# Patient Record
Sex: Female | Born: 1955
Health system: Southern US, Community
[De-identification: ages and names within clinical notes are randomized; demographics above are authoritative.]

## PROBLEM LIST (undated history)

## (undated) DIAGNOSIS — L719 Rosacea, unspecified: Secondary | ICD-10-CM

## (undated) DIAGNOSIS — Z8781 Personal history of (healed) traumatic fracture: Secondary | ICD-10-CM

## (undated) DIAGNOSIS — E669 Obesity, unspecified: Secondary | ICD-10-CM

## (undated) DIAGNOSIS — R011 Cardiac murmur, unspecified: Secondary | ICD-10-CM

## (undated) DIAGNOSIS — R7309 Other abnormal glucose: Secondary | ICD-10-CM

## (undated) DIAGNOSIS — M109 Gout, unspecified: Secondary | ICD-10-CM

## (undated) DIAGNOSIS — M7580 Other shoulder lesions, unspecified shoulder: Secondary | ICD-10-CM

## (undated) DIAGNOSIS — F419 Anxiety disorder, unspecified: Secondary | ICD-10-CM

## (undated) DIAGNOSIS — Z9889 Other specified postprocedural states: Secondary | ICD-10-CM

## (undated) DIAGNOSIS — Z8619 Personal history of other infectious and parasitic diseases: Secondary | ICD-10-CM

## (undated) DIAGNOSIS — R112 Nausea with vomiting, unspecified: Secondary | ICD-10-CM

## (undated) DIAGNOSIS — K5730 Diverticulosis of large intestine without perforation or abscess without bleeding: Secondary | ICD-10-CM

## (undated) DIAGNOSIS — L4052 Psoriatic arthritis mutilans: Secondary | ICD-10-CM

## (undated) DIAGNOSIS — T4145XA Adverse effect of unspecified anesthetic, initial encounter: Secondary | ICD-10-CM

## (undated) DIAGNOSIS — M1990 Unspecified osteoarthritis, unspecified site: Secondary | ICD-10-CM

## (undated) DIAGNOSIS — R51 Headache: Secondary | ICD-10-CM

## (undated) DIAGNOSIS — Z79899 Other long term (current) drug therapy: Secondary | ICD-10-CM

## (undated) DIAGNOSIS — I10 Essential (primary) hypertension: Secondary | ICD-10-CM

## (undated) DIAGNOSIS — M722 Plantar fascial fibromatosis: Secondary | ICD-10-CM

## (undated) DIAGNOSIS — E785 Hyperlipidemia, unspecified: Secondary | ICD-10-CM

## (undated) DIAGNOSIS — Z8719 Personal history of other diseases of the digestive system: Secondary | ICD-10-CM

## (undated) DIAGNOSIS — M84374A Stress fracture, right foot, initial encounter for fracture: Secondary | ICD-10-CM

## (undated) DIAGNOSIS — L408 Other psoriasis: Secondary | ICD-10-CM

## (undated) DIAGNOSIS — K589 Irritable bowel syndrome without diarrhea: Secondary | ICD-10-CM

## (undated) DIAGNOSIS — G44009 Cluster headache syndrome, unspecified, not intractable: Secondary | ICD-10-CM

## (undated) DIAGNOSIS — M26609 Unspecified temporomandibular joint disorder, unspecified side: Secondary | ICD-10-CM

## (undated) DIAGNOSIS — B029 Zoster without complications: Secondary | ICD-10-CM

## (undated) DIAGNOSIS — T8859XA Other complications of anesthesia, initial encounter: Secondary | ICD-10-CM

## (undated) DIAGNOSIS — G43909 Migraine, unspecified, not intractable, without status migrainosus: Secondary | ICD-10-CM

## (undated) DIAGNOSIS — M7710 Lateral epicondylitis, unspecified elbow: Secondary | ICD-10-CM

## (undated) DIAGNOSIS — R519 Headache, unspecified: Secondary | ICD-10-CM

## (undated) DIAGNOSIS — E039 Hypothyroidism, unspecified: Secondary | ICD-10-CM

## (undated) DIAGNOSIS — M48062 Spinal stenosis, lumbar region with neurogenic claudication: Secondary | ICD-10-CM

## (undated) HISTORY — DX: Personal history of other infectious and parasitic diseases: Z86.19

## (undated) HISTORY — DX: Diverticulosis of large intestine without perforation or abscess without bleeding: K57.30

## (undated) HISTORY — DX: Rosacea, unspecified: L71.9

## (undated) HISTORY — DX: Plantar fascial fibromatosis: M72.2

## (undated) HISTORY — DX: Hyperlipidemia, unspecified: E78.5

## (undated) HISTORY — DX: Irritable bowel syndrome, unspecified: K58.9

## (undated) HISTORY — PX: OTHER SURGICAL HISTORY: SHX169

## (undated) HISTORY — DX: Morbid (severe) obesity due to excess calories: E66.01

## (undated) HISTORY — DX: Obesity, unspecified: E66.9

## (undated) HISTORY — PX: ESOPHAGOGASTRODUODENOSCOPY: SHX1529

## (undated) HISTORY — DX: Essential (primary) hypertension: I10

## (undated) HISTORY — DX: Other shoulder lesions, unspecified shoulder: M75.80

## (undated) HISTORY — PX: STOMACH SURGERY: SHX791

## (undated) HISTORY — PX: ABDOMINAL HYSTERECTOMY: SHX81

## (undated) HISTORY — PX: CERVICAL DISC SURGERY: SHX588

## (undated) HISTORY — DX: Unspecified osteoarthritis, unspecified site: M19.90

## (undated) HISTORY — DX: Other abnormal glucose: R73.09

## (undated) HISTORY — DX: Unspecified temporomandibular joint disorder, unspecified side: M26.609

## (undated) HISTORY — PX: CARPAL TUNNEL RELEASE: SHX101

## (undated) HISTORY — DX: Gout, unspecified: M10.9

## (undated) HISTORY — DX: Other psoriasis: L40.8

## (undated) HISTORY — PX: CHOLECYSTECTOMY: SHX55

## (undated) HISTORY — DX: Cardiac murmur, unspecified: R01.1

## (undated) HISTORY — PX: OVARIAN CYST REMOVAL: SHX89

---

## 1996-09-25 LAB — HM SIGMOIDOSCOPY: HM Sigmoidoscopy: NORMAL

## 2002-09-25 HISTORY — PX: OOPHORECTOMY: SHX86

## 2003-09-26 HISTORY — PX: OTHER SURGICAL HISTORY: SHX169

## 2004-04-14 ENCOUNTER — Ambulatory Visit (HOSPITAL_COMMUNITY): Admission: RE | Admit: 2004-04-14 | Discharge: 2004-04-15 | Payer: Self-pay | Admitting: Neurological Surgery

## 2004-05-17 ENCOUNTER — Encounter: Admission: RE | Admit: 2004-05-17 | Discharge: 2004-05-17 | Payer: Self-pay | Admitting: Neurological Surgery

## 2004-07-18 ENCOUNTER — Encounter: Admission: RE | Admit: 2004-07-18 | Discharge: 2004-07-18 | Payer: Self-pay | Admitting: Neurological Surgery

## 2004-08-22 ENCOUNTER — Ambulatory Visit: Payer: Self-pay | Admitting: Obstetrics and Gynecology

## 2004-09-25 HISTORY — PX: NECK SURGERY: SHX720

## 2005-07-26 HISTORY — PX: LAPAROSCOPIC SUPRACERVICAL HYSTERECTOMY: SUR797

## 2005-08-09 ENCOUNTER — Other Ambulatory Visit: Payer: Self-pay

## 2005-08-14 ENCOUNTER — Ambulatory Visit: Payer: Self-pay | Admitting: Obstetrics and Gynecology

## 2005-12-20 ENCOUNTER — Ambulatory Visit: Payer: Self-pay | Admitting: Internal Medicine

## 2006-11-24 LAB — CONVERTED CEMR LAB: Pap Smear: NORMAL

## 2006-11-24 LAB — HM MAMMOGRAPHY: HM Mammogram: NORMAL

## 2006-12-28 ENCOUNTER — Encounter: Payer: Self-pay | Admitting: Family Medicine

## 2007-01-10 ENCOUNTER — Ambulatory Visit: Payer: Self-pay | Admitting: Obstetrics and Gynecology

## 2007-02-12 ENCOUNTER — Ambulatory Visit: Payer: Self-pay | Admitting: Family Medicine

## 2007-02-12 DIAGNOSIS — E785 Hyperlipidemia, unspecified: Secondary | ICD-10-CM | POA: Insufficient documentation

## 2007-02-12 DIAGNOSIS — E669 Obesity, unspecified: Secondary | ICD-10-CM

## 2007-02-12 DIAGNOSIS — H209 Unspecified iridocyclitis: Secondary | ICD-10-CM

## 2007-02-12 DIAGNOSIS — K5730 Diverticulosis of large intestine without perforation or abscess without bleeding: Secondary | ICD-10-CM | POA: Insufficient documentation

## 2007-02-12 DIAGNOSIS — L408 Other psoriasis: Secondary | ICD-10-CM | POA: Insufficient documentation

## 2007-02-12 DIAGNOSIS — K589 Irritable bowel syndrome without diarrhea: Secondary | ICD-10-CM

## 2007-02-12 DIAGNOSIS — L719 Rosacea, unspecified: Secondary | ICD-10-CM

## 2007-02-12 DIAGNOSIS — I10 Essential (primary) hypertension: Secondary | ICD-10-CM | POA: Insufficient documentation

## 2007-02-12 DIAGNOSIS — M1990 Unspecified osteoarthritis, unspecified site: Secondary | ICD-10-CM | POA: Insufficient documentation

## 2007-03-14 ENCOUNTER — Ambulatory Visit: Payer: Self-pay | Admitting: Family Medicine

## 2007-05-30 ENCOUNTER — Telehealth (INDEPENDENT_AMBULATORY_CARE_PROVIDER_SITE_OTHER): Payer: Self-pay | Admitting: *Deleted

## 2007-06-05 ENCOUNTER — Telehealth (INDEPENDENT_AMBULATORY_CARE_PROVIDER_SITE_OTHER): Payer: Self-pay | Admitting: *Deleted

## 2008-01-29 ENCOUNTER — Encounter: Payer: Self-pay | Admitting: Family Medicine

## 2008-02-18 ENCOUNTER — Ambulatory Visit: Payer: Self-pay | Admitting: Obstetrics and Gynecology

## 2008-04-29 ENCOUNTER — Ambulatory Visit: Payer: Self-pay | Admitting: Family Medicine

## 2008-05-07 ENCOUNTER — Encounter: Payer: Self-pay | Admitting: Family Medicine

## 2008-05-07 ENCOUNTER — Telehealth (INDEPENDENT_AMBULATORY_CARE_PROVIDER_SITE_OTHER): Payer: Self-pay | Admitting: *Deleted

## 2008-05-13 ENCOUNTER — Ambulatory Visit: Payer: Self-pay | Admitting: Family Medicine

## 2008-05-14 ENCOUNTER — Encounter: Payer: Self-pay | Admitting: Family Medicine

## 2008-06-30 ENCOUNTER — Ambulatory Visit: Payer: Self-pay | Admitting: Family Medicine

## 2008-07-07 ENCOUNTER — Encounter: Payer: Self-pay | Admitting: Family Medicine

## 2008-07-13 ENCOUNTER — Telehealth: Payer: Self-pay | Admitting: Family Medicine

## 2008-07-13 DIAGNOSIS — M79609 Pain in unspecified limb: Secondary | ICD-10-CM

## 2008-09-23 ENCOUNTER — Ambulatory Visit: Payer: Self-pay | Admitting: Family Medicine

## 2008-10-06 ENCOUNTER — Telehealth: Payer: Self-pay | Admitting: Family Medicine

## 2008-10-07 ENCOUNTER — Encounter: Payer: Self-pay | Admitting: Family Medicine

## 2009-03-01 ENCOUNTER — Ambulatory Visit: Payer: Self-pay | Admitting: Obstetrics and Gynecology

## 2009-03-04 ENCOUNTER — Telehealth: Payer: Self-pay | Admitting: Family Medicine

## 2009-05-28 ENCOUNTER — Ambulatory Visit: Payer: Self-pay | Admitting: Unknown Physician Specialty

## 2009-07-07 ENCOUNTER — Ambulatory Visit: Payer: Self-pay | Admitting: Family Medicine

## 2009-07-07 DIAGNOSIS — R739 Hyperglycemia, unspecified: Secondary | ICD-10-CM

## 2009-07-07 LAB — HM DIABETES FOOT EXAM

## 2009-09-08 ENCOUNTER — Ambulatory Visit: Payer: Self-pay | Admitting: Family Medicine

## 2009-09-13 ENCOUNTER — Telehealth: Payer: Self-pay | Admitting: Family Medicine

## 2009-10-21 ENCOUNTER — Telehealth: Payer: Self-pay | Admitting: Family Medicine

## 2010-01-03 ENCOUNTER — Encounter: Payer: Self-pay | Admitting: Family Medicine

## 2010-03-02 ENCOUNTER — Ambulatory Visit: Payer: Self-pay | Admitting: Obstetrics and Gynecology

## 2010-03-09 ENCOUNTER — Ambulatory Visit: Payer: Self-pay | Admitting: Family Medicine

## 2010-03-09 DIAGNOSIS — M109 Gout, unspecified: Secondary | ICD-10-CM

## 2010-03-15 ENCOUNTER — Encounter: Payer: Self-pay | Admitting: Family Medicine

## 2010-03-21 ENCOUNTER — Encounter: Payer: Self-pay | Admitting: Family Medicine

## 2010-04-06 ENCOUNTER — Ambulatory Visit: Payer: Self-pay | Admitting: Family Medicine

## 2010-04-22 ENCOUNTER — Ambulatory Visit: Payer: Self-pay | Admitting: Family Medicine

## 2010-07-28 ENCOUNTER — Ambulatory Visit: Payer: Self-pay | Admitting: Family Medicine

## 2010-07-28 DIAGNOSIS — J0100 Acute maxillary sinusitis, unspecified: Secondary | ICD-10-CM | POA: Insufficient documentation

## 2010-10-18 ENCOUNTER — Encounter: Payer: Self-pay | Admitting: Family Medicine

## 2010-10-26 NOTE — Assessment & Plan Note (Signed)
Summary: 6 MTH FU/CLE   Vital Signs:  Patient profile:   55 year old female Height:      64 inches Weight:      324.75 pounds BMI:     55.94 Temp:     97 degrees F oral Pulse rate:   76 / minute Pulse rhythm:   regular BP sitting:   118 / 78  (right arm) Cuff size:   large  Vitals Entered By: Lewanda Rife LPN (March 09, 2010 9:02 AM) CC: six month f/u   History of Present Illness: here for f/u of HTN and lipids and DM  has been doing fairly overall   was dx with gout in foot  was on prednisone and then indometh (with prilosec)- takes it on and off and sees Dr Gavin Potters today had another bout of foot pain and it was gout plus a stress fx  has OA and psoriatic arthritis   is on aldactone  uric acid - per pt was on "high side of nl )     wt is down 3 lb with bmi of 55  (had lost 8 and then gained back being immobile)  is getting back to the pool this week- that feels good   bp is 118/78  DM - consistently has AIC under 6   lipids due for check/ pt ref statin due to leg pain in past so not at goal for DM   Allergies: 1)  * Amitriptyline 2)  * Midrin  Past History:  Past Surgical History: Last updated: 02/12/2007 [facility]  supra cervical hyst 11/06 (fibroid tumor, and ovarian cyst)- one ovary left and cervix left c-s surgery 05 carpal tunnel (bilat)R oophrectomy 04 stomach sx years ago for wt loss, ruptured incision EGD 9 years ago, had esoph stricture, and swallowing study  Family History: Last updated: 04/29/2008 mother- arthritis, fibromyalgia, severe lymphedema, obese brother obese  father MI at 65, CAD, ?chol M aunt with ovarian cancer P aunt with breast ca all P uncles with CAD GF CVA HTN in a lot of family members GM, B DM  Social History: Last updated: 04/29/2008 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 Eyes:  Denies blurring and eye pain. CV:  Denies chest pain or discomfort, lightheadness, and palpitations. Resp:  Denies cough and wheezing. GI:  Denies abdominal pain, change in bowel habits, indigestion, and nausea. GU:  Denies dysuria and urinary frequency. MS:  Denies joint pain, joint redness, and joint swelling. Derm:  Denies lesion(s), poor wound healing, and rash. Neuro:  Denies numbness and tingling. Psych:  Denies anxiety and depression. Endo:  Denies excessive thirst and excessive urination. Heme:  Denies abnormal bruising and bleeding.  Physical Exam  General:  morbidly obese- but well appearing  Head:  normocephalic, atraumatic, and no abnormalities observed.   Eyes:  vision grossly intact, pupils equal, pupils round, and pupils reactive to light.   Mouth:  pharynx pink and moist.   Neck:  supple with full rom and no masses or thyromegally, no JVD or carotid bruit  Lungs:  Normal respiratory effort, chest expands symmetrically.  Lungs are clear to auscultation, no crackles or wheezes. Heart:  Normal rate and regular rhythm. S1 and S2 normal without gallop, murmur, click, rub or other extra sounds. Msk:  no swelling in R foot today poor rom knees  Pulses:  R and L carotid,radial,femoral,dorsalis pedis and posterior tibial pulses are full and equal bilaterally Extremities:  No clubbing, cyanosis, edema, or deformity noted with normal full range of motion of all joints.   Neurologic:  sensation intact to light touch, gait normal, and DTRs symmetrical and normal.   Skin:  Intact without suspicious lesions or rashes Cervical Nodes:  No lymphadenopathy noted Psych:  normal affect, talkative and pleasant    Impression &  Recommendations:  Problem # 1:  HYPERGLYCEMIA (ICD-790.29) Assessment Unchanged check AIC - may be up a bit due to prednisone  has been well controlled commended wt loss order for [facility] given disc healthy diet (low simple sugar/ choose complex carbs/ low sat fat) diet and exercise in detail  f/u 6 mo  Problem # 2:  HYPERLIPIDEMIA NEC/NOS (ICD-272.4) Assessment: Unchanged pt declines stating rev low sat fat diet  pt refuses to stop eating red meat- disc imp of this  labs ordered f/u 6 mo   Problem # 3:  HYPERTENSION, ESSENTIAL NOS (ICD-401.9) Assessment: Unchanged  good control  if she has to stop med for gout - will need to make another choice will disc with her rheum today  lab order given f/u 6 mo  Her updated medication list for this problem includes:    Aldactone 25 Mg Tabs (Spironolactone) .Marland Kitchen... 1 by mouth once daily  BP today: 118/78 Prior BP: 140/80 (09/08/2009)  Prior 10 Yr Risk Heart Disease: Not enough information (07/07/2009)  Problem # 4:  GOUT, UNSPECIFIED (ICD-274.9) Assessment: New recurrent in foot  takes indocin cautiously with prilosec for rheum f/u today may need to stop diuretic / may need proph agent for uric acid pt given handouts for gout and low purine diet today from [facility]   Complete Medication List: 1)  Doxycycline Hyclate 50 Mg Caps (Doxycycline hyclate) .... Take one by mouth two times a day as needed 2)  Clidinium-chlordiazepoxide Caps (Clidinium-chlordiazepoxide caps) .... Take one by mouth two times a day as needed , 5-2.5 mg 3)  Glucosamine-chondroitin Tabs (Glucosamine-chondroit-vit c-mn) .... Take by mouth as directed 4)  Temovate 0.05 % Crea (Clobetasol propionate) .... Daily to aff area as needed 5)  Aldactone 25 Mg Tabs (Spironolactone) .Marland Kitchen.. 1 by mouth once daily 6)  Glucometer  .... To check glucose once daily and as needed for diabetes 250.0 7)  Glucose Test Strips and Lancets  .... To check glucose once daily and as needed for  diabetes 250.0 8)  Alprazolam 0.5 Mg Tabs (Alprazolam) .... 1/2 to 1 by mouth up to two times a day as needed anxiety 9)  Cyclobenzaprine Hcl 10 Mg Tabs (Cyclobenzaprine hcl) .... Take one by mouth as needed 10)  Ultram 50 Mg Tabs (Tramadol hcl) .... Take 2 by mouth every 6 hours 11)  Aspirin 81 Mg Tbec (Aspirin) .Marland Kitchen.. 1 by mouth once daily with food 12)  Indomethasone ?mg  .... Take two by mouth daily 13)  Prilosec ?mg  .... Take 1 tablet by mouth once a day  Patient Instructions: 1)  ask Dr Gavin Potters if you need to stop your diuretic (fluid pill )-- spironolactone  2)  here is order for labs for [facility]  3)  I will update you when these come back  4)  keep working on healthy diet and exercise 5)  follow up with me in about 6 months  Prescriptions: ALPRAZOLAM 0.5 MG TABS (ALPRAZOLAM) 1/2 to 1 by mouth up to two times a day as needed anxiety  #30 x 0   Entered and Authorized by:   Judith Part MD   Signed by:   Judith Part MD on 03/09/2010   Method used:   Print then Give to Patient   RxID:   [id number]   Current Allergies (reviewed today): * AMITRIPTYLINE * MIDRIN

## 2010-10-26 NOTE — Miscellaneous (Signed)
Summary: Controlled Substance Agreement  Controlled Substance Agreement   Imported By: Lanelle Bal 03/18/2010 09:48:29  _____________________________________________________________________  External Attachment:    Type:   Image     Comment:   External Document

## 2010-10-26 NOTE — Assessment & Plan Note (Signed)
Summary: BLOOD PRESSURE CHECK/Miron Marxen  Nurse Visit   Vital Signs:  Patient profile:   55 year old female Temp:     97.9 degrees F Pulse rate:   76 / minute Pulse rhythm:   regular BP sitting:   138 / 96  (right arm) Cuff size:   regular  Vitals Entered By: Lewanda Rife LPN (April 06, 2010 8:50 AM) CC: Patient present today for blood pressure check at the request of the physician. Comments Relevant Hx: Patient present today for blood pressure check.  Rheumotologist stopped Spirolactone two weeks ago. Pt is not taking any med for BP. Pt has no complaints. BP initially was 144/94 regular cuff on lower arm and after waiting 10 mins rechecked BP and BP was 138/96 regular cuff lower arm. Rechecked BP with extra large cuff on upper rt arm BP was 134/96. Please advise. Lewanda Rife LPN  April 06, 2010 9:00 AM   Assessment and Plan:  Dr Milinda Antis said OK for pt to leave and send note to Dr Milinda Antis. Pt can be reached at [phone].Lewanda Rife LPN  April 06, 2010 9:13 AM   thanks for the update - I want to get her started on lisinopril for bp  px written on EMR for call in  if any side eff like dizziness or cough - let me know  plan another nurse visit for 2 weeks to check bp please 401.9  Patient notified as instructed by telephone. Medication phoned to [facility] as instructed. Nurse visit for BP check 401.9 scheduled 04/22/10 at 8:30am.[facility] LPN  April 06, 2010 5:15 PM   Allergies: 1)  ! Spironolactone 2)  * Amitriptyline 3)  * Midrin  Orders Added: 1)  Est. Patient Level I [65784] Prescriptions: LISINOPRIL 10 MG TABS (LISINOPRIL) 1 by mouth once daily in am  #30 x 11   Entered and Authorized by:   Judith Part MD   Signed by:   Lewanda Rife LPN on 69/62/9528   Method used:   Telephoned to ...       [facility]  [address] [phone] ([facility]ail[facility]       [address]       [place], [place]  [address]       Ph: [phone] or [phone]       Fax: [phone]   RxID:    [id number]   Appended Document: BLOOD PRESSURE CHECK/Shawnie Nicole Dr Milinda Antis Pt request a 3 month prescription with refills if she is going to stay on med she said it is much cheaper for her to get mail order. Pt request rx mailed to her home address. Pt did not want to wait until BP check for rx so she could get 3 month supply before this month called in to [facility] is gone.  Appended Document: BLOOD PRESSURE CHECK/Jamiaya Bina remind her there is always the risk if she gets 3 mo that we may still have to adjust or change it (hopefully not) printed in put in nurse in box for pickup  Patient notified as instructed by telephone. Pt said she would pick up rx when she comes for BP check on 04/22/10. Prescription left at front desk. Lewanda Rife LPN  April 07, 2010 2:39 PM   Clinical Lists Changes  Medications: Rx of LISINOPRIL 10 MG TABS (LISINOPRIL) 1 by mouth once daily in am;  #90 x 3;  Signed;  Entered by: [employer]  MD;  Authorized by: Judith Part MD;  Method used: Print then Give to Patient    Prescriptions: LISINOPRIL 10 MG TABS (LISINOPRIL) 1 by mouth once daily in am  #90 x 3   Entered and Authorized by:   Judith Part MD   Signed by:   Judith Part MD on 04/07/2010   Method used:   Print then Give to Patient   RxID:   [id number]

## 2010-10-26 NOTE — Progress Notes (Signed)
Summary: Rx for congestion and cough  Phone Note Call from Patient Call back at Work Phone [phone]   Caller: Patient Call For: Judith Part MD Summary of Call: Patient called stating that she would like someting called in if possible.  Had head and chest congestion about three weeks ago, and it has not completely gone away.  Has been taking Tylenol and Mucinex.  Going out of town in about a week and cannot be sick on this trip.  Symptoms not getting any better, but no worse either.  Doen't want this to turn into a sinus infection.  Please advise.  Uses [facility] Initial call taken by: Linde Gillis CMA Duncan Dull),  October 21, 2009 11:43 AM  Follow-up for Phone Call        I recommend mucinex (DM if cough)  nasal saline irrigation or netti pot  tylenol if fever or sinus pain if not improving - needs to be seen - esp if facial pain or fever   Follow-up by: Judith Part MD,  October 21, 2009 12:20 PM  Additional Follow-up for Phone Call Additional follow up Details #1::        Advised pt. Additional Follow-up by: Lowella Petties CMA,  October 21, 2009 12:32 PM

## 2010-10-26 NOTE — Assessment & Plan Note (Signed)
Summary: BP CHECK NURSE VISIT PER DR [id number]/WU  Nurse Visit   Vital Signs:  Patient profile:   55 year old female Temp:     97.5 degrees F oral Pulse rate:   68 / minute Pulse rhythm:   regular BP sitting:   124 / 82  (right arm) Cuff size:   regular  Vitals Entered By: Lewanda Rife LPN (April 22, 2010 9:11 AM) CC: Patient present today for blood pressure check at the request of Dr. Milinda Antis Comments Pt states she feels good and is having no side effects (no dizziness and no cough). No other complaints noted. Pt has been taking Lisinopril 10mg  once daily as instructed. Pt can be reached at [phone] with further inistructions.Please advise.    Allergies: 1)  ! Spironolactone 2)  * Amitriptyline 3)  * Midrin  Orders Added: 1)  Est. Patient Level I [95621]  Appended Document: BP CHECK NURSE VISIT PER DR [id number]/NG please adv her to continue that med- bp is good  Appended Document: BP CHECK NURSE VISIT PER DR EXBMW413.2/GM Patient Advised.

## 2010-10-26 NOTE — Miscellaneous (Signed)
Summary: update med list  Clinical Lists Changes  Medications: Removed medication of ALDACTONE 25 MG TABS (SPIRONOLACTONE) 1 by mouth once daily     Current Allergies: * AMITRIPTYLINE * MIDRIN

## 2010-10-26 NOTE — Letter (Signed)
Summary: [facility]  [facility]   Imported By: Lanelle Bal 03/18/2010 10:49:15  _____________________________________________________________________  External Attachment:    Type:   Image     Comment:   External Document  Appended Document: [facility] please let pt know I read her rheum note and the doctor wanted her to stop her spironolactone due to gout - please note this on EMR med list and have her please come in for bp check nurse visit in 2 weeks -- to see how bp is and decide what to do next   Appended Document: [facility] Patient notified as instructed by telephone. Med list updated. pt is at home and will call tomorrow when she has her schedule at work to schedule nurse visit in 2 weks.

## 2010-10-26 NOTE — Letter (Signed)
Summary: Letter Regarding Disease Mgmt Program/[employer] [facility]  Letter Regarding Disease Mgmt Program/[employer] [facility]   Imported By: Lanelle Bal 01/13/2010 08:30:39  _____________________________________________________________________  External Attachment:    Type:   Image     Comment:   External Document

## 2010-10-26 NOTE — Assessment & Plan Note (Signed)
Summary: ??sinus infection/alc   Vital Signs:  Patient profile:   55 year old female Height:      64 inches Weight:      343.25 pounds BMI:     59.13 Temp:     97.6 degrees F oral Pulse rate:   84 / minute Pulse rhythm:   regular BP sitting:   142 / 88  (left arm) Cuff size:   large  Vitals Entered By: Delilah Shan CMA Duncan Dull) (July 28, 2010 2:41 PM) CC: ? sinus infection, coughing   History of Present Illness: Start as a cold about 1 week ago.  Now with increase in ear "pulling", cough, congestion and "things settling in back on my throat."   No FCNAV.  Occ hot flashes as baseline.  Some sputum production, likely from post nasal gtt.  No doxy use recently (for rosacea).  Mult sick contacts.    Current Medications (verified): 1)  Doxycycline Hyclate 50 Mg Caps (Doxycycline Hyclate) .... Take One By Mouth Two Times A Day As Needed 2)  Clidinium-Chlordiazepoxide  Caps (Clidinium-Chlordiazepoxide Caps) .... Take One By Mouth Two Times A Day As Needed , 5-2.5 Mg 3)  Glucosamine-Chondroitin  Tabs (Glucosamine-Chondroit-Vit C-Mn) .... Take By Mouth As Directed 4)  Temovate 0.05 % Crea (Clobetasol Propionate) .... Daily To Aff Area As Needed 5)  Glucometer .... To Check Glucose Once Daily and As Needed For Diabetes 250.0 6)  Glucose Test Strips and Lancets .... To Check Glucose Once Daily and As Needed For Diabetes 250.0 7)  Alprazolam 0.5 Mg Tabs (Alprazolam) .... 1/2 To 1 By Mouth Up To Two Times A Day As Needed Anxiety 8)  Cyclobenzaprine Hcl 10 Mg Tabs (Cyclobenzaprine Hcl) .... Take One By Mouth As Needed 9)  Ultram 50 Mg Tabs (Tramadol Hcl) .... Take 2 By Mouth Every 6 Hours 10)  Aspirin 81 Mg Tbec (Aspirin) .Marland Kitchen.. 1 By Mouth Once Daily With Food 11)  Indomethasone ?mg .... Take Two By Mouth Daily 12)  Prilosec ?mg .... Take 1 Tablet By Mouth Once A Day 13)  Lisinopril 10 Mg Tabs (Lisinopril) .Marland Kitchen.. 1 By Mouth Once Daily in Am  Allergies: 1)  ! Spironolactone 2)  *  Amitriptyline 3)  * Midrin  Review of Systems       See HPI.  Otherwise negative.    Physical Exam  General:  GEN: nad, alert and oriented, obese HEENT: mucous membranes moist, TM w/o erythema, nasal epithelium injected, OP with cobblestoning, maxillary sinus tender to palpation bilaterally NECK: supple w/o LA CV: rrr. PULM: ctab, no inc wob ABD: soft, +bs EXT: no edema    Impression & Recommendations:  Problem # 1:  ACUTE MAXILLARY SINUSITIS (ICD-461.0) See instruction.  follow up as needed.  Her updated medication list for this problem includes:    Doxycycline Hyclate 50 Mg Caps (Doxycycline hyclate) .Marland Kitchen... Take one by mouth two times a day as needed    Amoxicillin 875 Mg Tabs (Amoxicillin) .Marland Kitchen... 1 by mouth two times a day  Complete Medication List: 1)  Doxycycline Hyclate 50 Mg Caps (Doxycycline hyclate) .... Take one by mouth two times a day as needed 2)  Clidinium-chlordiazepoxide Caps (Clidinium-chlordiazepoxide caps) .... Take one by mouth two times a day as needed , 5-2.5 mg 3)  Glucosamine-chondroitin Tabs (Glucosamine-chondroit-vit c-mn) .... Take by mouth as directed 4)  Temovate 0.05 % Crea (Clobetasol propionate) .... Daily to aff area as needed 5)  Glucometer  .... To check glucose once daily and  as needed for diabetes 250.0 6)  Glucose Test Strips and Lancets  .... To check glucose once daily and as needed for diabetes 250.0 7)  Alprazolam 0.5 Mg Tabs (Alprazolam) .... 1/2 to 1 by mouth up to two times a day as needed anxiety 8)  Cyclobenzaprine Hcl 10 Mg Tabs (Cyclobenzaprine hcl) .... Take one by mouth as needed 9)  Ultram 50 Mg Tabs (Tramadol hcl) .... Take 2 by mouth every 6 hours 10)  Aspirin 81 Mg Tbec (Aspirin) .Marland Kitchen.. 1 by mouth once daily with food 11)  Indomethasone ?mg  .... Take two by mouth daily 12)  Prilosec ?mg  .... Take 1 tablet by mouth once a day 13)  Lisinopril 10 Mg Tabs (Lisinopril) .Marland Kitchen.. 1 by mouth once daily in am 14)  Amoxicillin 875 Mg Tabs  (Amoxicillin) .Marland Kitchen.. 1 by mouth two times a day  Patient Instructions: 1)  I would use nasal saline and gargle with salt water.  Rest and take tylenol as needed.  Start the amoxicillin and hold the doxycycline in the meatnime.  This should gradually get better.  Take care.  Prescriptions: AMOXICILLIN 875 MG TABS (AMOXICILLIN) 1 by mouth two times a day  #20 x 0   Entered and Authorized by:   Crawford Givens MD   Signed by:   Crawford Givens MD on 07/28/2010   Method used:   [facility]        [facility]  [address] [phone][facility] (retail)       [address]       [place], [place]  [address]       Ph: [phone] or [phone]       Fax: [phone]   RxID:   [id number]    Orders Added: 1)  Est. Patient Level III [63875]    Current Allergies (reviewed today): ! SPIRONOLACTONE * AMITRIPTYLINE * MIDRIN

## 2010-11-01 ENCOUNTER — Encounter: Payer: Self-pay | Admitting: Family Medicine

## 2010-11-10 NOTE — Letter (Signed)
Summary: [facility] note  External Other   Imported By: Kassie Mends 10/28/2010 11:51:11  _____________________________________________________________________  External Attachment:    Type:   Image     Comment:   External Document

## 2010-11-22 NOTE — Letter (Signed)
Summary: [facility]   [facility]   Imported By: Kassie Mends 11/15/2010 10:22:14  _____________________________________________________________________  External Attachment:    Type:   Image     Comment:   External Document

## 2011-02-02 ENCOUNTER — Other Ambulatory Visit: Payer: Self-pay | Admitting: *Deleted

## 2011-02-02 MED ORDER — LISINOPRIL 10 MG PO TABS: 10.0000 mg | ORAL_TABLET | Freq: Every day | ORAL | Status: DC

## 2011-02-08 ENCOUNTER — Telehealth: Payer: Self-pay | Admitting: *Deleted

## 2011-02-08 NOTE — Telephone Encounter (Signed)
Letter done and in IN box 

## 2011-02-08 NOTE — Telephone Encounter (Signed)
Pt had fallen at work and had to take a drug test.  It showed positive for benzodiazepine.  Pt takes xanax. She is asking that you write a letter explaining that she has been prescribed this medicine.  She needs this asap, supposed to turn it in within 24 hours.

## 2011-02-08 NOTE — Telephone Encounter (Signed)
/  Patient notified as instructed by telephone. Pt said to fax to secure line at [phone] on 02/09/11 Thursday after 8 am. Pt will get the fax. If pt does not receive by 10am pt will call me back to let me know.

## 2011-02-09 NOTE — Telephone Encounter (Signed)
Spoke with pt and now is a good time to fax letter to [phone]. Letter faxed.

## 2011-02-10 NOTE — Op Note (Signed)
NAME:  Michelle Jordan, Michelle Jordan                         ACCOUNT NO.:  [id number]   MEDICAL RECORD NO.:  [id number]                   PATIENT TYPE:  OIB   LOCATION:  NA                                   FACILITY:  [facility]   PHYSICIAN:  Tia Alert, MD                  DATE OF BIRTH:  [birth year]   DATE OF PROCEDURE:  04/14/2004  DATE OF DISCHARGE:                                 OPERATIVE REPORT   PREOPERATIVE DIAGNOSIS:  Cervical spondylosis C5-6 with right C6  radiculopathy.   POSTOPERATIVE DIAGNOSIS:  Cervical spondylosis C5-6 with right C6  radiculopathy.   PROCEDURE:  Decompressive anterior cervical diskectomy for central canal  nerve root decompression.  Anterior cervical arthrodesis C5-6 utilizing a 7  mm Toutigen allograft.  Anterior cervical plating C5-6 utilizing a 22.5 mm  Zephyr anterior cervical plate.   SURGEON:  Tia Alert, M.D.   ASSISTANT:  Reinaldo Meeker, M.D.   ANESTHESIA:  General endotracheal.   COMPLICATIONS:  None apparent.   INDICATIONS FOR PROCEDURE:  The patient is a 55 year old white female who  presented to the neurosurgery clinic with complaints of neck pain with right  arm pain in a C6 distribution.  She had an MRI and a CT scan which showed  cervical spondylosis at C5-6 with narrowing of the right neuroforamen.  She  tried medical management for quite some time without significant relief.  I  recommended anterior cervical diskectomy and fusion with plating.  She  understood the risks, benefits, and alternatives and wished to proceed.   DESCRIPTION OF PROCEDURE:  The patient was taken to the operating room and  after induction of adequate general endotracheal anesthesia, she was placed  in a supine position on the operating table.  Her right anterior cervical  region was prepped with DuraPrep and draped in the usual sterile fashion.  3  mL of local anesthesia was injected and then an incision was made to the  right of the midline and carried down  to the platysma which was elevated,  opened, and undermined with Metzenbaum scissors and then dissected in a  plane medial to the sternocleidomastoid muscle and internal carotid artery  and lateral to the trachea and esophagus to expose the anterior cervical  spine.  Intraoperative fluoroscopy confirmed the level at C5-6 and then the  longus coli muscles were taken down bilaterally and the retractors were  placed under these to expose the anterior cervical spine at C5-6.  The  annulus was incised and the initial diskectomy was done with pituitary  rongeurs and curets.  Once we got down to the level of the posterior  longitudinal ligament, the operating microscope was brought into the field  and this was opened, and the posterior longitudinal ligament and posterior  osteophytes were removed in a circumferential fashion with the Kerrison  punches.  Bilateral foraminotomies were performed.  Because of right-sided  symptoms, we spent extra time on the neuroforamen and found the right C6  nerve root and followed it out into the foramen for quite some distance.  Once the decompression was complete, we dried bleeding in the surgical bed  with thrombin-soaked Gelfoam.  We then measured the interspace to be 7 mm  and used a 7 mm Toutigen allograft and tapped this into position in the  interspace at C5-6.  We then used a 22.5 mm Zephyr plate, placed two 13 mm  screws in the body of C5 and two in the body of C6 and locked these into  position with a sliding mechanism.  We then irrigated with saline solution  which contained bacitracin, dried all bleeding points with bipolar cautery  and once meticulous hemostasis was achieved, we closed the platysma with  interrupted 3-0 Vicryl, closed the subcuticular tissue with 3-0 Vicryl, and  closed the skin with Dermabond.  The drapes were removed, a sterile dressing  was applied.  The patient was awakened from general anesthesia and  transported to the recovery  room in stable condition.  At the end of the  procedure, all needle, sponge, and instrument counts correct.                                               Tia Alert, MD    DSJ/MEDQ  D:  04/14/2004  T:  04/14/2004  Job:  [id number]

## 2011-02-18 ENCOUNTER — Telehealth: Payer: Self-pay

## 2011-02-18 NOTE — Telephone Encounter (Signed)
Just for Dr Royden Purl information; pt did not want call back. Pt left v/m that the Benzo class false positive was caused by Chlordiaz for IBS. Thank you.

## 2011-03-30 ENCOUNTER — Ambulatory Visit: Payer: Self-pay | Admitting: Obstetrics and Gynecology

## 2011-07-04 ENCOUNTER — Other Ambulatory Visit: Payer: Self-pay | Admitting: Family Medicine

## 2011-07-27 ENCOUNTER — Encounter: Payer: Self-pay | Admitting: Family Medicine

## 2011-07-28 ENCOUNTER — Ambulatory Visit (INDEPENDENT_AMBULATORY_CARE_PROVIDER_SITE_OTHER): Payer: 59 | Admitting: Family Medicine

## 2011-07-28 ENCOUNTER — Encounter: Payer: Self-pay | Admitting: Family Medicine

## 2011-07-28 DIAGNOSIS — F411 Generalized anxiety disorder: Secondary | ICD-10-CM

## 2011-07-28 DIAGNOSIS — D179 Benign lipomatous neoplasm, unspecified: Secondary | ICD-10-CM | POA: Insufficient documentation

## 2011-07-28 DIAGNOSIS — R7309 Other abnormal glucose: Secondary | ICD-10-CM

## 2011-07-28 DIAGNOSIS — E785 Hyperlipidemia, unspecified: Secondary | ICD-10-CM

## 2011-07-28 DIAGNOSIS — F419 Anxiety disorder, unspecified: Secondary | ICD-10-CM | POA: Insufficient documentation

## 2011-07-28 DIAGNOSIS — L719 Rosacea, unspecified: Secondary | ICD-10-CM

## 2011-07-28 DIAGNOSIS — I10 Essential (primary) hypertension: Secondary | ICD-10-CM

## 2011-07-28 MED ORDER — PROMETHAZINE HCL 25 MG PO TABS: 25.0000 mg | ORAL_TABLET | Freq: Four times a day (QID) | ORAL | Status: DC | PRN

## 2011-07-28 MED ORDER — ALPRAZOLAM 0.5 MG PO TABS: 0.2500 mg | ORAL_TABLET | Freq: Two times a day (BID) | ORAL | Status: DC | PRN

## 2011-07-28 MED ORDER — LISINOPRIL 10 MG PO TABS: 10.0000 mg | ORAL_TABLET | Freq: Every day | ORAL | Status: DC

## 2011-07-28 MED ORDER — DOXYCYCLINE HYCLATE 50 MG PO CAPS: 50.0000 mg | ORAL_CAPSULE | Freq: Two times a day (BID) | ORAL | Status: DC | PRN

## 2011-07-28 MED ORDER — CILIDINIUM-CHLORDIAZEPOXIDE 2.5-5 MG PO CAPS: 1.0000 | ORAL_CAPSULE | Freq: Two times a day (BID) | ORAL | Status: DC | PRN

## 2011-07-28 NOTE — Patient Instructions (Addendum)
We will do a general surgeon referral at check out for area on neck  Keep working on weight loss and good exercise  meds refilled  I'm glad you got your flu shot

## 2011-07-28 NOTE — Progress Notes (Signed)
Subjective:    Patient ID: Michelle Jordan, female    DOB: December 13, 1955, 55 y.o.   MRN: [id number]  HPI Here for f/u of chronic medical problems incl HTN and hyperglycemia and hyper lipidemia and rosacea  Ok today Has been dx with psoriatic arthritis  is on methotrexate- and that helps along with occ cortisone shots - Dr Lavenia Atlas Also fell in may and ruptured bursa in her knee    Needs refil of phenergan Michelle Jordan takes this for migrane headaches for nausea - does not use often   Needs refil of xanax - - uses very sparingly for anxiety/ panic attack - uses about once per year  Much less frequent now    Flu shot- got that yesterday Michelle Jordan takes some vitamin C   Wt is down 4 lb with bmi of 58 On her scales - is down 19 lb since January  Is exercising some-- her pool is closed for the month for mt - trying to do something besides swimming ? Perhaps a bike  Does calesthenics in the water   124/76 Great bp  On ace  Is close to that at other offices  No cp or ha or edema   Labs at work on 06/23/11 Total 203 cholesterol with HDL 47  Glucose was 113 - post prandial a1c 5.3 - pleased with that   Lipoma on R neck is larger and causing pain and burning  Wants to get that checked out  Needs refil of rosacea med- doing very well overall  Stays out of sun and avoids hot beverages   Patient Active Problem List  Diagnoses  . HYPERLIPIDEMIA NEC/NOS  . GOUT, UNSPECIFIED  . OBESITY NOS  . IRITIS  . HYPERTENSION, ESSENTIAL NOS  . ACUTE MAXILLARY SINUSITIS  . DIVERTICULOSIS  . IBS  . ROSACEA  . PSORIASIS NEC  . OSTEOARTHROSIS NOS, UNSPECIFIED SITE  . Pain in Soft Tissues of Limb  . HYPERGLYCEMIA  . Lipoma  . Anxiety   Past Medical History  Diagnosis Date  . Diverticulosis of colon (without mention of hemorrhage)   . Gout, unspecified   . Other abnormal glucose   . Other and unspecified hyperlipidemia   . Unspecified essential hypertension   . Irritable bowel syndrome   .  Obesity, unspecified   . Osteoarthrosis, unspecified whether generalized or localized, unspecified site     knee  . Other psoriasis   . Rosacea   . Heart murmur     no prophylaxis--? valve  . Plantar fasciitis   . History of shingles   . TMJ (temporomandibular joint disorder)   . DMII (diabetes mellitus, type 2)     mild  . AC (acromioclavicular) joint bone spurs     feet   Past Surgical History  Procedure Date  . Cholecystectomy   . Laparoscopic supracervical hysterectomy 11/06    Fibroid tumor and ovarian cyst (one ovary and cervix left)  . C-s surgery 2005  . Carpal tunnel release     bilateral  . Oophorectomy 2004  . Stomach surgery years ago    for weight loss--ruptured incision  . Esophagogastroduodenoscopy     had esophageal stricture and swallowing study   History  Substance Use Topics  . Smoking status: Never Smoker   . Smokeless tobacco: Not on file  . Alcohol Use: Yes     Rare   Family History  Problem Relation Age of Onset  . Arthritis Mother   . Fibromyalgia Mother   .  Other Mother     Severe lymphedema  . Obesity Mother   . Obesity Brother   . Heart attack Father   . Coronary artery disease Father   . Ovarian cancer Maternal Aunt   . Breast cancer Paternal Aunt   . Coronary artery disease Paternal Uncle     all paternal uncles  . Stroke      GF  . Hypertension      a lot of family members  . Diabetes      GM  . Diabetes Brother    Allergies  Allergen Reactions  . Midrin     REACTION: caused tingling  . Spironolactone     REACTION: gout   Current Outpatient Prescriptions on File Prior to Visit  Medication Sig Dispense Refill  . aspirin 81 MG tablet Take 81 mg by mouth daily.        . clobetasol (TEMOVATE) 0.05 % cream Apply 1 application topically 2 (two) times daily as needed.        . traMADol (ULTRAM) 50 MG tablet Take 100 mg by mouth 2 (two) times daily. Maximum dose= 8 tablets per day      . glucosamine-chondroitin 500-400 MG  tablet Take 1 tablet by mouth 3 (three) times daily.        . Omeprazole (PRILOSEC PO) Take 1 tablet by mouth daily.              Review of Systems Review of Systems  Constitutional: Negative for fever, appetite change, fatigue and unexpected weight change.  Eyes: Negative for pain and visual disturbance.  Respiratory: Negative for cough and shortness of breath.   Cardiovascular: Negative for cp or palpitations    Gastrointestinal: Negative for nausea, diarrhea and constipation.  Genitourinary: Negative for urgency and frequency.  Skin: Negative for pallor or rash   Neurological: Negative for weakness, light-headedness, numbness and headaches.  Hematological: Negative for adenopathy. Does not bruise/bleed easily.  Psychiatric/Behavioral: Negative for dysphoric mood. The patient is not nervous/anxious.          Objective:   Physical Exam  Constitutional: Michelle Jordan appears well-developed and well-nourished. No distress.       Obese and well appearing   HENT:  Head: Normocephalic and atraumatic.  Mouth/Throat: Oropharynx is clear and moist.  Eyes: Conjunctivae and EOM are normal. Pupils are equal, round, and reactive to light. No scleral icterus.  Neck: Normal range of motion. Neck supple. No JVD present. Carotid bruit is not present. No thyromegaly present.  Cardiovascular: Normal rate, regular rhythm, normal heart sounds and intact distal pulses.  Exam reveals no gallop.   Pulmonary/Chest: Effort normal and breath sounds normal. No respiratory distress. Michelle Jordan has no wheezes.  Abdominal: Soft. Bowel sounds are normal. Michelle Jordan exhibits no distension, no abdominal bruit and no mass. There is no tenderness.  Musculoskeletal: Normal range of motion. Michelle Jordan exhibits no edema and no tenderness.  Lymphadenopathy:    Michelle Jordan has no cervical adenopathy.  Neurological: Michelle Jordan is alert. Michelle Jordan has normal reflexes. Coordination normal.  Skin: Skin is warm and dry. No rash noted. No erythema. No pallor.       R  lower neck/ upper back Large 3-4 cm area of soft induration - non tender Consistent with a lipoma   Psychiatric: Michelle Jordan has a normal mood and affect.          Assessment & Plan:

## 2011-07-30 NOTE — Assessment & Plan Note (Signed)
Very infrequent anx attacks- overall doing well Disc relaxation tech Refilled xanax for careful prn use

## 2011-07-30 NOTE — Assessment & Plan Note (Signed)
Refilled meds- vibrimycin- doing great  No changes Disc things wo avoid

## 2011-07-30 NOTE — Assessment & Plan Note (Signed)
bp in fair control at this time  No changes needed  Disc lifstyle change with low sodium diet and exercise   

## 2011-07-30 NOTE — Assessment & Plan Note (Signed)
Bothering her more now - in R neck area  Ref to gen surg for eval

## 2011-07-30 NOTE — Assessment & Plan Note (Signed)
Rev lipids- well controlled Disc goals for lipids and reasons to control them Rev labs with pt Rev low sat fat diet in detail

## 2011-07-30 NOTE — Assessment & Plan Note (Signed)
This is overall well controlled with diet a1c 5.9 Rev low glycemic diet and need for wt loss and exercise

## 2012-01-17 ENCOUNTER — Ambulatory Visit: Payer: Self-pay | Admitting: Specialist

## 2012-01-30 ENCOUNTER — Ambulatory Visit: Payer: Self-pay | Admitting: Specialist

## 2012-01-30 ENCOUNTER — Ambulatory Visit: Payer: Self-pay | Admitting: Bariatrics

## 2012-02-09 ENCOUNTER — Ambulatory Visit: Payer: Self-pay | Admitting: Specialist

## 2012-02-24 ENCOUNTER — Ambulatory Visit: Payer: Self-pay | Admitting: Specialist

## 2012-03-08 ENCOUNTER — Ambulatory Visit: Payer: Self-pay | Admitting: Specialist

## 2012-04-02 ENCOUNTER — Ambulatory Visit: Payer: Self-pay | Admitting: Obstetrics and Gynecology

## 2012-05-24 ENCOUNTER — Ambulatory Visit: Payer: Self-pay | Admitting: Specialist

## 2012-05-26 ENCOUNTER — Ambulatory Visit: Payer: Self-pay | Admitting: Specialist

## 2012-07-03 ENCOUNTER — Telehealth: Payer: Self-pay

## 2012-07-03 NOTE — Telephone Encounter (Signed)
Pt left v/m pt has appt already scheduled with Dr Milinda Antis 07/17/12; pt request written lab order for general labs including cholesterol faxed to [phone]; pt to have blood drawn next [facility] for another doctor and wants Dr Royden Purl labs done at same time. Pt is [employer] employee.Please advise.

## 2012-07-03 NOTE — Telephone Encounter (Signed)
Faxed over orders with a note on the coversheet to make sure they put results in [place]

## 2012-07-03 NOTE — Telephone Encounter (Signed)
Px is in IN box Please stress importance of [facility] putting results in epic-- I incl our epic number on the order thanks

## 2012-07-12 ENCOUNTER — Encounter: Payer: Self-pay | Admitting: Family Medicine

## 2012-07-17 ENCOUNTER — Ambulatory Visit: Payer: Self-pay | Admitting: Specialist

## 2012-07-17 ENCOUNTER — Ambulatory Visit (INDEPENDENT_AMBULATORY_CARE_PROVIDER_SITE_OTHER): Payer: 59 | Admitting: Family Medicine

## 2012-07-17 ENCOUNTER — Encounter: Payer: Self-pay | Admitting: Family Medicine

## 2012-07-17 VITALS — BP 116/82 | HR 82 | Temp 97.9°F | Ht 64.0 in | Wt 269.8 lb

## 2012-07-17 DIAGNOSIS — R7309 Other abnormal glucose: Secondary | ICD-10-CM

## 2012-07-17 DIAGNOSIS — F419 Anxiety disorder, unspecified: Secondary | ICD-10-CM

## 2012-07-17 DIAGNOSIS — F411 Generalized anxiety disorder: Secondary | ICD-10-CM

## 2012-07-17 DIAGNOSIS — Z23 Encounter for immunization: Secondary | ICD-10-CM

## 2012-07-17 DIAGNOSIS — E785 Hyperlipidemia, unspecified: Secondary | ICD-10-CM

## 2012-07-17 DIAGNOSIS — I10 Essential (primary) hypertension: Secondary | ICD-10-CM

## 2012-07-17 MED ORDER — ALPRAZOLAM 0.5 MG PO TABS: 0.5000 mg | ORAL_TABLET | Freq: Every evening | ORAL | Status: DC | PRN

## 2012-07-17 NOTE — Progress Notes (Signed)
Subjective:    Patient ID: Michelle Jordan, female    DOB: 06/27/1956, 56 y.o.   MRN: [id number]  HPI Here for f/u of chronic health conditions   Has to see ENT for perforated septum- poss rheum related , will not do surgery yet , also cysts in sinuses  Stays stuffy and her nose whistles Is also on cpap - and lowered her pressure recently- hopes to get off of it  Using netti pot  Had nl allergy tests  Trouble sleeping at night  Takes xanax very infrequently - needs refil (she needs to sleep when she gets congestion)      Wt is down 70 lb Had gastric sleeve procedure in august   Was having a lot of joint problems  No longer on nsaid and does not miss it   Is feeling good  Exercises - water exercise - jogging and kickboarding  Sees nutritionist  Is sticking to her plan   Is eating lean protein  Not much fat  Does eat red meat  Does eat cheese Has been eating shrimp   bp is stable today  No cp or palpitations or headaches or edema  No side effects to medicines  BP Readings from Last 3 Encounters:  07/17/12 116/82  07/28/11 124/76  07/28/10 142/88      Hyperglycemia -- 5.4 a1c  Very good control   Is off estrogen  Doing ok with that so far  Mood has been very good   Still sees Rheum-- Dr Gavin Potters   Patient Active Problem List  Diagnosis  . HYPERLIPIDEMIA NEC/NOS  . GOUT, UNSPECIFIED  . OBESITY NOS  . IRITIS  . HYPERTENSION, ESSENTIAL NOS  . ACUTE MAXILLARY SINUSITIS  . DIVERTICULOSIS  . IBS  . ROSACEA  . PSORIASIS NEC  . OSTEOARTHROSIS NOS, UNSPECIFIED SITE  . Pain in Soft Tissues of Limb  . HYPERGLYCEMIA  . Lipoma  . Anxiety   Past Medical History  Diagnosis Date  . Diverticulosis of colon (without mention of hemorrhage)   . Gout, unspecified   . Other abnormal glucose   . Other and unspecified hyperlipidemia   . Unspecified essential hypertension   . Irritable bowel syndrome   . Obesity, unspecified   . Osteoarthrosis, unspecified whether  generalized or localized, unspecified site     knee  . Other psoriasis   . Rosacea   . Heart murmur     no prophylaxis--? valve  . Plantar fasciitis   . History of shingles   . TMJ (temporomandibular joint disorder)   . DMII (diabetes mellitus, type 2)     mild  . AC (acromioclavicular) joint bone spurs     feet   Past Surgical History  Procedure Date  . Cholecystectomy   . Laparoscopic supracervical hysterectomy 11/06    Fibroid tumor and ovarian cyst (one ovary and cervix left)  . C-s surgery 2005  . Carpal tunnel release     bilateral  . Oophorectomy 2004  . Stomach surgery years ago    for weight loss--ruptured incision  . Esophagogastroduodenoscopy     had esophageal stricture and swallowing study   History  Substance Use Topics  . Smoking status: Never Smoker   . Smokeless tobacco: Not on file  . Alcohol Use: No   Family History  Problem Relation Age of Onset  . Arthritis Mother   . Fibromyalgia Mother   . Other Mother     Severe lymphedema  . Obesity Mother   .  Obesity Brother   . Heart attack Father   . Coronary artery disease Father   . Ovarian cancer Maternal Aunt   . Breast cancer Paternal Aunt   . Coronary artery disease Paternal Uncle     all paternal uncles  . Stroke      GF  . Hypertension      a lot of family members  . Diabetes      GM  . Diabetes Brother    Allergies  Allergen Reactions  . Isometheptene-Apap-Dichloral     REACTION: caused tingling  . Spironolactone     REACTION: gout   Current Outpatient Prescriptions on File Prior to Visit  Medication Sig Dispense Refill  . clidinium-chlordiazePOXIDE (LIBRAX) 2.5-5 MG per capsule Take 1 capsule by mouth 2 (two) times daily as needed.  180 capsule  3  . clobetasol (TEMOVATE) 0.05 % cream Apply 1 application topically 2 (two) times daily as needed.        . doxycycline (VIBRAMYCIN) 50 MG capsule Take 1 capsule (50 mg total) by mouth 2 (two) times daily as needed.  180 capsule  3  .  folic acid (FOLVITE) 1 MG tablet Take 1 mg by mouth daily.        Marland Kitchen lisinopril (PRINIVIL,ZESTRIL) 10 MG tablet Take 1 tablet (10 mg total) by mouth daily.  90 tablet  3  . methotrexate (RHEUMATREX) 2.5 MG tablet Take 2.5 mg by mouth once a week. Caution:Chemotherapy. Protect from light.  9 pills once weekly      . promethazine (PHENERGAN) 25 MG tablet Take 1 tablet (25 mg total) by mouth every 6 (six) hours as needed.  30 tablet  0  . traMADol (ULTRAM) 50 MG tablet Take 100 mg by mouth 2 (two) times daily. Maximum dose= 8 tablets per day      . ALPRAZolam (XANAX) 0.5 MG tablet Take 0.5-1 tablets (0.25-0.5 mg total) by mouth 2 (two) times daily as needed for anxiety.  30 tablet  0      Review of Systems Review of Systems  Constitutional: Negative for fever, appetite change, fatigue and unexpected weight change.  Eyes: Negative for pain and visual disturbance.  Respiratory: Negative for cough and shortness of breath.   Cardiovascular: Negative for cp or palpitations    Gastrointestinal: Negative for nausea, diarrhea and constipation.  Genitourinary: Negative for urgency and frequency.  Skin: Negative for pallor or rash   Neurological: Negative for weakness, light-headedness, numbness and headaches.  Hematological: Negative for adenopathy. Does not bruise/bleed easily.  Psychiatric/Behavioral: Negative for dysphoric mood. The patient is not nervous/anxious.         Objective:   Physical Exam  Constitutional: She appears well-developed and well-nourished. No distress.       Weight loss noted   HENT:  Head: Normocephalic and atraumatic.  Mouth/Throat: Oropharynx is clear and moist.  Eyes: Conjunctivae normal and EOM are normal. Pupils are equal, round, and reactive to light. Right eye exhibits no discharge. Left eye exhibits no discharge. No scleral icterus.  Neck: Normal range of motion. Neck supple. No JVD present. Carotid bruit is not present. No thyromegaly present.  Cardiovascular:  Normal rate, regular rhythm, normal heart sounds and intact distal pulses.  Exam reveals no gallop.   Pulmonary/Chest: Effort normal and breath sounds normal. No respiratory distress. She has no wheezes.  Abdominal: Soft. Bowel sounds are normal. She exhibits no distension, no abdominal bruit and no mass. There is no tenderness.  Musculoskeletal: She exhibits no  edema and no tenderness.  Lymphadenopathy:    She has no cervical adenopathy.  Neurological: She is alert. She has normal reflexes. No cranial nerve deficit. She exhibits normal muscle tone. Coordination normal.  Skin: Skin is warm and dry. No rash noted. No erythema. No pallor.  Psychiatric: She has a normal mood and affect.          Assessment & Plan:

## 2012-07-17 NOTE — Patient Instructions (Addendum)
Flu shot today  Take xanax sparingly only when you need it  Keep working on weight loss - good job! Avoid red meat/ fried foods/ egg yolks/ fatty breakfast meats/ butter, cheese and high fat dairy/ and shellfish   Schedule fasting lab for 6 months for cholesterol

## 2012-07-18 NOTE — Assessment & Plan Note (Signed)
a1c is below 6- very good Will continue to work on wt loss and expect further improvement

## 2012-07-18 NOTE — Assessment & Plan Note (Signed)
bp in fair control at this time  No changes needed  Disc lifstyle change with low sodium diet and exercise   Expect continued improvement with exercise    Chemistry

## 2012-07-18 NOTE — Assessment & Plan Note (Signed)
Refilled xanax for infrequent cautious use If symptoms worsen may need to disc a non habit forming daily tx

## 2012-07-18 NOTE — Assessment & Plan Note (Signed)
Future labs planned  [facility] for continued improvement with better diet

## 2012-07-26 ENCOUNTER — Ambulatory Visit: Payer: Self-pay | Admitting: Specialist

## 2012-10-01 ENCOUNTER — Other Ambulatory Visit: Payer: Self-pay | Admitting: *Deleted

## 2012-10-01 MED ORDER — LISINOPRIL 10 MG PO TABS: 10.0000 mg | ORAL_TABLET | Freq: Every day | ORAL | Status: DC

## 2013-03-25 ENCOUNTER — Encounter: Payer: Self-pay | Admitting: Radiology

## 2013-03-26 ENCOUNTER — Encounter: Payer: Self-pay | Admitting: Family Medicine

## 2013-03-26 ENCOUNTER — Ambulatory Visit (INDEPENDENT_AMBULATORY_CARE_PROVIDER_SITE_OTHER): Payer: 59 | Admitting: Family Medicine

## 2013-03-26 VITALS — BP 136/80 | HR 65 | Temp 98.1°F | Ht 64.0 in

## 2013-03-26 DIAGNOSIS — I10 Essential (primary) hypertension: Secondary | ICD-10-CM

## 2013-03-26 NOTE — Progress Notes (Signed)
Subjective:    Patient ID: Michelle Jordan, female    DOB: 1955/11/21, 57 y.o.   MRN: [id number]  HPI Is here for f/u of HTN   Is loosing wt and also inches  Lost 97 lb so far - bariatric surgery Feels very very good  Even her pain is better - gets to lower her methotrexate   No problems at all  Exercising and dancing  (line dancing and shagging)  Also swimming   Quality of life has changed so much   bp is stable today  No cp or palpitations or headaches or edema  No side effects to medicines  BP Readings from Last 3 Encounters:  03/26/13 136/80  07/17/12 116/82  07/28/11 124/76     Using home bp cuff low 100s-one teens/ over 60s-70s  When bp is on the low side she has had a few episodes of dizziness - when she stand up quickly- thinks that could also have sinus issues  bp is higher in the office than it is at home  At other doctor offices - her bp 120-130/70s usually   Here 120/80 our cuff 103/61 her cuff   Uses a wrist cuff   Uses xanax and librax very very rarely - not needing it  No phenergan lately She takes tramadol 2 per day and plans to wean off   Has to do toxicology screen today    Patient Active Problem List   Diagnosis Date Noted  . Lipoma 07/28/2011  . Anxiety 07/28/2011  . ACUTE MAXILLARY SINUSITIS 07/28/2010  . GOUT, UNSPECIFIED 03/09/2010  . HYPERGLYCEMIA 07/07/2009  . Pain in Soft Tissues of Limb 07/13/2008  . HYPERLIPIDEMIA NEC/NOS 02/12/2007  . OBESITY NOS 02/12/2007  . IRITIS 02/12/2007  . HYPERTENSION, ESSENTIAL NOS 02/12/2007  . DIVERTICULOSIS 02/12/2007  . IBS 02/12/2007  . ROSACEA 02/12/2007  . PSORIASIS NEC 02/12/2007  . OSTEOARTHROSIS NOS, UNSPECIFIED SITE 02/12/2007   Past Medical History  Diagnosis Date  . Diverticulosis of colon (without mention of hemorrhage)   . Gout, unspecified   . Other abnormal glucose   . Other and unspecified hyperlipidemia   . Unspecified essential hypertension   . Irritable bowel syndrome   .  Obesity, unspecified   . Osteoarthrosis, unspecified whether generalized or localized, unspecified site     knee  . Other psoriasis   . Rosacea   . Heart murmur     no prophylaxis--? valve  . Plantar fasciitis   . History of shingles   . TMJ (temporomandibular joint disorder)   . DMII (diabetes mellitus, type 2)     mild  . AC (acromioclavicular) joint bone spurs     feet   Past Surgical History  Procedure Laterality Date  . Cholecystectomy    . Laparoscopic supracervical hysterectomy  11/06    Fibroid tumor and ovarian cyst (one ovary and cervix left)  . C-s surgery  2005  . Carpal tunnel release      bilateral  . Oophorectomy  2004  . Stomach surgery  years ago    for weight loss--ruptured incision  . Esophagogastroduodenoscopy      had esophageal stricture and swallowing study   History  Substance Use Topics  . Smoking status: Never Smoker   . Smokeless tobacco: Not on file  . Alcohol Use: No   Family History  Problem Relation Age of Onset  . Arthritis Mother   . Fibromyalgia Mother   . Other Mother     Severe lymphedema  .  Obesity Mother   . Obesity Brother   . Heart attack Father   . Coronary artery disease Father   . Ovarian cancer Maternal Aunt   . Breast cancer Paternal Aunt   . Coronary artery disease Paternal Uncle     all paternal uncles  . Stroke      GF  . Hypertension      a lot of family members  . Diabetes      GM  . Diabetes Brother    Allergies  Allergen Reactions  . Isometheptene-Apap-Dichloral     REACTION: caused tingling  . Spironolactone     REACTION: gout   Current Outpatient Prescriptions on File Prior to Visit  Medication Sig Dispense Refill  . ALPRAZolam (XANAX) 0.5 MG tablet Take 1 tablet (0.5 mg total) by mouth at bedtime as needed for sleep or anxiety.  30 tablet  0  . b complex vitamins capsule Take 1 capsule by mouth daily.      . Biotin 5000 MCG CAPS Take 1 capsule by mouth daily.      . clidinium-chlordiazePOXIDE  (LIBRAX) 2.5-5 MG per capsule Take 1 capsule by mouth 2 (two) times daily as needed.  180 capsule  3  . clobetasol (TEMOVATE) 0.05 % cream Apply 1 application topically 2 (two) times daily as needed.        . doxycycline (VIBRAMYCIN) 50 MG capsule Take 1 capsule (50 mg total) by mouth 2 (two) times daily as needed.  180 capsule  3  . folic acid (FOLVITE) 1 MG tablet Take 1 mg by mouth daily.        Marland Kitchen lisinopril (PRINIVIL,ZESTRIL) 10 MG tablet Take 1 tablet (10 mg total) by mouth daily.  90 tablet  1  . methotrexate (RHEUMATREX) 2.5 MG tablet Take 2.5 mg by mouth once a week. Caution:Chemotherapy. Protect from light.  5 pills once weekly      . Multiple Vitamin (MULTIVITAMIN) capsule Take 1 capsule by mouth daily.      . promethazine (PHENERGAN) 25 MG tablet Take 1 tablet (25 mg total) by mouth every 6 (six) hours as needed.  30 tablet  0  . traMADol (ULTRAM) 50 MG tablet Take 100 mg by mouth 2 (two) times daily. Maximum dose= 8 tablets per day      . vitamin B-12 (CYANOCOBALAMIN) 1000 MCG tablet Take 1,000 mcg by mouth daily.       No current facility-administered medications on file prior to visit.    Review of Systems Review of Systems  Constitutional: Negative for fever, appetite change, fatigue and unexpected weight change.  Eyes: Negative for pain and visual disturbance.  Respiratory: Negative for cough and shortness of breath.   Cardiovascular: Negative for cp or palpitations    Gastrointestinal: Negative for nausea, diarrhea and constipation.  Genitourinary: Negative for urgency and frequency.  Skin: Negative for pallor or rash   Neurological: Negative for weakness, light-headedness, numbness and headaches.  Hematological: Negative for adenopathy. Does not bruise/bleed easily.  Psychiatric/Behavioral: Negative for dysphoric mood. The patient is not nervous/anxious.         Objective:   Physical Exam  Constitutional: She appears well-developed and well-nourished. No distress.   obese and well appearing  Large weight loss noted, however  HENT:  Head: Normocephalic and atraumatic.  Mouth/Throat: Oropharynx is clear and moist.  Eyes: Conjunctivae and EOM are normal. Pupils are equal, round, and reactive to light.  Neck: Normal range of motion. Neck supple. No JVD present. Carotid  bruit is not present. No thyromegaly present.  Cardiovascular: Normal rate, regular rhythm, normal heart sounds and intact distal pulses.  Exam reveals no gallop.   Pulmonary/Chest: Effort normal and breath sounds normal. No respiratory distress. She has no wheezes. She exhibits no tenderness.  Abdominal: Soft. Bowel sounds are normal. She exhibits no distension and no abdominal bruit. There is no tenderness.  Musculoskeletal: She exhibits no edema.  Lymphadenopathy:    She has no cervical adenopathy.  Neurological: She is alert. She has normal reflexes.  Skin: Skin is warm and dry. No pallor.  Psychiatric: She has a normal mood and affect.          Assessment & Plan:

## 2013-03-26 NOTE — Patient Instructions (Addendum)
The bp cuff I recommend is OMRON for arm size regular  Hold your lisinopril  Watch bp and it goes over 140 (top) or 90 (bottom) re start the medicine and let me know  Labs today  Keep taking good care of yourself

## 2013-03-28 LAB — COMPREHENSIVE METABOLIC PANEL
AST: 15 IU/L (ref 0–40)
Albumin/Globulin Ratio: 1.7 (ref 1.1–2.5)
Albumin: 4 g/dL (ref 3.5–5.5)
Alkaline Phosphatase: 80 IU/L (ref 39–117)
BUN/Creatinine Ratio: 30 — ABNORMAL HIGH (ref 9–23)
BUN: 22 mg/dL (ref 6–24)
Creatinine, Ser: 0.73 mg/dL (ref 0.57–1.00)
GFR calc Af Amer: 107 mL/min/{1.73_m2} (ref >59)
GFR calc non Af Amer: 93 mL/min/{1.73_m2} (ref >59)
Globulin, Total: 2.4 g/dL (ref 1.5–4.5)
Sodium: 142 mmol/L (ref 134–144)
Total Bilirubin: 0.2 mg/dL (ref 0.0–1.2)

## 2013-03-28 LAB — LIPID PANEL
Chol/HDL Ratio: 3.8 ratio units (ref 0.0–4.4)
LDL Calculated: 131 mg/dL — ABNORMAL HIGH (ref 0–99)
Triglycerides: 70 mg/dL (ref 0–149)

## 2013-03-31 ENCOUNTER — Encounter: Payer: Self-pay | Admitting: *Deleted

## 2013-04-08 ENCOUNTER — Encounter: Payer: Self-pay | Admitting: Family Medicine

## 2013-06-30 ENCOUNTER — Ambulatory Visit: Payer: Self-pay | Admitting: Family Medicine

## 2013-06-30 ENCOUNTER — Encounter: Payer: Self-pay | Admitting: Family Medicine

## 2013-07-24 ENCOUNTER — Other Ambulatory Visit: Payer: Self-pay | Admitting: Family Medicine

## 2013-07-24 NOTE — Telephone Encounter (Signed)
Fax refill request, please advise  

## 2013-07-27 MED ORDER — DOXYCYCLINE HYCLATE 50 MG PO CAPS: 50.0000 mg | ORAL_CAPSULE | Freq: Two times a day (BID) | ORAL | Status: DC | PRN

## 2013-07-27 NOTE — Telephone Encounter (Signed)
I refilled electronically 

## 2013-07-31 ENCOUNTER — Other Ambulatory Visit: Payer: Self-pay

## 2013-11-25 ENCOUNTER — Telehealth: Payer: Self-pay | Admitting: Family Medicine

## 2013-11-25 MED ORDER — ALPRAZOLAM 0.5 MG PO TABS: 0.5000 mg | ORAL_TABLET | Freq: Every evening | ORAL | Status: DC | PRN

## 2013-11-25 NOTE — Telephone Encounter (Signed)
I'm sorry to hear that  Please call in xanax - and if she needs it for more than a month please follow up

## 2013-11-25 NOTE — Telephone Encounter (Signed)
Rx called in as prescribed ([facility]), pt notified and advise to f/u if no improvement in a month, pt verbalized understanding

## 2013-11-25 NOTE — Telephone Encounter (Signed)
Pt left voicemail. Pt's mother passed away 2 weeks ago and since then she is having trouble sleeping. Pt is only getting a few hrs of sleep every night, waking up often throughout the night, and then she is waking up with HA and fatigue because lack of sleep. It's getting to the point where she has to call out of work just to get a few hrs of sleep because she is so weak/lethargic. Pt has tried OTC sleep aids and it's not helping so pt is requesting a Rx sleep aid just to help while she is coping with her mother's death, please advise

## 2014-01-12 ENCOUNTER — Other Ambulatory Visit: Payer: Self-pay | Admitting: Family Medicine

## 2014-01-12 NOTE — Telephone Encounter (Signed)
Electronic refill request, please advise  

## 2014-01-12 NOTE — Telephone Encounter (Signed)
Will refill electronically  

## 2014-07-03 ENCOUNTER — Other Ambulatory Visit: Payer: Self-pay | Admitting: Family Medicine

## 2014-07-03 NOTE — Telephone Encounter (Signed)
Electronic refill request, no recent/future appt., please advise  

## 2014-07-03 NOTE — Telephone Encounter (Signed)
Left voicemail requesting pt to call office 

## 2014-07-03 NOTE — Telephone Encounter (Signed)
Please schedule a f/u and refill times one Thanks

## 2014-07-09 NOTE — Telephone Encounter (Signed)
Left 2nd voicemail requesting pt to call office

## 2014-07-10 NOTE — Telephone Encounter (Signed)
appt scheduled and med refilled 

## 2014-08-18 ENCOUNTER — Encounter: Payer: Self-pay | Admitting: Family Medicine

## 2014-08-18 ENCOUNTER — Other Ambulatory Visit: Payer: Self-pay | Admitting: Family Medicine

## 2014-08-18 ENCOUNTER — Ambulatory Visit (INDEPENDENT_AMBULATORY_CARE_PROVIDER_SITE_OTHER): Payer: 59 | Admitting: Family Medicine

## 2014-08-18 VITALS — BP 126/74 | HR 69 | Temp 98.3°F | Ht 64.0 in

## 2014-08-18 DIAGNOSIS — S92902S Unspecified fracture of left foot, sequela: Secondary | ICD-10-CM

## 2014-08-18 DIAGNOSIS — R739 Hyperglycemia, unspecified: Secondary | ICD-10-CM

## 2014-08-18 DIAGNOSIS — S92902A Unspecified fracture of left foot, initial encounter for closed fracture: Secondary | ICD-10-CM | POA: Insufficient documentation

## 2014-08-18 DIAGNOSIS — G43909 Migraine, unspecified, not intractable, without status migrainosus: Secondary | ICD-10-CM | POA: Insufficient documentation

## 2014-08-18 DIAGNOSIS — I10 Essential (primary) hypertension: Secondary | ICD-10-CM

## 2014-08-18 DIAGNOSIS — E785 Hyperlipidemia, unspecified: Secondary | ICD-10-CM

## 2014-08-18 MED ORDER — ALPRAZOLAM 0.5 MG PO TABS: 0.5000 mg | ORAL_TABLET | Freq: Every evening | ORAL | Status: DC | PRN

## 2014-08-18 MED ORDER — PROMETHAZINE HCL 25 MG PO TABS: 25.0000 mg | ORAL_TABLET | Freq: Four times a day (QID) | ORAL | Status: DC | PRN

## 2014-08-18 NOTE — Progress Notes (Signed)
Pre visit review using our clinic review tool, if applicable. No additional management support is needed unless otherwise documented below in the visit note. 

## 2014-08-18 NOTE — Patient Instructions (Signed)
Get your labs drawn at [facility] when you can  Think about getting a bone density test in the future - due to the stress fractures Try to get 1200-1500 mg of calcium per day with at least 4000 iu of vitamin D - for bone health Take care of yourself  Good luck with your surgery

## 2014-08-18 NOTE — Assessment & Plan Note (Signed)
Last A1C at work 5.3 Doing well  At this point resolved Enc wt loss

## 2014-08-18 NOTE — Progress Notes (Signed)
Subjective:    Patient ID: Michelle Jordan, female    DOB: July 02, 1956, 58 y.o.   MRN: [id number]  HPI Here for medication refill for chronic medical problems   Feeling ok overall  Is dealing with a broken foot -Jones fx (nonunion) with a large painful bunion  Walking funny- now her arthritis bothers her  Has had it for a year  Has had several stress fractures Will have surgery in early Jan  A lot of pain   She was doing a lot of dancing   She has not had a bone density test yet - does not want one yet - but will most likely in the next year   Mother passed away in Oct 30, 2022    Hx of HTN tx with lifestyle change- is stable today (no meds in a year)  bp is stable today  No cp or palpitations or headaches or edema  BP Readings from Last 3 Encounters:  08/18/14 126/74  03/26/13 136/80  07/17/12 116/82    She is eating healthy most of the time -watches fat and salt  Inc her protein  Exercise is limited    Pt declines weight today Obese Started crochet at night so she would not eat (keeps her hands busy)  Hx of hyperglycemia in the past- her recent A1C 5.3 - is much better! Glucose was in the 80s   Hx of hyperlipidemia Lab Results  Component Value Date   HDL 51 03/26/2013   LDLCALC 131* 03/26/2013   TRIG 70 03/26/2013   CHOLHDL 3.8 03/26/2013    Diet-is overall pretty good   Needs a [facility] order   Hx of anxiety Takes xanax prn at night   Hx of rosacea -stable   Hx of psoriasis -stable   Patient Active Problem List   Diagnosis Date Noted  . Foot fracture, left 08/18/2014  . Migraine 08/18/2014  . Lipoma 07/28/2011  . Anxiety 07/28/2011  . GOUT, UNSPECIFIED 03/09/2010  . Pain in Soft Tissues of Limb 07/13/2008  . Hyperlipidemia 02/12/2007  . OBESITY NOS 02/12/2007  . IRITIS 02/12/2007  . Essential hypertension 02/12/2007  . DIVERTICULOSIS 02/12/2007  . IBS 02/12/2007  . ROSACEA 02/12/2007  . PSORIASIS NEC 02/12/2007  . OSTEOARTHROSIS NOS, UNSPECIFIED  SITE 02/12/2007   Past Medical History  Diagnosis Date  . Diverticulosis of colon (without mention of hemorrhage)   . Gout, unspecified   . Other abnormal glucose   . Other and unspecified hyperlipidemia   . Unspecified essential hypertension   . Irritable bowel syndrome   . Obesity, unspecified   . Osteoarthrosis, unspecified whether generalized or localized, unspecified site     knee  . Other psoriasis   . Rosacea   . Heart murmur     no prophylaxis--? valve  . Plantar fasciitis   . History of shingles   . TMJ (temporomandibular joint disorder)   . DMII (diabetes mellitus, type 2)     mild  . AC (acromioclavicular) joint bone spurs     feet   Past Surgical History  Procedure Laterality Date  . Cholecystectomy    . Laparoscopic supracervical hysterectomy  11/06    Fibroid tumor and ovarian cyst (one ovary and cervix left)  . C-s surgery  2005  . Carpal tunnel release      bilateral  . Oophorectomy  2004  . Stomach surgery  years ago    for weight loss--ruptured incision  . Esophagogastroduodenoscopy      had esophageal  stricture and swallowing study   History  Substance Use Topics  . Smoking status: Never Smoker   . Smokeless tobacco: Not on file  . Alcohol Use: No   Family History  Problem Relation Age of Onset  . Arthritis Mother   . Fibromyalgia Mother   . Other Mother     Severe lymphedema  . Obesity Mother   . Obesity Brother   . Heart attack Father   . Coronary artery disease Father   . Ovarian cancer Maternal Aunt   . Breast cancer Paternal Aunt   . Coronary artery disease Paternal Uncle     all paternal uncles  . Stroke      GF  . Hypertension      a lot of family members  . Diabetes      GM  . Diabetes Brother    Allergies  Allergen Reactions  . Isometheptene-Apap-Dichloral     REACTION: caused tingling  . Spironolactone     REACTION: gout   Current Outpatient Prescriptions on File Prior to Visit  Medication Sig Dispense Refill  .  b complex vitamins capsule Take 1 capsule by mouth daily.    . Biotin 5000 MCG CAPS Take 1 capsule by mouth daily.    . clidinium-chlordiazePOXIDE (LIBRAX) 2.5-5 MG per capsule Take 1 capsule by mouth 2 (two) times daily as needed. 180 capsule 3  . clobetasol (TEMOVATE) 0.05 % cream Apply 1 application topically 2 (two) times daily as needed.      . doxycycline (VIBRAMYCIN) 50 MG capsule Take 1 capsule by mouth   twice daily as needed as   directed 409 capsule 0  . folic acid (FOLVITE) 1 MG tablet Take 1 mg by mouth daily.      . methotrexate (RHEUMATREX) 2.5 MG tablet Take 2.5 mg by mouth once a week. Caution:Chemotherapy. Protect from light.  5 pills once weekly    . Multiple Vitamin (MULTIVITAMIN) capsule Take 1 capsule by mouth daily.    . traMADol (ULTRAM) 50 MG tablet Take 100 mg by mouth 2 (two) times daily. Maximum dose= 8 tablets per day    . vitamin B-12 (CYANOCOBALAMIN) 1000 MCG tablet Take 1,000 mcg by mouth daily.     No current facility-administered medications on file prior to visit.    Review of Systems Review of Systems  Constitutional: Negative for fever, appetite change, fatigue and unexpected weight change.  Eyes: Negative for pain and visual disturbance.  Respiratory: Negative for cough and shortness of breath.   Cardiovascular: Negative for cp or palpitations    Gastrointestinal: Negative for nausea, diarrhea and constipation.  Genitourinary: Negative for urgency and frequency.  Skin: Negative for pallor or rash MSK pos for foot pain from fx and bunion   Neurological: Negative for weakness, light-headedness, numbness and headaches.  Hematological: Negative for adenopathy. Does not bruise/bleed easily.  Psychiatric/Behavioral: Negative for dysphoric mood. The patient is not nervous/anxious.         Objective:   Physical Exam  Constitutional: She appears well-developed and well-nourished. No distress.  obese and well appearing   HENT:  Head: Normocephalic and  atraumatic.  Mouth/Throat: Oropharynx is clear and moist.  Eyes: Conjunctivae and EOM are normal. Pupils are equal, round, and reactive to light. Right eye exhibits no discharge. Left eye exhibits no discharge. No scleral icterus.  Neck: Normal range of motion. Neck supple. No JVD present. Carotid bruit is not present. No thyromegaly present.  Cardiovascular: Normal rate, regular rhythm, normal heart  sounds and intact distal pulses.  Exam reveals no gallop.   Pulmonary/Chest: Effort normal and breath sounds normal. No respiratory distress. She has no wheezes. She has no rales.  Abdominal: Soft. Bowel sounds are normal. She exhibits no distension and no mass. There is no tenderness.  Musculoskeletal: She exhibits no edema or tenderness.  Lymphadenopathy:    She has no cervical adenopathy.  Neurological: She is alert. She has normal reflexes. No cranial nerve deficit. She exhibits normal muscle tone. Coordination normal.  Skin: Skin is warm and dry. No rash noted. No erythema. No pallor.  Psychiatric: She has a normal mood and affect.          Assessment & Plan:   Problem List Items Addressed This Visit      Cardiovascular and Mediastinum   Essential hypertension - Primary    bp in fair control at this time  BP Readings from Last 1 Encounters:  08/18/14 126/74   No changes needed-- doing well without medication now  Disc lifstyle change with low sodium diet and exercise  Labs today     Relevant Orders      CBC with Differential      Comprehensive metabolic panel      TSH      Lipid panel     Musculoskeletal and Integument   Foot fracture, left    Pt will have upcoming surgery for Jones fx and also bunion  Enc her to consider dexa in the future - due to several stress fx Disc intake of ca and D      Other   RESOLVED: Hyperglycemia    Last A1C at work 5.3 Doing well  At this point resolved Enc wt loss     Hyperlipidemia    Disc goals for lipids and reasons to  control them Rev labs with pt Rev low sat fat diet in detail  Lab today    Relevant Orders      Lipid panel

## 2014-08-19 ENCOUNTER — Telehealth: Payer: Self-pay | Admitting: Family Medicine

## 2014-08-19 NOTE — Telephone Encounter (Signed)
emmi emailed °

## 2014-08-22 NOTE — Assessment & Plan Note (Signed)
Pt will have upcoming surgery for Jones fx and also bunion  Enc her to consider dexa in the future - due to several stress fx Disc intake of ca and D

## 2014-08-22 NOTE — Assessment & Plan Note (Signed)
bp in fair control at this time  BP Readings from Last 1 Encounters:  08/18/14 126/74   No changes needed-- doing well without medication now  Disc lifstyle change with low sodium diet and exercise  Labs today

## 2014-08-22 NOTE — Assessment & Plan Note (Signed)
Disc goals for lipids and reasons to control them Rev labs with pt Rev low sat fat diet in detail Lab today 

## 2014-09-11 ENCOUNTER — Telehealth: Payer: Self-pay

## 2014-09-11 NOTE — Telephone Encounter (Signed)
Pt left v/m; pt was seen on 08/18/14; pt now has appt with Dr Samara Deist at [facility] to have foot surgery in Jan 2016. Pt has received a form from Dr Vickki Muff and pt wants to know if needs appt or can Dr Glori Bickers fill out form form 08/18/14 visit. Pt request cb next week.

## 2014-09-12 NOTE — Telephone Encounter (Signed)
I assume you are talking about a pre op examination/ medical clearance I do not have a recent EKG so she needs to follow up  Also she needs to let me know whether she needs a chest xr at that visit also  Thanks

## 2014-09-14 NOTE — Telephone Encounter (Signed)
Left voicemail letting pt know she does need pre-opt clearance appt and to let us know if she needs a chest xray too

## 2014-09-15 ENCOUNTER — Telehealth: Payer: Self-pay | Admitting: Family Medicine

## 2014-09-15 NOTE — Telephone Encounter (Signed)
Pt called to speak to CMA re: pre-op clearance appointment.  Scheduled appointment for her on 09/28/2014, however she does not think she should have to come back in because she was just in the office in November.  Explained that was for a med refill/ followup appointment.  Pt displeased and requests a clinical call back at [phone] / lt

## 2014-09-15 NOTE — Telephone Encounter (Signed)
I explained to pt that a EKG is required to clear her for surgery, pt said that she will check with doc office that is doing the surgery and if they require [place] to clear her then she will keep appt but if no clearance is required then she will cancel appt

## 2014-09-25 HISTORY — PX: FOOT FRACTURE SURGERY: SHX645

## 2014-09-28 ENCOUNTER — Ambulatory Visit (INDEPENDENT_AMBULATORY_CARE_PROVIDER_SITE_OTHER): Payer: 59 | Admitting: Family Medicine

## 2014-09-28 ENCOUNTER — Encounter: Payer: Self-pay | Admitting: Family Medicine

## 2014-09-28 ENCOUNTER — Encounter: Payer: Self-pay | Admitting: Radiology

## 2014-09-28 VITALS — BP 122/80 | HR 83 | Temp 98.3°F | Ht 64.0 in | Wt 262.0 lb

## 2014-09-28 DIAGNOSIS — I10 Essential (primary) hypertension: Secondary | ICD-10-CM

## 2014-09-28 DIAGNOSIS — Z01818 Encounter for other preprocedural examination: Secondary | ICD-10-CM

## 2014-09-28 DIAGNOSIS — Z01810 Encounter for preprocedural cardiovascular examination: Secondary | ICD-10-CM

## 2014-09-28 NOTE — Assessment & Plan Note (Signed)
Pt has upcoming foot surgery for Jones fx and bunion with Dr Vickki Muff EKG-NSR rate 75 no acute changes Stable obesity and other health problems Good bp contro w/o medications  Reviewed forms Rev drug all and prev surg and history Reassuring exam   No restrictions for upcoming foot surgery

## 2014-09-28 NOTE — Assessment & Plan Note (Addendum)
Stable with lifestyle management currently  bp in fair control at this time  BP Readings from Last 1 Encounters:  09/28/14 122/80   No changes needed Disc lifstyle change with low sodium diet and exercise   Urged to keep working on wt loss

## 2014-09-28 NOTE — Progress Notes (Signed)
Subjective:    Patient ID: Michelle Jordan, female    DOB: 07/14/1956, 59 y.o.   MRN: [id number]  HPI Has foot surgery planned 10/07/14 Dr Vickki Muff - fixing a Ronnald Ramp fx (not healing) and also bunion   No hx of anesthesia problems -does not get sick with it  Has had prev surgeries including hysterectomy  Rev fam hx/ current meds and PMHX - see below    Drug intolerances  midrin- made her tingle sprinolactone - gout  Amitriptyline- had emotional reaction   Anxiety- is fairly controlled - takes xanax if needed (has had to push herself lately)   Not on any blood thinners , takes aleve occ and she knows to hold it   On methotrexate - once weekly and will likely hold a week in advance   EKG - NSR rate of 75 and no acute changes   BP Readings from Last 3 Encounters:  09/28/14 140/86  08/18/14 126/74  03/26/13 136/80   re check 122/80 Had her flu shot   Has not needed cxr in the past 6 mo   No uri symptoms in the past 4 weeks   Due for tox screen - xanax dose yesterday am  Tramadol- 2 pills last night  Has not needed librax for her ibs lately   Patient Active Problem List   Diagnosis Date Noted  . Foot fracture, left 08/18/2014  . Migraine 08/18/2014  . Lipoma 07/28/2011  . Anxiety 07/28/2011  . GOUT, UNSPECIFIED 03/09/2010  . Pain in Soft Tissues of Limb 07/13/2008  . Hyperlipidemia 02/12/2007  . OBESITY NOS 02/12/2007  . IRITIS 02/12/2007  . Essential hypertension 02/12/2007  . DIVERTICULOSIS 02/12/2007  . IBS 02/12/2007  . ROSACEA 02/12/2007  . PSORIASIS NEC 02/12/2007  . OSTEOARTHROSIS NOS, UNSPECIFIED SITE 02/12/2007   Past Medical History  Diagnosis Date  . Diverticulosis of colon (without mention of hemorrhage)   . Gout, unspecified   . Other abnormal glucose   . Other and unspecified hyperlipidemia   . Unspecified essential hypertension   . Irritable bowel syndrome   . Obesity, unspecified   . Osteoarthrosis, unspecified whether generalized or  localized, unspecified site     knee  . Other psoriasis   . Rosacea   . Heart murmur     no prophylaxis--? valve  . Plantar fasciitis   . History of shingles   . TMJ (temporomandibular joint disorder)   . DMII (diabetes mellitus, type 2)     mild  . AC (acromioclavicular) joint bone spurs     feet   Past Surgical History  Procedure Laterality Date  . Cholecystectomy    . Laparoscopic supracervical hysterectomy  11/06    Fibroid tumor and ovarian cyst (one ovary and cervix left)  . C-s surgery  2005  . Carpal tunnel release      bilateral  . Oophorectomy  2004  . Stomach surgery  years ago    for weight loss--ruptured incision  . Esophagogastroduodenoscopy      had esophageal stricture and swallowing study   History  Substance Use Topics  . Smoking status: Never Smoker   . Smokeless tobacco: Not on file  . Alcohol Use: No   Family History  Problem Relation Age of Onset  . Arthritis Mother   . Fibromyalgia Mother   . Other Mother     Severe lymphedema  . Obesity Mother   . Obesity Brother   . Heart attack Father   . Coronary artery  disease Father   . Ovarian cancer Maternal Aunt   . Breast cancer Paternal Aunt   . Coronary artery disease Paternal Uncle     all paternal uncles  . Stroke      GF  . Hypertension      a lot of family members  . Diabetes      GM  . Diabetes Brother    Allergies  Allergen Reactions  . Isometheptene-Apap-Dichloral     REACTION: caused tingling  . Spironolactone     REACTION: gout  . Amitriptyline Anxiety    moodiness   Current Outpatient Prescriptions on File Prior to Visit  Medication Sig Dispense Refill  . ALPRAZolam (XANAX) 0.5 MG tablet Take 1 tablet (0.5 mg total) by mouth at bedtime as needed for sleep or anxiety. 30 tablet 0  . b complex vitamins capsule Take 1 capsule by mouth daily.    . Biotin 5000 MCG CAPS Take 1 capsule by mouth daily.    . clidinium-chlordiazePOXIDE (LIBRAX) 2.5-5 MG per capsule Take 1 capsule  by mouth 2 (two) times daily as needed. 180 capsule 3  . clobetasol (TEMOVATE) 0.05 % cream Apply 1 application topically 2 (two) times daily as needed.      . doxycycline (VIBRAMYCIN) 50 MG capsule Take 1 capsule by mouth   twice daily as needed as   directed 062 capsule 0  . folic acid (FOLVITE) 1 MG tablet Take 1 mg by mouth daily.      . methotrexate (RHEUMATREX) 2.5 MG tablet Take 2.5 mg by mouth once a week. Caution:Chemotherapy. Protect from light.  5 pills once weekly    . Multiple Vitamin (MULTIVITAMIN) capsule Take 1 capsule by mouth daily.    . promethazine (PHENERGAN) 25 MG tablet Take 1 tablet (25 mg total) by mouth every 6 (six) hours as needed for nausea (with headache). 30 tablet 0  . traMADol (ULTRAM) 50 MG tablet Take 100 mg by mouth 2 (two) times daily. Maximum dose= 8 tablets per day    . vitamin B-12 (CYANOCOBALAMIN) 1000 MCG tablet Take 1,000 mcg by mouth daily.     No current facility-administered medications on file prior to visit.        Review of Systems Review of Systems  Constitutional: Negative for fever, appetite change, fatigue and unexpected weight change.  Eyes: Negative for pain and visual disturbance.  Respiratory: Negative for cough and shortness of breath.   Cardiovascular: Negative for cp or palpitations    Gastrointestinal: Negative for nausea, diarrhea and constipation.  Genitourinary: Negative for urgency and frequency.  Skin: Negative for pallor or rash   MSK pos for foot pain  Neurological: Negative for weakness, light-headedness, numbness and headaches.  Hematological: Negative for adenopathy. Does not bruise/bleed easily.  Psychiatric/Behavioral: Negative for dysphoric mood. The patient is not nervous/anxious.         Objective:   Physical Exam  Constitutional: She appears well-developed and well-nourished. No distress.  obese and well appearing   HENT:  Head: Normocephalic and atraumatic.  Right Ear: External ear normal.  Left  Ear: External ear normal.  Nose: Nose normal.  Mouth/Throat: Oropharynx is clear and moist.  Eyes: Conjunctivae and EOM are normal. Pupils are equal, round, and reactive to light. Right eye exhibits no discharge. Left eye exhibits no discharge. No scleral icterus.  Neck: Normal range of motion. Neck supple. No JVD present. No thyromegaly present.  Cardiovascular: Normal rate, regular rhythm, normal heart sounds and intact distal pulses.  Exam reveals no gallop.   Pulmonary/Chest: Effort normal and breath sounds normal. No respiratory distress. She has no wheezes. She has no rales.  Abdominal: Soft. Bowel sounds are normal. She exhibits no distension and no mass. There is no tenderness.  Musculoskeletal: She exhibits no edema.  Nl pedal pulses No CCE  Lymphadenopathy:    She has no cervical adenopathy.  Neurological: She is alert. She has normal reflexes. No cranial nerve deficit. She exhibits normal muscle tone. Coordination normal.  Skin: Skin is warm and dry. No rash noted. No erythema. No pallor.  Psychiatric: She has a normal mood and affect.          Assessment & Plan:   Problem List Items Addressed This Visit      Cardiovascular and Mediastinum   Essential hypertension    Stable with lifestyle management currently  bp in fair control at this time  BP Readings from Last 1 Encounters:  09/28/14 122/80   No changes needed Disc lifstyle change with low sodium diet and exercise   Urged to keep working on wt loss        Other   Preoperative examination - Primary    Pt has upcoming foot surgery for Jones fx and bunion with Dr Vickki Muff EKG-NSR rate 75 no acute changes Stable obesity and other health problems Good bp contro w/o medications  Reviewed forms Rev drug all and prev surg and history Reassuring exam   No restrictions for upcoming foot surgery       Other Visit Diagnoses    Pre-operative cardiovascular examination        Relevant Orders       EKG 12-Lead  (Completed)

## 2014-09-28 NOTE — Patient Instructions (Signed)
No restrictions for your upcoming surgery  I will get forms sent to Dr Vickki Muff  Toxicology screen today

## 2014-09-28 NOTE — Progress Notes (Signed)
Pre visit review using our clinic review tool, if applicable. No additional management support is needed unless otherwise documented below in the visit note. 

## 2014-09-30 ENCOUNTER — Telehealth: Payer: Self-pay

## 2014-09-30 NOTE — Telephone Encounter (Signed)
Pt left v/m; pt was seen 09/28/14 and Dr Karna Christmas office has not received pre op screening info and pt request faxed to [phone] atten: Margarita Grizzle. pts surgery is scheduled 10/07/14 and pt request cb to confirm info has been faxed.

## 2014-09-30 NOTE — Telephone Encounter (Signed)
Form faxed and pt notified

## 2014-10-07 ENCOUNTER — Ambulatory Visit: Payer: Self-pay | Admitting: Podiatry

## 2014-10-09 ENCOUNTER — Encounter: Payer: Self-pay | Admitting: Family Medicine

## 2015-01-05 ENCOUNTER — Ambulatory Visit
Admit: 2015-01-05 | Disposition: A | Discharge status: Home / Self Care | Payer: Self-pay | Attending: Family Medicine | Admitting: Family Medicine

## 2015-01-06 ENCOUNTER — Encounter: Payer: Self-pay | Admitting: Family Medicine

## 2015-01-18 LAB — SURGICAL PATHOLOGY

## 2015-02-26 ENCOUNTER — Other Ambulatory Visit: Payer: Self-pay | Admitting: Unknown Physician Specialty

## 2015-02-26 DIAGNOSIS — M1711 Unilateral primary osteoarthritis, right knee: Secondary | ICD-10-CM

## 2015-03-06 ENCOUNTER — Ambulatory Visit
Admission: RE | Admit: 2015-03-06 | Discharge: 2015-03-06 | Disposition: A | Discharge status: Home / Self Care | Payer: 59 | Source: Ambulatory Visit | Attending: Unknown Physician Specialty | Admitting: Unknown Physician Specialty

## 2015-03-06 DIAGNOSIS — M25561 Pain in right knee: Secondary | ICD-10-CM | POA: Diagnosis present

## 2015-03-06 DIAGNOSIS — M1711 Unilateral primary osteoarthritis, right knee: Secondary | ICD-10-CM | POA: Insufficient documentation

## 2015-03-23 ENCOUNTER — Other Ambulatory Visit: Payer: Self-pay | Admitting: Family Medicine

## 2015-03-23 NOTE — Telephone Encounter (Signed)
Please refill for 6 mo 

## 2015-03-23 NOTE — Telephone Encounter (Signed)
Electronic refill request, last refilled on 07/10/14 #180 with 0 additional refills, please advise

## 2015-03-23 NOTE — Telephone Encounter (Signed)
done

## 2015-05-14 ENCOUNTER — Other Ambulatory Visit: Payer: Self-pay | Admitting: Family Medicine

## 2015-05-14 NOTE — Telephone Encounter (Signed)
Px written for call in   

## 2015-05-14 NOTE — Telephone Encounter (Signed)
Electronic refill request, last OV was a pre opt clearance appt on 09/28/14, last refilled on 08/18/14 #30 with 0 refills, please advise

## 2015-05-14 NOTE — Telephone Encounter (Signed)
Rx called in as prescribed 

## 2015-06-22 ENCOUNTER — Telehealth: Payer: Self-pay | Admitting: Family Medicine

## 2015-06-22 NOTE — Telephone Encounter (Signed)
Pt called to let us know that she is going to be faxing form over to you. cb number is [phone] Thanks

## 2015-06-30 NOTE — Telephone Encounter (Signed)
I have not seen any forms on this pt, before I call pt will rout message to Dr. Glori Bickers to make sure she doesn't have a form for pt

## 2015-06-30 NOTE — Telephone Encounter (Signed)
Called pt and let her know, she will refax it now, I advise pt I will call her back once we receive it

## 2015-06-30 NOTE — Telephone Encounter (Signed)
I have not seen it yet -will keep an eye out for it

## 2015-06-30 NOTE — Telephone Encounter (Signed)
Pt has not heard about form that was faxed over. Please call back at [phone] until 4 pm and [phone] after

## 2015-07-02 ENCOUNTER — Other Ambulatory Visit: Payer: Self-pay | Admitting: *Deleted

## 2015-07-06 NOTE — Telephone Encounter (Signed)
Dr. Glori Bickers received form and filled it out and I faxed form back to pt, pt advised

## 2015-09-08 ENCOUNTER — Encounter
Admission: RE | Admit: 2015-09-08 | Discharge: 2015-09-08 | Disposition: A | Discharge status: Home / Self Care | Payer: 59 | Source: Ambulatory Visit | Attending: Unknown Physician Specialty | Admitting: Unknown Physician Specialty

## 2015-09-08 DIAGNOSIS — E785 Hyperlipidemia, unspecified: Secondary | ICD-10-CM | POA: Diagnosis not present

## 2015-09-08 DIAGNOSIS — I10 Essential (primary) hypertension: Secondary | ICD-10-CM | POA: Diagnosis not present

## 2015-09-08 DIAGNOSIS — E669 Obesity, unspecified: Secondary | ICD-10-CM | POA: Diagnosis not present

## 2015-09-08 DIAGNOSIS — F419 Anxiety disorder, unspecified: Secondary | ICD-10-CM | POA: Diagnosis not present

## 2015-09-08 DIAGNOSIS — L719 Rosacea, unspecified: Secondary | ICD-10-CM | POA: Diagnosis not present

## 2015-09-08 DIAGNOSIS — Z79899 Other long term (current) drug therapy: Secondary | ICD-10-CM | POA: Diagnosis not present

## 2015-09-08 DIAGNOSIS — K5730 Diverticulosis of large intestine without perforation or abscess without bleeding: Secondary | ICD-10-CM | POA: Diagnosis not present

## 2015-09-08 DIAGNOSIS — E039 Hypothyroidism, unspecified: Secondary | ICD-10-CM | POA: Diagnosis not present

## 2015-09-08 DIAGNOSIS — Z8669 Personal history of other diseases of the nervous system and sense organs: Secondary | ICD-10-CM | POA: Diagnosis not present

## 2015-09-08 DIAGNOSIS — K589 Irritable bowel syndrome without diarrhea: Secondary | ICD-10-CM | POA: Diagnosis not present

## 2015-09-08 DIAGNOSIS — Z1211 Encounter for screening for malignant neoplasm of colon: Secondary | ICD-10-CM | POA: Diagnosis present

## 2015-09-08 DIAGNOSIS — L409 Psoriasis, unspecified: Secondary | ICD-10-CM | POA: Diagnosis not present

## 2015-09-08 DIAGNOSIS — M109 Gout, unspecified: Secondary | ICD-10-CM | POA: Diagnosis not present

## 2015-09-08 DIAGNOSIS — Z888 Allergy status to other drugs, medicaments and biological substances status: Secondary | ICD-10-CM | POA: Diagnosis not present

## 2015-09-08 DIAGNOSIS — M722 Plantar fascial fibromatosis: Secondary | ICD-10-CM | POA: Diagnosis not present

## 2015-09-08 HISTORY — DX: Headache: R51

## 2015-09-08 HISTORY — DX: Nausea with vomiting, unspecified: Z98.890

## 2015-09-08 HISTORY — DX: Anxiety disorder, unspecified: F41.9

## 2015-09-08 HISTORY — DX: Nausea with vomiting, unspecified: R11.2

## 2015-09-08 HISTORY — DX: Other complications of anesthesia, initial encounter: T88.59XA

## 2015-09-08 HISTORY — DX: Adverse effect of unspecified anesthetic, initial encounter: T41.45XA

## 2015-09-08 HISTORY — DX: Personal history of other diseases of the digestive system: Z87.19

## 2015-09-08 HISTORY — DX: Headache, unspecified: R51.9

## 2015-09-08 LAB — URINALYSIS COMPLETE WITH MICROSCOPIC (ARMC ONLY)
BILIRUBIN URINE: NEGATIVE
Bacteria, UA: NONE SEEN
GLUCOSE, UA: NEGATIVE mg/dL
HGB URINE DIPSTICK: NEGATIVE
KETONES UR: NEGATIVE mg/dL
LEUKOCYTES UA: NEGATIVE
NITRITE: NEGATIVE
Protein, ur: NEGATIVE mg/dL
SPECIFIC GRAVITY, URINE: 1.021 (ref 1.005–1.030)
pH: 7 (ref 5.0–8.0)

## 2015-09-08 LAB — BASIC METABOLIC PANEL
Anion gap: 7 (ref 5–15)
BUN: 16 mg/dL (ref 6–20)
CALCIUM: 9.3 mg/dL (ref 8.9–10.3)
CO2: 29 mmol/L (ref 22–32)
CREATININE: 0.46 mg/dL (ref 0.44–1.00)
Chloride: 103 mmol/L (ref 101–111)
GFR calc Af Amer: >60 mL/min (ref >60)
GFR calc non Af Amer: >60 mL/min (ref >60)
GLUCOSE: 105 mg/dL — AB (ref 65–99)
Potassium: 4 mmol/L (ref 3.5–5.1)
Sodium: 139 mmol/L (ref 135–145)

## 2015-09-08 LAB — TYPE AND SCREEN
ABO/RH(D): A POS
Antibody Screen: NEGATIVE

## 2015-09-08 LAB — APTT: APTT: 26 s (ref 24–36)

## 2015-09-08 LAB — ABO/RH: ABO/RH(D): A POS

## 2015-09-08 LAB — PROTIME-INR
INR: 0.98
Prothrombin Time: 13.2 seconds (ref 11.4–15.0)

## 2015-09-08 LAB — SURGICAL PCR SCREEN
MRSA, PCR: NEGATIVE
Staphylococcus aureus: NEGATIVE

## 2015-09-08 NOTE — Patient Instructions (Signed)
  Your procedure is scheduled on: Wednesday Dec. 28, 2016. Report to Same Day Surgery. To find out your arrival time please call [phone] between 1PM - 3PM on Tuesday 27, 2016.  Remember: Instructions that are not followed completely may result in serious medical risk, up to and including death, or upon the discretion of your surgeon and anesthesiologist your surgery may need to be rescheduled.    _x___ 1. Do not eat food or drink liquids after midnight. No gum chewing or hard candies.     ____ 2. No Alcohol for 24 hours before or after surgery.   ____ 3. Bring all medications with you on the day of surgery if instructed.    __x__ 4. Notify your doctor if there is any change in your medical condition     (cold, fever, infections).     Do not wear jewelry, make-up, hairpins, clips or nail polish.  Do not wear lotions, powders, or perfumes. You may wear deodorant.  Do not shave 48 hours prior to surgery. Men may shave face and neck.  Do not bring valuables to the hospital.    [facility] is not responsible for any belongings or valuables.               Contacts, dentures or bridgework may not be worn into surgery.  Leave your suitcase in the car. After surgery it may be brought to your room.  For patients admitted to the hospital, discharge time is determined by your treatment team.   Patients discharged the day of surgery will not be allowed to drive home.    Please read over the following fact sheets that you were given:   [facility] Preparing for Surgery  _x___ Take these medicines the morning of surgery with A SIP OF WATER:    Can take ALPRAZolam Duanne Moron) if needed.  ____ Fleet Enema (as directed)   _x_ Use CHG Soap as directed  ____ Use inhalers on the day of surgery  ____ Stop metformin 2 days prior to surgery    ____ Take 1/2 of usual insulin dose the night before surgery and none on the morning of surgery.   ____ Stop Coumadin/Plavix/aspirin on does not  apply.  _x__ Stop Anti-inflammatories now. Tylenol or Tramadol is ok to take for pain.   _x___ Stop supplements until after surgery.    ____ Bring C-Pap to the hospital.

## 2015-09-09 ENCOUNTER — Encounter: Payer: Self-pay | Admitting: *Deleted

## 2015-09-10 ENCOUNTER — Ambulatory Visit: Payer: 59 | Admitting: Certified Registered"

## 2015-09-10 ENCOUNTER — Encounter: Payer: Self-pay | Admitting: *Deleted

## 2015-09-10 ENCOUNTER — Ambulatory Visit
Admission: RE | Admit: 2015-09-10 | Discharge: 2015-09-10 | Disposition: A | Discharge status: Home / Self Care | Payer: 59 | Source: Ambulatory Visit | Attending: Gastroenterology | Admitting: Gastroenterology

## 2015-09-10 ENCOUNTER — Encounter
Admission: RE | Disposition: A | Discharge status: Home / Self Care | Payer: Self-pay | Source: Ambulatory Visit | Attending: Gastroenterology

## 2015-09-10 DIAGNOSIS — Z79899 Other long term (current) drug therapy: Secondary | ICD-10-CM | POA: Insufficient documentation

## 2015-09-10 DIAGNOSIS — Z8669 Personal history of other diseases of the nervous system and sense organs: Secondary | ICD-10-CM | POA: Insufficient documentation

## 2015-09-10 DIAGNOSIS — E669 Obesity, unspecified: Secondary | ICD-10-CM | POA: Insufficient documentation

## 2015-09-10 DIAGNOSIS — F419 Anxiety disorder, unspecified: Secondary | ICD-10-CM | POA: Insufficient documentation

## 2015-09-10 DIAGNOSIS — E039 Hypothyroidism, unspecified: Secondary | ICD-10-CM | POA: Insufficient documentation

## 2015-09-10 DIAGNOSIS — Z888 Allergy status to other drugs, medicaments and biological substances status: Secondary | ICD-10-CM | POA: Insufficient documentation

## 2015-09-10 DIAGNOSIS — K589 Irritable bowel syndrome without diarrhea: Secondary | ICD-10-CM | POA: Insufficient documentation

## 2015-09-10 DIAGNOSIS — M722 Plantar fascial fibromatosis: Secondary | ICD-10-CM | POA: Insufficient documentation

## 2015-09-10 DIAGNOSIS — Z1211 Encounter for screening for malignant neoplasm of colon: Secondary | ICD-10-CM | POA: Diagnosis not present

## 2015-09-10 DIAGNOSIS — E785 Hyperlipidemia, unspecified: Secondary | ICD-10-CM | POA: Insufficient documentation

## 2015-09-10 DIAGNOSIS — K5730 Diverticulosis of large intestine without perforation or abscess without bleeding: Secondary | ICD-10-CM | POA: Insufficient documentation

## 2015-09-10 DIAGNOSIS — L719 Rosacea, unspecified: Secondary | ICD-10-CM | POA: Insufficient documentation

## 2015-09-10 DIAGNOSIS — L409 Psoriasis, unspecified: Secondary | ICD-10-CM | POA: Insufficient documentation

## 2015-09-10 DIAGNOSIS — I10 Essential (primary) hypertension: Secondary | ICD-10-CM | POA: Insufficient documentation

## 2015-09-10 DIAGNOSIS — M109 Gout, unspecified: Secondary | ICD-10-CM | POA: Insufficient documentation

## 2015-09-10 HISTORY — DX: Zoster without complications: B02.9

## 2015-09-10 HISTORY — PX: COLONOSCOPY: SHX5424

## 2015-09-10 HISTORY — DX: Psoriatic arthritis mutilans: L40.52

## 2015-09-10 HISTORY — DX: Cluster headache syndrome, unspecified, not intractable: G44.009

## 2015-09-10 HISTORY — DX: Migraine, unspecified, not intractable, without status migrainosus: G43.909

## 2015-09-10 HISTORY — DX: Stress fracture, right foot, initial encounter for fracture: M84.374A

## 2015-09-10 HISTORY — DX: Lateral epicondylitis, unspecified elbow: M77.10

## 2015-09-10 HISTORY — DX: Hypothyroidism, unspecified: E03.9

## 2015-09-10 SURGERY — COLONOSCOPY
Anesthesia: General

## 2015-09-10 MED ORDER — LIDOCAINE HCL (CARDIAC) 20 MG/ML IV SOLN
INTRAVENOUS | Status: DC | PRN
  Administered 2015-09-10: 60 mg via INTRAVENOUS

## 2015-09-10 MED ORDER — PROPOFOL 10 MG/ML IV BOLUS
INTRAVENOUS | Status: DC | PRN
  Administered 2015-09-10: 40 mg via INTRAVENOUS

## 2015-09-10 MED ORDER — SODIUM CHLORIDE 0.9 % IV SOLN
INTRAVENOUS | Status: DC
  Administered 2015-09-10: 1000 mL via INTRAVENOUS

## 2015-09-10 MED ORDER — SODIUM CHLORIDE 0.9 % IV SOLN
INTRAVENOUS | Status: DC
  Administered 2015-09-10: 08:00:00 via INTRAVENOUS

## 2015-09-10 MED ORDER — FENTANYL CITRATE (PF) 100 MCG/2ML IJ SOLN: 25.0000 ug | INTRAMUSCULAR | Status: DC | PRN

## 2015-09-10 MED ORDER — ONDANSETRON HCL 4 MG/2ML IJ SOLN
4.0000 mg | Freq: Once | INTRAMUSCULAR | Status: DC | PRN
Start: 2015-09-10 — End: 2015-09-10

## 2015-09-10 MED ORDER — MIDAZOLAM HCL 2 MG/2ML IJ SOLN
INTRAMUSCULAR | Status: DC | PRN
  Administered 2015-09-10 (×2): 1 mg via INTRAVENOUS

## 2015-09-10 MED ORDER — PHENYLEPHRINE HCL 10 MG/ML IJ SOLN
INTRAMUSCULAR | Status: DC | PRN
Start: 2015-09-10 — End: 2015-09-10
  Administered 2015-09-10: 100 ug via INTRAVENOUS

## 2015-09-10 MED ORDER — PROPOFOL 500 MG/50ML IV EMUL
INTRAVENOUS | Status: DC | PRN
  Administered 2015-09-10: 120 ug/kg/min via INTRAVENOUS

## 2015-09-10 MED ORDER — FENTANYL CITRATE (PF) 100 MCG/2ML IJ SOLN
INTRAMUSCULAR | Status: DC | PRN
  Administered 2015-09-10: 50 ug via INTRAVENOUS

## 2015-09-10 MED ORDER — ONDANSETRON HCL 4 MG/2ML IJ SOLN
INTRAMUSCULAR | Status: DC | PRN
  Administered 2015-09-10: 4 mg via INTRAVENOUS

## 2015-09-10 NOTE — H&P (Signed)
Outpatient short stay form Pre-procedure 09/10/2015 8:09 AM Lollie Sails MD  Primary Physician: Dr. Loura Pardon  Reason for visit:  Screening colonoscopy  History of present illness:  Patient is a 60 year old female presenting today for colonoscopy. This her first complete, full screening colonoscopy. She tolerated her prep well. She takes no current aspirin products. She takes no anticoagulation medications. He is having no symptoms.    Current facility-administered medications:  .  0.9 %  sodium chloride infusion, , Intravenous, Continuous, Lollie Sails, MD, Last Rate: 20 mL/hr at 09/10/15 0746, 1,000 mL at 09/10/15 0746 .  0.9 %  sodium chloride infusion, , Intravenous, Continuous, Lollie Sails, MD  Prescriptions prior to admission  Medication Sig Dispense Refill Last Dose  . ALPRAZolam (XANAX) 0.5 MG tablet TAKE 1 TABLET BY MOUTH AT BEDTIME AS NEEDED FOR SLEEP 30 tablet 0   . b complex vitamins capsule Take 1 capsule by mouth at bedtime.    Taking  . Biotin 5000 MCG CAPS Take 1 capsule by mouth at bedtime.    Taking  . calcium carbonate (OSCAL) 1500 (600 CA) MG TABS tablet Take 600 mg of elemental calcium by mouth at bedtime.     . Cholecalciferol (VITAMIN D3) 5000 UNITS CAPS Take 1 capsule by mouth at bedtime.     . clidinium-chlordiazePOXIDE (LIBRAX) 2.5-5 MG per capsule Take 1 capsule by mouth 2 (two) times daily as needed. 180 capsule 3 Taking  . clobetasol (TEMOVATE) 0.05 % cream Apply 1 application topically 2 (two) times daily as needed.     Taking  . doxycycline (VIBRAMYCIN) 50 MG capsule Take 1 capsule by mouth  twice daily as needed as  directed 180 capsule 1   . folic acid (FOLVITE) 1 MG tablet Take 1 mg by mouth at bedtime.    Taking  . methotrexate (RHEUMATREX) 2.5 MG tablet Take 2.5 mg by mouth once a week. Caution:Chemotherapy. Protect from light.  6 pills once weekly on Thursdays.   Taking  . Multiple Vitamin (MULTIVITAMIN) capsule Take 1 capsule by  mouth at bedtime.    Taking  . promethazine (PHENERGAN) 25 MG tablet Take 1 tablet (25 mg total) by mouth every 6 (six) hours as needed for nausea (with headache). 30 tablet 0 Taking  . traMADol (ULTRAM) 50 MG tablet Take 50 mg by mouth 2 (two) times daily. Maximum dose= 8 tablets per day   Taking  . vitamin B-12 (CYANOCOBALAMIN) 1000 MCG tablet Take 1,000 mcg by mouth at bedtime.    Taking     Allergies  Allergen Reactions  . Etodolac   . Isometheptene-Dichloral-Apap     REACTION: caused tingling  . Spironolactone     REACTION: gout  . Amitriptyline Anxiety    moodiness     Past Medical History  Diagnosis Date  . Diverticulosis of colon (without mention of hemorrhage)   . Gout, unspecified   . Other abnormal glucose   . Other and unspecified hyperlipidemia   . Unspecified essential hypertension   . Irritable bowel syndrome   . Obesity, unspecified   . Other psoriasis   . Rosacea   . Heart murmur     no prophylaxis--? valve  . Plantar fasciitis   . History of shingles   . TMJ (temporomandibular joint disorder)   . AC (acromioclavicular) joint bone spurs     feet  . Complication of anesthesia   . PONV (postoperative nausea and vomiting)     nausea only with foot  surgery in Jan. 2016, otherwise no nausea  . Anxiety   . History of hiatal hernia   . Hypothyroidism     per pt, was dx in 1978, previosly on Synthroid, but has not taken it in years  . Headache   . Migraine headache   . Migraine-cluster headache syndrome   . Osteoarthrosis, unspecified whether generalized or localized, unspecified site     knees, cervical spine  . Psoriatic arthritis mutilans (HCC)     methotrexate  . Epicondylitis, lateral     right  . Shingles   . Stress fracture of right foot     Review of systems:      Physical Exam    Heart and lungs: Regular rate and rhythm without rub or gallop, lungs are bilaterally clear    HEENT: Normocephalic atraumatic eyes are anicteric    Other:      Pertinant exam for procedure: Soft nontender nondistended bowel sounds positive normoactive    Planned proceedures: Colonoscopy and indicated procedures. I have discussed the risks benefits and complications of procedures to include not limited to bleeding, infection, perforation and the risk of sedation and the patient wishes to proceed. I have discussed the risks benefits and complications of procedures to include not limited to bleeding, infection, perforation and the risk of sedation and the patient wishes to proceed.    Lollie Sails, MD Gastroenterology 09/10/2015  8:09 AM

## 2015-09-10 NOTE — Transfer of Care (Signed)
Immediate Anesthesia Transfer of Care Note  Patient: Michelle Jordan  Procedure(s) Performed: Procedure(s): COLONOSCOPY (N/A)  Patient Location: Endoscopy Unit  Anesthesia Type:General  Level of Consciousness: awake, alert , oriented and patient cooperative  Airway & Oxygen Therapy: Patient Spontanous Breathing and Patient connected to nasal cannula oxygen  Post-op Assessment: Report given to RN, Post -op Vital signs reviewed and stable and Patient moving all extremities X 4  Post vital signs: Reviewed and stable  Last Vitals:  Filed Vitals:   09/10/15 0728 09/10/15 0846  BP: 158/93 138/68  Pulse: 90 79  Temp: 36.7 C 36 C  Resp: 18 17    Complications: No apparent anesthesia complications

## 2015-09-10 NOTE — Op Note (Signed)
[facility] Patient Name: Michelle Jordan Procedure Date: 09/10/2015 8:11 AM MRN: [id number] Account #: [id number] Date of Birth: 01-24-1956 Admit Type: Outpatient Age: 59 Room: [facility] ENDO ROOM 3 Gender: Female Note Status: Finalized Procedure:         Colonoscopy Indications:       Screening for colorectal malignant neoplasm, This is the                     patient's first colonoscopy Providers:         Lollie Sails, MD Referring MD:      Wynelle Fanny. Tower, MD (Referring MD) Medicines:         Monitored Anesthesia Care Complications:     No immediate complications. Procedure:         Pre-Anesthesia Assessment:                    - ASA Grade Assessment: II - A patient with mild systemic                     disease.                    After obtaining informed consent, the colonoscope was                     passed under direct vision. Throughout the procedure, the                     patient's blood pressure, pulse, and oxygen saturations                     were monitored continuously. The Olympus PCF-H180AL                     colonoscope ( S#: [id number] ) was introduced through the                     anus and advanced to the the cecum, identified by                     appendiceal orifice and ileocecal valve. The colonoscopy                     was performed with moderate difficulty due to significant                     looping. Successful completion of the procedure was aided                     by using manual pressure. The quality of the bowel                     preparation was good. Findings:      Multiple small to medium diverticula were found in the sigmoid colon, in       the descending colon, in the transverse colon and in the ascending colon.      The retroflexed view of the distal rectum and anal verge was normal and       showed no anal or rectal abnormalities.      The digital rectal exam was normal. Impression:        -  Diverticulosis in the sigmoid colon, in the descending  colon, in the transverse colon and in the ascending colon.                    - The distal rectum and anal verge are normal on                     retroflexion view.                    - No specimens collected. Recommendation:    - Discharge patient to home.                    - Repeat colonoscopy in 10 years for screening purposes. Procedure Code(s): --- Professional ---                    [phone], Colonoscopy, flexible; diagnostic, including                     collection of specimen(s) by brushing or washing, when                     performed (separate procedure) CPT copyright 2014 American Medical Association. All rights reserved. The codes documented in this report are preliminary and upon coder review may  be revised to meet current compliance requirements. Lollie Sails, MD 09/10/2015 8:46:23 AM This report has been signed electronically. Number of Addenda: 0 Note Initiated On: 09/10/2015 8:11 AM Scope Withdrawal Time: 0 hours 7 minutes 21 seconds  Total Procedure Duration: 0 hours 22 minutes 40 seconds       [facility]

## 2015-09-10 NOTE — Anesthesia Postprocedure Evaluation (Signed)
Anesthesia Post Note  Patient: Michelle Jordan  Procedure(s) Performed: Procedure(s) (LRB): COLONOSCOPY (N/A)  Patient location during evaluation: Endoscopy Anesthesia Type: General Level of consciousness: awake, awake and alert and oriented Pain management: pain level controlled Vital Signs Assessment: post-procedure vital signs reviewed and stable Respiratory status: spontaneous breathing Cardiovascular status: blood pressure returned to baseline Anesthetic complications: no    Last Vitals:  Filed Vitals:   09/10/15 0910 09/10/15 0920  BP: 128/66 126/58  Pulse: 66 63  Temp:    Resp: 13 15    Last Pain: There were no vitals filed for this visit.               Mirai Greenwood

## 2015-09-10 NOTE — Anesthesia Preprocedure Evaluation (Addendum)
Anesthesia Evaluation  Patient identified by MRN, date of birth, ID band Patient awake    History of Anesthesia Complications (+) PONV  Airway Mallampati: II  TM Distance: >3 FB Neck ROM: Full    Dental no notable dental hx.    Pulmonary    Pulmonary exam normal        Cardiovascular hypertension, Pt. on medications Normal cardiovascular exam+ Valvular Problems/Murmurs      Neuro/Psych  Headaches, Anxiety    GI/Hepatic Neg liver ROS, hiatal hernia,   Endo/Other  Hypothyroidism   Renal/GU negative Renal ROS  negative genitourinary   Musculoskeletal  (+) Arthritis , Osteoarthritis,    Abdominal Normal abdominal exam  (+)   Peds negative pediatric ROS (+)  Hematology   Anesthesia Other Findings   Reproductive/Obstetrics                            Anesthesia Physical Anesthesia Plan  ASA: II  Anesthesia Plan: General   Post-op Pain Management:    Induction: Intravenous  Airway Management Planned: Nasal Cannula  Additional Equipment:   Intra-op Plan:   Post-operative Plan:   Informed Consent:   Dental advisory given  Plan Discussed with: CRNA and Surgeon  Anesthesia Plan Comments:         Anesthesia Quick Evaluation

## 2015-09-11 ENCOUNTER — Encounter: Payer: Self-pay | Admitting: Gastroenterology

## 2015-09-22 ENCOUNTER — Encounter
Admission: RE | Disposition: A | Discharge status: Home Health | Payer: Self-pay | Source: Ambulatory Visit | Attending: Unknown Physician Specialty

## 2015-09-22 ENCOUNTER — Inpatient Hospital Stay: Payer: 59

## 2015-09-22 ENCOUNTER — Inpatient Hospital Stay
Admission: RE | Admit: 2015-09-22 | Discharge: 2015-09-25 | DRG: 470 | Disposition: A | Discharge status: Home Health | Payer: 59 | Source: Ambulatory Visit | Attending: Unknown Physician Specialty | Admitting: Unknown Physician Specialty

## 2015-09-22 ENCOUNTER — Encounter: Payer: Self-pay | Admitting: *Deleted

## 2015-09-22 ENCOUNTER — Inpatient Hospital Stay: Payer: 59 | Admitting: Certified Registered Nurse Anesthetist

## 2015-09-22 DIAGNOSIS — E785 Hyperlipidemia, unspecified: Secondary | ICD-10-CM | POA: Diagnosis present

## 2015-09-22 DIAGNOSIS — Z9109 Other allergy status, other than to drugs and biological substances: Secondary | ICD-10-CM | POA: Diagnosis not present

## 2015-09-22 DIAGNOSIS — I10 Essential (primary) hypertension: Secondary | ICD-10-CM | POA: Diagnosis present

## 2015-09-22 DIAGNOSIS — Z8249 Family history of ischemic heart disease and other diseases of the circulatory system: Secondary | ICD-10-CM | POA: Diagnosis not present

## 2015-09-22 DIAGNOSIS — E039 Hypothyroidism, unspecified: Secondary | ICD-10-CM | POA: Diagnosis present

## 2015-09-22 DIAGNOSIS — Z79899 Other long term (current) drug therapy: Secondary | ICD-10-CM

## 2015-09-22 DIAGNOSIS — M1711 Unilateral primary osteoarthritis, right knee: Secondary | ICD-10-CM | POA: Diagnosis present

## 2015-09-22 DIAGNOSIS — M109 Gout, unspecified: Secondary | ICD-10-CM | POA: Diagnosis present

## 2015-09-22 DIAGNOSIS — Z6841 Body Mass Index (BMI) 40.0 and over, adult: Secondary | ICD-10-CM

## 2015-09-22 DIAGNOSIS — L4050 Arthropathic psoriasis, unspecified: Secondary | ICD-10-CM | POA: Diagnosis present

## 2015-09-22 DIAGNOSIS — G8918 Other acute postprocedural pain: Secondary | ICD-10-CM

## 2015-09-22 DIAGNOSIS — Z96659 Presence of unspecified artificial knee joint: Secondary | ICD-10-CM

## 2015-09-22 DIAGNOSIS — Z8261 Family history of arthritis: Secondary | ICD-10-CM

## 2015-09-22 DIAGNOSIS — E669 Obesity, unspecified: Secondary | ICD-10-CM | POA: Diagnosis present

## 2015-09-22 DIAGNOSIS — Z833 Family history of diabetes mellitus: Secondary | ICD-10-CM | POA: Diagnosis not present

## 2015-09-22 DIAGNOSIS — Z880 Allergy status to penicillin: Secondary | ICD-10-CM

## 2015-09-22 DIAGNOSIS — Z800 Family history of malignant neoplasm of digestive organs: Secondary | ICD-10-CM | POA: Diagnosis not present

## 2015-09-22 DIAGNOSIS — K589 Irritable bowel syndrome without diarrhea: Secondary | ICD-10-CM | POA: Diagnosis present

## 2015-09-22 DIAGNOSIS — K5730 Diverticulosis of large intestine without perforation or abscess without bleeding: Secondary | ICD-10-CM | POA: Diagnosis present

## 2015-09-22 HISTORY — PX: TOTAL KNEE ARTHROPLASTY: SHX125

## 2015-09-22 LAB — CBC
HEMATOCRIT: 39.9 % (ref 35.0–47.0)
Hemoglobin: 12.7 g/dL (ref 12.0–16.0)
MCH: 28.1 pg (ref 26.0–34.0)
MCHC: 31.8 g/dL — AB (ref 32.0–36.0)
MCV: 88.2 fL (ref 80.0–100.0)
Platelets: 218 10*3/uL (ref 150–440)
RBC: 4.52 MIL/uL (ref 3.80–5.20)
RDW: 13.9 % (ref 11.5–14.5)
WBC: 11.1 10*3/uL — ABNORMAL HIGH (ref 3.6–11.0)

## 2015-09-22 LAB — CREATININE, SERUM
Creatinine, Ser: 0.58 mg/dL (ref 0.44–1.00)
GFR calc Af Amer: >60 mL/min (ref >60)

## 2015-09-22 SURGERY — ARTHROPLASTY, KNEE, TOTAL
Anesthesia: General | Site: Knee | Laterality: Right | Wound class: Clean

## 2015-09-22 MED ORDER — SODIUM CHLORIDE 0.9 % IJ SOLN
INTRAMUSCULAR | Status: AC
  Filled 2015-09-22: qty 50

## 2015-09-22 MED ORDER — TRANEXAMIC ACID 1000 MG/10ML IV SOLN
1000.0000 mg | INTRAVENOUS | Status: AC
  Administered 2015-09-22: 1000 mg via INTRAVENOUS
  Filled 2015-09-22: qty 10

## 2015-09-22 MED ORDER — CEFAZOLIN SODIUM 1-5 GM-% IV SOLN: 1.0000 g | Freq: Once | INTRAVENOUS | Status: DC

## 2015-09-22 MED ORDER — BIOTIN 5000 MCG PO CAPS: 1.0000 | ORAL_CAPSULE | Freq: Every day | ORAL | Status: DC

## 2015-09-22 MED ORDER — ACETAMINOPHEN 650 MG RE SUPP: 650.0000 mg | Freq: Four times a day (QID) | RECTAL | Status: DC | PRN

## 2015-09-22 MED ORDER — HYDROMORPHONE HCL 1 MG/ML IJ SOLN
2.0000 mg | INTRAMUSCULAR | Status: DC | PRN
  Filled 2015-09-22: qty 2

## 2015-09-22 MED ORDER — ADULT MULTIVITAMIN W/MINERALS CH
1.0000 | ORAL_TABLET | Freq: Every day | ORAL | Status: DC
  Administered 2015-09-22 – 2015-09-24 (×3): 1 via ORAL
  Filled 2015-09-22 (×3): qty 1

## 2015-09-22 MED ORDER — PHENOL 1.4 % MT LIQD: 1.0000 | OROMUCOSAL | Status: DC | PRN

## 2015-09-22 MED ORDER — NEOMYCIN-POLYMYXIN B GU 40-200000 IR SOLN
Status: AC
  Filled 2015-09-22: qty 20

## 2015-09-22 MED ORDER — FENTANYL CITRATE (PF) 100 MCG/2ML IJ SOLN
INTRAMUSCULAR | Status: DC | PRN
  Administered 2015-09-22 (×3): 25 ug via INTRAVENOUS
  Administered 2015-09-22 (×2): 50 ug via INTRAVENOUS
  Administered 2015-09-22: 25 ug via INTRAVENOUS

## 2015-09-22 MED ORDER — ENOXAPARIN SODIUM 30 MG/0.3ML ~~LOC~~ SOLN
30.0000 mg | Freq: Two times a day (BID) | SUBCUTANEOUS | Status: DC
Start: 2015-09-23 — End: 2015-09-24
  Administered 2015-09-23 – 2015-09-24 (×3): 30 mg via SUBCUTANEOUS
  Filled 2015-09-22 (×3): qty 0.3

## 2015-09-22 MED ORDER — BUPIVACAINE-EPINEPHRINE (PF) 0.5% -1:200000 IJ SOLN
INTRAMUSCULAR | Status: AC
  Filled 2015-09-22: qty 30

## 2015-09-22 MED ORDER — METHOTREXATE 2.5 MG PO TABS
15.0000 mg | ORAL_TABLET | ORAL | Status: DC
  Administered 2015-09-23: 15 mg via ORAL
  Filled 2015-09-22: qty 6

## 2015-09-22 MED ORDER — NEOMYCIN-POLYMYXIN B GU 40-200000 IR SOLN
Status: DC | PRN
  Administered 2015-09-22: 16 mL

## 2015-09-22 MED ORDER — LACTATED RINGERS IV SOLN
INTRAVENOUS | Status: DC
  Administered 2015-09-22 (×2): via INTRAVENOUS

## 2015-09-22 MED ORDER — ACETAMINOPHEN 10 MG/ML IV SOLN
INTRAVENOUS | Status: AC
  Filled 2015-09-22: qty 100

## 2015-09-22 MED ORDER — FENTANYL CITRATE (PF) 100 MCG/2ML IJ SOLN
INTRAMUSCULAR | Status: AC
Start: 2015-09-22 — End: 2015-09-22
  Administered 2015-09-22: 25 ug via INTRAVENOUS
  Filled 2015-09-22: qty 2

## 2015-09-22 MED ORDER — VITAMIN B-12 1000 MCG PO TABS
1000.0000 ug | ORAL_TABLET | Freq: Every day | ORAL | Status: DC
  Administered 2015-09-22 – 2015-09-24 (×3): 1000 ug via ORAL
  Filled 2015-09-22 (×3): qty 1

## 2015-09-22 MED ORDER — POLYETHYLENE GLYCOL 3350 17 G PO PACK
17.0000 g | PACK | Freq: Every day | ORAL | Status: DC | PRN
Start: 2015-09-22 — End: 2015-09-25
  Administered 2015-09-23 – 2015-09-24 (×2): 17 g via ORAL
  Filled 2015-09-22 (×2): qty 1

## 2015-09-22 MED ORDER — CILIDINIUM-CHLORDIAZEPOXIDE 2.5-5 MG PO CAPS
1.0000 | ORAL_CAPSULE | Freq: Two times a day (BID) | ORAL | Status: DC | PRN
  Filled 2015-09-22: qty 1

## 2015-09-22 MED ORDER — ONDANSETRON HCL 4 MG PO TABS: 4.0000 mg | ORAL_TABLET | Freq: Four times a day (QID) | ORAL | Status: DC | PRN

## 2015-09-22 MED ORDER — OXYCODONE HCL 5 MG PO TABS
5.0000 mg | ORAL_TABLET | ORAL | Status: DC | PRN
  Administered 2015-09-22 (×2): 5 mg via ORAL
  Administered 2015-09-22 – 2015-09-23 (×7): 10 mg via ORAL
  Administered 2015-09-23: 5 mg via ORAL
  Administered 2015-09-24 (×2): 10 mg via ORAL
  Administered 2015-09-24: 5 mg via ORAL
  Administered 2015-09-24: 10 mg via ORAL
  Administered 2015-09-24: 5 mg via ORAL
  Administered 2015-09-24 – 2015-09-25 (×3): 10 mg via ORAL
  Filled 2015-09-22 (×3): qty 2
  Filled 2015-09-22 (×2): qty 1
  Filled 2015-09-22 (×8): qty 2
  Filled 2015-09-22: qty 1
  Filled 2015-09-22: qty 2
  Filled 2015-09-22: qty 1
  Filled 2015-09-22 (×3): qty 2

## 2015-09-22 MED ORDER — ONDANSETRON HCL 4 MG/2ML IJ SOLN: 4.0000 mg | Freq: Four times a day (QID) | INTRAMUSCULAR | Status: DC | PRN

## 2015-09-22 MED ORDER — ACETAMINOPHEN 325 MG PO TABS: 650.0000 mg | ORAL_TABLET | Freq: Four times a day (QID) | ORAL | Status: DC | PRN

## 2015-09-22 MED ORDER — FENTANYL CITRATE (PF) 100 MCG/2ML IJ SOLN
25.0000 ug | INTRAMUSCULAR | Status: DC | PRN
  Administered 2015-09-22 (×4): 25 ug via INTRAVENOUS

## 2015-09-22 MED ORDER — ACETAMINOPHEN 10 MG/ML IV SOLN
INTRAVENOUS | Status: DC | PRN
  Administered 2015-09-22: 1000 mg via INTRAVENOUS

## 2015-09-22 MED ORDER — BUPIVACAINE HCL (PF) 0.5 % IJ SOLN
INTRAMUSCULAR | Status: DC | PRN
  Administered 2015-09-22: 3 mL

## 2015-09-22 MED ORDER — KCL IN DEXTROSE-NACL 20-5-0.45 MEQ/L-%-% IV SOLN
INTRAVENOUS | Status: DC
  Administered 2015-09-22 – 2015-09-23 (×2): via INTRAVENOUS
  Filled 2015-09-22 (×5): qty 1000

## 2015-09-22 MED ORDER — HYDROMORPHONE HCL 1 MG/ML IJ SOLN
1.0000 mg | INTRAMUSCULAR | Status: DC | PRN
  Administered 2015-09-22: 2 mg via INTRAVENOUS
  Administered 2015-09-22: 1 mg via INTRAVENOUS
  Administered 2015-09-23 (×2): 2 mg via INTRAVENOUS
  Filled 2015-09-22 (×3): qty 2

## 2015-09-22 MED ORDER — VITAMIN D3 25 MCG (1000 UNIT) PO TABS
5000.0000 [IU] | ORAL_TABLET | Freq: Every day | ORAL | Status: DC
  Administered 2015-09-22 – 2015-09-24 (×3): 5000 [IU] via ORAL
  Filled 2015-09-22 (×7): qty 5

## 2015-09-22 MED ORDER — ONDANSETRON HCL 4 MG/2ML IJ SOLN: 4.0000 mg | Freq: Once | INTRAMUSCULAR | Status: DC | PRN

## 2015-09-22 MED ORDER — CALCIUM CARBONATE ANTACID 500 MG PO CHEW
3.0000 | CHEWABLE_TABLET | Freq: Every day | ORAL | Status: DC
  Administered 2015-09-22 – 2015-09-24 (×3): 600 mg via ORAL
  Filled 2015-09-22 (×3): qty 3

## 2015-09-22 MED ORDER — MENTHOL 3 MG MT LOZG: 1.0000 | LOZENGE | OROMUCOSAL | Status: DC | PRN

## 2015-09-22 MED ORDER — CEFAZOLIN SODIUM 1-5 GM-% IV SOLN
1.0000 g | Freq: Three times a day (TID) | INTRAVENOUS | Status: AC
  Administered 2015-09-22 – 2015-09-23 (×3): 1 g via INTRAVENOUS
  Filled 2015-09-22 (×3): qty 50

## 2015-09-22 MED ORDER — DEXAMETHASONE SODIUM PHOSPHATE 10 MG/ML IJ SOLN
INTRAMUSCULAR | Status: DC | PRN
  Administered 2015-09-22: 10 mg via INTRAVENOUS

## 2015-09-22 MED ORDER — FOLIC ACID 1 MG PO TABS
1.0000 mg | ORAL_TABLET | Freq: Every day | ORAL | Status: DC
  Administered 2015-09-22 – 2015-09-24 (×3): 1 mg via ORAL
  Filled 2015-09-22 (×3): qty 1

## 2015-09-22 MED ORDER — TRANEXAMIC ACID 1000 MG/10ML IV SOLN
INTRAVENOUS | Status: AC
  Filled 2015-09-22: qty 10

## 2015-09-22 MED ORDER — SODIUM CHLORIDE 0.9 % IV SOLN
10000.0000 ug | INTRAVENOUS | Status: DC | PRN
  Administered 2015-09-22: 30 ug/min via INTRAVENOUS

## 2015-09-22 MED ORDER — KETAMINE HCL 10 MG/ML IJ SOLN
INTRAMUSCULAR | Status: DC | PRN
  Administered 2015-09-22 (×4): 10 mg via INTRAVENOUS

## 2015-09-22 MED ORDER — FAMOTIDINE 20 MG PO TABS
20.0000 mg | ORAL_TABLET | Freq: Once | ORAL | Status: AC
  Administered 2015-09-22: 20 mg via ORAL

## 2015-09-22 MED ORDER — ALPRAZOLAM 0.5 MG PO TABS
0.5000 mg | ORAL_TABLET | Freq: Every evening | ORAL | Status: DC | PRN
  Administered 2015-09-22 – 2015-09-23 (×2): 0.5 mg via ORAL
  Filled 2015-09-22 (×2): qty 1

## 2015-09-22 MED ORDER — FAMOTIDINE 20 MG PO TABS
ORAL_TABLET | ORAL | Status: AC
  Administered 2015-09-22: 20 mg via ORAL
  Filled 2015-09-22: qty 1

## 2015-09-22 MED ORDER — CEFAZOLIN SODIUM 1-5 GM-% IV SOLN
INTRAVENOUS | Status: AC
  Administered 2015-09-22: 3 g via INTRAVENOUS
  Filled 2015-09-22: qty 50

## 2015-09-22 MED ORDER — B COMPLEX VITAMINS PO CAPS: 1.0000 | ORAL_CAPSULE | Freq: Every day | ORAL | Status: DC

## 2015-09-22 MED ORDER — MIDAZOLAM HCL 5 MG/5ML IJ SOLN
INTRAMUSCULAR | Status: DC | PRN
  Administered 2015-09-22: 1 mg via INTRAVENOUS
  Administered 2015-09-22: 2 mg via INTRAVENOUS
  Administered 2015-09-22: 1 mg via INTRAVENOUS

## 2015-09-22 MED ORDER — HYDROMORPHONE HCL 2 MG/ML IJ SOLN: 2.0000 mg | INTRAMUSCULAR | Status: DC | PRN

## 2015-09-22 MED ORDER — CALCIUM CARBONATE 1500 (600 CA) MG PO TABS
600.0000 mg | ORAL_TABLET | Freq: Every day | ORAL | Status: DC
  Filled 2015-09-22: qty 1

## 2015-09-22 MED ORDER — PROPOFOL 500 MG/50ML IV EMUL
INTRAVENOUS | Status: DC | PRN
  Administered 2015-09-22: 60 ug/kg/min via INTRAVENOUS

## 2015-09-22 MED ORDER — BUPIVACAINE LIPOSOME 1.3 % IJ SUSP
INTRAMUSCULAR | Status: AC
  Filled 2015-09-22: qty 20

## 2015-09-22 MED ORDER — ONDANSETRON HCL 4 MG/2ML IJ SOLN
INTRAMUSCULAR | Status: DC | PRN
  Administered 2015-09-22: 4 mg via INTRAVENOUS

## 2015-09-22 SURGICAL SUPPLY — 60 items
BLADE SAGITTAL AGGR TOOTH XLG (BLADE) ×3 IMPLANT
BLADE SAW 1/2 (BLADE) ×3 IMPLANT
BLADE SAW SAG 29X58X.64 (BLADE) ×3 IMPLANT
BLADE SURG 15 STRL LF DISP TIS (BLADE) ×1 IMPLANT
BLADE SURG 15 STRL SS (BLADE) ×3
BNDG COHESIVE 6X5 TAN STRL LF (GAUZE/BANDAGES/DRESSINGS) ×3 IMPLANT
BOWL CEMENT MIXING ADV NOZZLE (MISCELLANEOUS) ×1 IMPLANT
CANISTER SUCT 1200ML W/VALVE (MISCELLANEOUS) ×3 IMPLANT
CANISTER SUCT 3000ML (MISCELLANEOUS) ×3 IMPLANT
CAPT KNEE TRIATH TK-4 ×2 IMPLANT
CATH TRAY METER 16FR LF (MISCELLANEOUS) ×3 IMPLANT
CHLORAPREP W/TINT 26ML (MISCELLANEOUS) ×9 IMPLANT
COOLER POLAR GLACIER W/PUMP (MISCELLANEOUS) ×3 IMPLANT
DECANTER SPIKE VIAL GLASS SM (MISCELLANEOUS) ×4 IMPLANT
DRAPE INCISE IOBAN 66X45 STRL (DRAPES) ×3 IMPLANT
DRAPE SHEET LG 3/4 BI-LAMINATE (DRAPES) ×5 IMPLANT
DRSG AQUACEL AG ADV 3.5X10 (GAUZE/BANDAGES/DRESSINGS) ×3 IMPLANT
DRSG OPSITE POSTOP 4X10 (GAUZE/BANDAGES/DRESSINGS) ×3 IMPLANT
EXPRESS FOOT CUFF ×4 IMPLANT
GAUZE PETRO XEROFOAM 1X8 (MISCELLANEOUS) ×3 IMPLANT
GAUZE SPONGE 4X4 12PLY STRL (GAUZE/BANDAGES/DRESSINGS) ×3 IMPLANT
GLOVE BIO SURGEON STRL SZ8 (GLOVE) ×12 IMPLANT
GLOVE BIOGEL M STRL SZ7.5 (GLOVE) ×3 IMPLANT
GLOVE INDICATOR 8.0 STRL GRN (GLOVE) ×6 IMPLANT
GOWN STRL REUS W/ TWL LRG LVL3 (GOWN DISPOSABLE) ×2 IMPLANT
GOWN STRL REUS W/TWL LRG LVL3 (GOWN DISPOSABLE) ×6
GOWN STRL REUS W/TWL LRG LVL4 (GOWN DISPOSABLE) ×6 IMPLANT
HANDPIECE SUCTION TUBG SURGILV (MISCELLANEOUS) ×3 IMPLANT
HOLDER KNEE ALVARADO LINER (Liner) ×3 IMPLANT
HOOD PEEL AWAY FLYTE STAYCOOL (MISCELLANEOUS) ×6 IMPLANT
KIT RM TURNOVER STRD PROC AR (KITS) ×3 IMPLANT
NDL SAFETY 18GX1.5 (NEEDLE) ×3 IMPLANT
NDL SAFETY 22GX1.5 (NEEDLE) ×3 IMPLANT
NDL SPNL 18GX3.5 QUINCKE PK (NEEDLE) ×1 IMPLANT
NDL SPNL 20GX3.5 QUINCKE YW (NEEDLE) ×2 IMPLANT
NEEDLE SPNL 18GX3.5 QUINCKE PK (NEEDLE) ×3 IMPLANT
NEEDLE SPNL 20GX3.5 QUINCKE YW (NEEDLE) ×6 IMPLANT
NS IRRIG 1000ML POUR BTL (IV SOLUTION) ×3 IMPLANT
PACK TOTAL KNEE (MISCELLANEOUS) ×3 IMPLANT
PAD ABD DERMACEA PRESS 5X9 (GAUZE/BANDAGES/DRESSINGS) ×3 IMPLANT
PAD GROUND ADULT SPLIT (MISCELLANEOUS) ×3 IMPLANT
PAD WRAPON POLAR KNEE (MISCELLANEOUS) ×1 IMPLANT
SOL .9 NS 3000ML IRR  AL (IV SOLUTION) ×2
SOL .9 NS 3000ML IRR AL (IV SOLUTION) ×1
SOL .9 NS 3000ML IRR UROMATIC (IV SOLUTION) ×1 IMPLANT
SOL PREP PVP 2OZ (MISCELLANEOUS) ×3
SOLUTION PREP PVP 2OZ (MISCELLANEOUS) ×1 IMPLANT
STAPLER SKIN PROX 35W (STAPLE) ×3 IMPLANT
SUCTION FRAZIER TIP 10 FR DISP (SUCTIONS) ×3 IMPLANT
SUT ETHIBOND CT1 BRD #0 30IN (SUTURE) ×6 IMPLANT
SUT ETHIBOND NAB CT1 #1 30IN (SUTURE) ×9 IMPLANT
SUT VIC AB 0 CT1 36 (SUTURE) ×6 IMPLANT
SUT VIC AB 2-0 CT1 27 (SUTURE) ×6
SUT VIC AB 2-0 CT1 TAPERPNT 27 (SUTURE) ×2 IMPLANT
SYR 20CC LL (SYRINGE) ×3 IMPLANT
SYR 30ML LL (SYRINGE) ×3 IMPLANT
SYR 50ML LL SCALE MARK (SYRINGE) ×3 IMPLANT
SYRINGE 10CC LL (SYRINGE) ×3 IMPLANT
WATER STERILE IRR 1000ML POUR (IV SOLUTION) ×1 IMPLANT
WRAPON POLAR PAD KNEE (MISCELLANEOUS) ×3

## 2015-09-22 NOTE — Anesthesia Postprocedure Evaluation (Signed)
Anesthesia Post Note  Patient: Michelle Jordan  Procedure(s) Performed: Procedure(s) (LRB): TOTAL KNEE ARTHROPLASTY (Right)  Patient location during evaluation: PACU Anesthesia Type: General Level of consciousness: awake and alert Pain management: pain level controlled Vital Signs Assessment: post-procedure vital signs reviewed and stable Respiratory status: spontaneous breathing, nonlabored ventilation, respiratory function stable and patient connected to nasal cannula oxygen Cardiovascular status: blood pressure returned to baseline and stable Postop Assessment: no signs of nausea or vomiting Anesthetic complications: no    Last Vitals:  Filed Vitals:   09/22/15 1223 09/22/15 1238  BP: 143/67 129/72  Pulse: 67 76  Temp:    Resp: 14 17    Last Pain:  Filed Vitals:   09/22/15 1247  PainSc: 0-No pain                 Molli Barrows

## 2015-09-22 NOTE — Evaluation (Signed)
Physical Therapy Evaluation Patient Details Name: Michelle Jordan MRN: [id number] DOB: May 24, 1956 Today's Date: 09/22/2015   History of Present Illness  Patient is a 59 y/o female that presents for R TKR on 09/22/2015.  Clinical Impression  Patient is seen for evaluation on POD #0 and demonstrates appropriate bed mobility, though decreased knee ROM, transfers, and gait tolerance secondary to high baseline level of pain. Patient participates well with all mobility in this session and demonstrates appropriate step length/weight bearing symmetry. Patient appears to be progressing well towards established mobility goals and would continue to benefit from skilled PT services to address her mobility deficits.     Follow Up Recommendations Home health PT    Equipment Recommendations  Rolling walker with 5" wheels    Recommendations for Other Services       Precautions / Restrictions Precautions Precautions: Knee Precaution Booklet Issued: No Restrictions Weight Bearing Restrictions: Yes RLE Weight Bearing: Weight bearing as tolerated      Mobility  Bed Mobility Overal bed mobility: Needs Assistance Bed Mobility: Supine to Sit     Supine to sit: Supervision     General bed mobility comments: Patient had HOB elevated and requires cuing for use of hands rails and technique to bring LEs off the edge of the bed. No assistance required.   Transfers Overall transfer level: Needs assistance Equipment used: Rolling walker (2 wheeled) Transfers: Sit to/from Stand Sit to Stand: Min guard         General transfer comment: Patient required cuing for use of hand grips on RW, appropriate speed and no loss of balance.   Ambulation/Gait Ambulation/Gait assistance: Min guard Ambulation Distance (Feet): 3 Feet Assistive device: Rolling walker (2 wheeled) Gait Pattern/deviations: Step-through pattern;Decreased step length - right;Decreased step length - left   Gait velocity  interpretation: Below normal speed for age/gender General Gait Details: Patient is able to take several steps of appropriate length with no loss of balance.   Stairs            Wheelchair Mobility    Modified Rankin (Stroke Patients Only)       Balance                                             Pertinent Vitals/Pain Pain Assessment: 0-10 Pain Score: 6  Pain Location: R knee  Pain Descriptors / Indicators: Aching;Cramping Pain Intervention(s): Limited activity within patient's tolerance;Monitored during session;Premedicated before session;Repositioned;Ice applied    Home Living Family/patient expects to be discharged to:: Private residence Living Arrangements: Alone Available Help at Discharge: Available PRN/intermittently (Boyfriend available most of the day) Type of Home: House Home Access: Stairs to enter   [employer] of Steps: 1+1 Home Layout: One level Home Equipment: Environmental consultant - 4 wheels;Walker - standard;Cane - single point;Shower seat      Prior Function Level of Independence: Independent with assistive device(s)         Comments: Patient had been intermittently using a SPC, denies any falls.      Hand Dominance        Extremity/Trunk Assessment   Upper Extremity Assessment: Overall WFL for tasks assessed           Lower Extremity Assessment: Overall WFL for tasks assessed;RLE deficits/detail RLE Deficits / Details: Able to complete 2 sets of SLRs on RLE with no quad lag.  Communication   Communication: No difficulties  Cognition Arousal/Alertness: Awake/alert Behavior During Therapy: WFL for tasks assessed/performed Overall Cognitive Status: Within Functional Limits for tasks assessed                      General Comments      Exercises Total Joint Exercises Ankle Circles/Pumps: AROM;Both;10 reps Heel Slides: AROM;AAROM;Both;10 reps Hip ABduction/ADduction: AROM;Both;10 reps Straight  Leg Raises: AROM;Both;20 reps Long Arc Quad: AROM;Both;10 reps Goniometric ROM: 6-51      Assessment/Plan    PT Assessment Patient needs continued PT services  PT Diagnosis Difficulty walking;Generalized weakness   PT Problem List Decreased strength;Decreased knowledge of use of DME;Decreased range of motion;Decreased activity tolerance;Decreased balance;Decreased mobility  PT Treatment Interventions DME instruction;Gait training;Stair training;Therapeutic activities;Therapeutic exercise;Balance training   PT Goals (Current goals can be found in the Care Plan section) Acute Rehab PT Goals Patient Stated Goal: To return home safely  PT Goal Formulation: With patient Time For Goal Achievement: 10/06/15 Potential to Achieve Goals: Good    Frequency BID   Barriers to discharge        Co-evaluation               End of Session Equipment Utilized During Treatment: Gait belt Activity Tolerance: Patient tolerated treatment well Patient left: in chair;with call bell/phone within reach;with chair alarm set Nurse Communication: Mobility status         Time: [id number] PT Time Calculation (min) (ACUTE ONLY): 22 min   Charges:   PT Evaluation $Initial PT Evaluation Tier I: 1 Procedure PT Treatments $Therapeutic Exercise: 8-22 mins   PT G Codes:       Kerman Passey, PT, DPT    09/22/2015, 5:09 PM

## 2015-09-22 NOTE — Progress Notes (Signed)

## 2015-09-22 NOTE — Transfer of Care (Signed)
Immediate Anesthesia Transfer of Care Note  Patient: Michelle Jordan  Procedure(s) Performed: Procedure(s): TOTAL KNEE ARTHROPLASTY (Right)  Patient Location: PACU  Anesthesia Type:General  Level of Consciousness: awake, alert  and oriented  Airway & Oxygen Therapy: Patient Spontanous Breathing  Post-op Assessment: Report given to RN and Post -op Vital signs reviewed and stable  Post vital signs: Reviewed and stable  Last Vitals:  Filed Vitals:   09/22/15 0614  BP: 148/87  Pulse: 88  Temp: 36.6 C  Resp: 16    Complications: No apparent anesthesia complications

## 2015-09-22 NOTE — Progress Notes (Signed)
Patient having total knee replacement this morning and very nervous.  Gave pastoral care and prayer. Sims 1200

## 2015-09-22 NOTE — Progress Notes (Signed)
Patient arrived from PACU this shift after right knee replacement with Dr. Jefm Bryant.  Patient has sensation with mild tingling in right foot upon arrival and was medicated in PACU with Fentayl.  PT to work with patient after patient receives some pain medication on unit.  Patient wishes to go home upon discharge, will need DME as has a rolling walker with seat currently which is not appropriate at discharge.

## 2015-09-22 NOTE — H&P (Signed)
  H and P reviewed. No changes. Uploaded at later date. 

## 2015-09-22 NOTE — Anesthesia Procedure Notes (Signed)
Spinal Patient location during procedure: OR Start time: 09/22/2015 7:54 AM Staffing Anesthesiologist: Molli Barrows Performed by: anesthesiologist  Preanesthetic Checklist Completed: patient identified, site marked, surgical consent, pre-op evaluation, timeout performed, IV checked, risks and benefits discussed and monitors and equipment checked Spinal Block Patient position: sitting Prep: ChloraPrep Patient monitoring: heart rate, continuous pulse ox, blood pressure and cardiac monitor Approach: midline Location: L3-4 Injection technique: single-shot Needle Needle type: Whitacre and Introducer  Needle gauge: 24 G Needle length: 9 cm Needle insertion depth: 9 cm Additional Notes Negative paresthesia. Negative blood return. Positive free-flowing CSF. Expiration date of kit checked and confirmed. Patient tolerated procedure well, without complications.

## 2015-09-22 NOTE — Anesthesia Preprocedure Evaluation (Signed)
Anesthesia Evaluation  Patient identified by MRN, date of birth, ID band Patient awake    Reviewed: Allergy & Precautions, H&P , NPO status , Patient's Chart, lab work & pertinent test results, reviewed documented beta blocker date and time   History of Anesthesia Complications (+) PONV and history of anesthetic complications  Airway Mallampati: II  TM Distance: >3 FB Neck ROM: full    Dental no notable dental hx. (+) Teeth Intact   Pulmonary neg pulmonary ROS,    Pulmonary exam normal breath sounds clear to auscultation       Cardiovascular Exercise Tolerance: Good hypertension, On Medications negative cardio ROS  + Valvular Problems/Murmurs  Rhythm:regular Rate:Normal     Neuro/Psych  Headaches, negative neurological ROS  negative psych ROS   GI/Hepatic negative GI ROS, Neg liver ROS, hiatal hernia,   Endo/Other  negative endocrine ROSHypothyroidism   Renal/GU negative Renal ROS  negative genitourinary   Musculoskeletal   Abdominal   Peds  Hematology negative hematology ROS (+)   Anesthesia Other Findings   Reproductive/Obstetrics negative OB ROS                             Anesthesia Physical Anesthesia Plan  ASA: III  Anesthesia Plan: General and Spinal   Post-op Pain Management:    Induction:   Airway Management Planned:   Additional Equipment:   Intra-op Plan:   Post-operative Plan:   Informed Consent: I have reviewed the patients History and Physical, chart, labs and discussed the procedure including the risks, benefits and alternatives for the proposed anesthesia with the patient or authorized representative who has indicated his/her understanding and acceptance.   Dental Advisory Given  Plan Discussed with: CRNA  Anesthesia Plan Comments:         Anesthesia Quick Evaluation

## 2015-09-22 NOTE — Op Note (Signed)
DATE OF SURGERY:  09/22/2015  PATIENT NAME:  Michelle Jordan   DOB: 04/05/1956  MRN: [id number]  PRE-OPERATIVE DIAGNOSIS: Degenerative arthrosis of the right knee  POST-OPERATIVE DIAGNOSIS:  Degenerative arthrosis of the right knee  PROCEDURE:  Right total knee arthroplasty  SURGEON:  Dr.Areil Ottey Leeanne Mannan. M.D.  ANESTHESIA: Spinal  ESTIMATED BLOOD LOSS: About 100 mL  TOURNIQUET TIME: 139 minutes   IMPLANTS UTILIZED: Triathlon #4 posterior stabilized femoral component, #4 tibial component, #4 9 mm thick tibial bearing insert, 10 mm thick asymmetric patella(A35)  INDICATIONS FOR SURGERY: Michelle Jordan is a 59 y.o. year old female with a long history of progressive knee pain. X-rays demonstrated severe degenerative changes in tricompartmental fashion. The patient had not seen any significant improvement despite conservative nonsurgical intervention. After discussion of the risks and benefits of surgical intervention, the patient expressed understanding of the risks benefits and agreed with plans for total knee arthroplasty.   The risks, benefits, and alternatives were discussed at length including but not limited to the risks of infection, bleeding, nerve injury, stiffness, blood clots, the need for revision surgery, cardiopulmonary complications, among others, and they were willing to proceed.  PROCEDURE:   The patient was taken to the operating room where satisfactory spinal anesthesia was achieved. A Foley catheter was inserted. The patient was given IV Kefzol prior to start of the procedure. A tourniquet was applied to right upper thigh. Right lower extremity was prepped and draped in the usual fashion for a total knee procedure. The right lower extremity was then exsanguinated, and the tourniquet was inflated. A medial parapatella incision was made.. Dissection was carried down through the subcutaneous tissue onto the capsule. This was divided in line with the incision. The knee was  then inspected. There was significant erosion of all 3 compartments .  I went ahead and drilled a hole in the intercondylar notch. Intramedullary rod was inserted. On the rod was the distal femoral cutting block. It was set for  5 degree valgus cut. The cutting block was fixed to the distal femur with smooth pins and then the intramedullary road was removed. The distal femoral cut was made, removing 10 mm of bone. I then went ahead and removed some medial and lateral compartment meniscal remnants. ACL and PCL were excised. The knee was hyperflexed. The femoral sizing guide was placed on the distal femur and was lined up with the epicondylar axis. It was felt that #4 femoral component was going to be the appropriate size. I went ahead and impacted the 4 in 1 cutting guide onto the distal femur. Anterior and posterior cuts were made followed by the anterior and posterior chamfer cuts. The intercondylar area was notched out using the appropriate guide.   External alignment jig was placed on the right leg and set for a cut 9 mm below the lateral tibial plateau. The cut was sloped 3. I went ahead and made this cut without difficulty. I removed the cut bone from the proximal tibial which gave me a flush cut across the tibial surface. I then inserted a  gap spacer with the knee extended, and indeed it was felt that I could easily fit a  9 mm tibial bearing insert into the extension space.  I then sized the proximal tibia for a #4 base plate. With the #4 femoral componentin place and the #4 base plate in place, I was able to insert a 9 mm trial tibial-bearing insert. With all the trials in place, the  knee fully extended and flexed well. I then drilled holes through the trial femoral component for the pegs on the permanent femoral component. Next I resected 11mm of bone from retropatellar surface. I made peg holes for the A35 patellar implant. Trial patella was inserted. It tracked well.   The trials were removed. The  tibial base plate was positioned appropriately for the proximal tibial cruciate cut. The proximal tibial cruciate cut was made.    I then sequentially impacted the components. The #4  tibial base plate was impacted onto the proximal tibia.  I then impacted the #4 femoral component onto the distal femur  I then inserted a trial 9 mm spacer. The knee was extended.  I went ahead and applied a 59mm thick asymmetric patella to the retropatellar surface.  I then removed the trial tibial spacer and inserted a permanent 9 mm tibial spacer. No methylmethacrylate was used since all the metallic components were porous coated. The knee came to full extension and flexed well. The knee seemed to be reasonably stable.   Tourniquet was released at this time. TXA given IV prior to tourniquet release. Tourniquet was up for about 139 minutes.  Bleeding was controlled with coagulation cautery. The wound was irrigated with GU irrigant.  I also injected the capsule and subcutaneous tissue with about 60 cc of Exparel and saline mixture. I also injected the same tissues with about 30 cc of 0.5% Marcaine with epinephrine. I went ahead and closed the capsule with # 1 and #0 ethibond sutures , subcu with  2-0 Vicryl, and skin with skin staples. Subsequent to the closure, I did go ahead and apply a sterile dressing and 4 TENS pads. Polar Care cooling pad was applied.  The patient was then transferred to a stretcher bed and taken to the recovery room in satisfactory condition. Blood loss was about 100 cc.        Dr.Anesia Blackwell Leeanne Mannan. M.D.

## 2015-09-23 MED ORDER — TRAMADOL HCL 50 MG PO TABS
50.0000 mg | ORAL_TABLET | Freq: Four times a day (QID) | ORAL | Status: DC | PRN
  Administered 2015-09-23 – 2015-09-24 (×2): 100 mg via ORAL
  Filled 2015-09-23 (×2): qty 2

## 2015-09-23 MED ORDER — MEPERIDINE HCL 25 MG/ML IJ SOLN
25.0000 mg | Freq: Once | INTRAMUSCULAR | Status: AC
Start: 2015-09-23 — End: 2015-09-23
  Administered 2015-09-23: 25 mg via INTRAVENOUS
  Filled 2015-09-23: qty 1

## 2015-09-23 MED ORDER — MEPERIDINE HCL 25 MG/ML IJ SOLN: 25.0000 mg | INTRAMUSCULAR | Status: DC | PRN

## 2015-09-23 MED ORDER — DIPHENHYDRAMINE HCL 25 MG PO CAPS
25.0000 mg | ORAL_CAPSULE | Freq: Four times a day (QID) | ORAL | Status: DC | PRN
  Administered 2015-09-23 – 2015-09-25 (×5): 25 mg via ORAL
  Filled 2015-09-23 (×5): qty 1

## 2015-09-23 NOTE — Progress Notes (Signed)
Physical Therapy Treatment Patient Details Name: Michelle Jordan MRN: [id number] DOB: August 12, 1956 Today's Date: 09/23/2015    History of Present Illness Patient is a 58 y/o female that presents for R TKR on 09/22/2015.    PT Comments    Pt is making good progress towards goals with increased ambulation this date. Therapist adjusted BRW and it allowed pt to demonstrate improved upright posture. Pt motivated to perform therapy, however fatigues quickly with increased distance. Good endurance noted with HEP.   Follow Up Recommendations  Supervision for mobility/OOB;Home health PT     Equipment Recommendations  3in1 (PT) (BRW)    Recommendations for Other Services       Precautions / Restrictions Precautions Precautions: Knee;Fall Precaution Booklet Issued: Yes (comment) Restrictions Weight Bearing Restrictions: Yes RLE Weight Bearing: Weight bearing as tolerated    Mobility  Bed Mobility Overal bed mobility: Needs Assistance Bed Mobility: Supine to Sit     Supine to sit: Min guard     General bed mobility comments: Safe technique performed with level bed.   Transfers Overall transfer level: Needs assistance Equipment used: Rolling walker (2 wheeled) Transfers: Sit to/from Stand Sit to Stand: Min guard         General transfer comment: cues given for pushing from seated surface. Once standing, pt able to stand with supervision  Ambulation/Gait Ambulation/Gait assistance: Min guard Ambulation Distance (Feet): 70 Feet Assistive device:  (BRW) Gait Pattern/deviations: Step-to pattern     General Gait Details: ambulates with short step length on L LE with heavy UE use on BRW. Pt with slow gait speed and fatigues quickly, needing to turn around   [employer] Mobility    Modified Rankin (Stroke Patients Only)       Balance                                    Cognition Arousal/Alertness: Awake/alert Behavior During  Therapy: WFL for tasks assessed/performed Overall Cognitive Status: Within Functional Limits for tasks assessed                      Exercises Total Joint Exercises Goniometric ROM: R knee AAROM 7-68 degrees Other Exercises Other Exercises: supine ther-ex performed x 12 reps including R ankle pumps, quad sets, glut sets, SAQ, SLR, and hip abd/add. All performed with min assist for completion. Other Exercises: Pt ambulated to [facility], needing cga for set up and direction. Safe technique performed. Pt also to perform self hygiene.    General Comments        Pertinent Vitals/Pain Pain Assessment: 0-10 Pain Score: 8  Pain Location: R knee Pain Descriptors / Indicators: Operative site guarding Pain Intervention(s): Limited activity within patient's tolerance    Home Living Family/patient expects to be discharged to:: Private residence Living Arrangements: Alone Available Help at Discharge:  (boy friend available ) Type of Home: House Home Access: Stairs to enter   Home Layout: One level Home Equipment: Environmental consultant - 4 wheels;Walker - standard;Cane - single point;Shower seat      Prior Function Level of Independence: Independent with assistive device(s)      Comments: single point cane use PRN   PT Goals (current goals can now be found in the care plan section) Acute Rehab PT Goals Patient Stated Goal: Return home PT Goal Formulation: With patient Time For Goal  Achievement: 10/06/15 Potential to Achieve Goals: Good Progress towards PT goals: Progressing toward goals    Frequency  BID    PT Plan Current plan remains appropriate    Co-evaluation             End of Session Equipment Utilized During Treatment: Gait belt Activity Tolerance: Patient tolerated treatment well Patient left: in bed;with bed alarm set;with family/visitor present     Time: [id number] PT Time Calculation (min) (ACUTE ONLY): 39 min  Charges:  $Gait Training: 8-22 mins $Therapeutic  Exercise: 8-22 mins $Therapeutic Activity: 8-22 mins                    G Codes:      Daxson Reffett Oct 09, 2015, 2:27 PM  Greggory Stallion, PT, DPT [phone]

## 2015-09-23 NOTE — Progress Notes (Signed)
Patient cooperative with PT this shift.  DME delivered to room, plan to discharge to home at time of discharge.  Pain is being managed with PO medication.

## 2015-09-23 NOTE — Progress Notes (Signed)
Subjective: 1 Day Post-Op Procedure(s) (LRB): TOTAL KNEE ARTHROPLASTY (Right) Patient reports pain as moderate.   Patient is well, and has had no acute complaints or problems Plan is to go home with homehealth PT after hospital stay. Negative for chest pain and shortness of breath Fever: no Gastrointestinal:Negative for nausea and vomiting  Objective: Vital signs in last 24 hours: Temp:  [96.9 F (36.1 C)-98.7 F (37.1 C)] 98.4 F (36.9 C) (12/29 [id number]) Pulse Rate:  [53-115] 115 (12/29 0637) Resp:  [12-20] 18 (12/29 0637) BP: (126-182)/(54-95) 141/73 mmHg (12/29 0637) SpO2:  [94 %-100 %] 94 % (12/29 0637) Weight:  [121 kg (266 lb 12.1 oz)] 121 kg (266 lb 12.1 oz) (12/28 1400)  Intake/Output from previous day:  Intake/Output Summary (Last 24 hours) at 09/23/15 0700 Last data filed at 09/23/15 0600  Gross per 24 hour  Intake 4003.75 ml  Output   2135 ml  Net 1868.75 ml    Intake/Output this shift: Total I/O In: 1583.8 [P.O.:480; I.V.:1003.8; IV Piggyback:100] Out: 950 [Urine:950]  Labs:  Recent Labs  09/22/15 1411  HGB 12.7    Recent Labs  09/22/15 1411  WBC 11.1*  RBC 4.52  HCT 39.9  PLT 218    Recent Labs  09/22/15 1411  CREATININE 0.58   No results for input(s): LABPT, INR in the last 72 hours.   EXAM General - Patient is Alert, Appropriate and Oriented Extremity - ABD soft Neurovascular intact Sensation intact distally Dorsiflexion/Plantar flexion intact Incision: dressing C/D/I No cellulitis present Dressing/Incision - clean, dry, no drainage to the bulky dressing placed on the right knee. Motor Function - intact, moving foot and toes well on exam.   Abdomen soft and non-tender on exam. Bulky dressing intact, will replace tomorrow.  Past Medical History  Diagnosis Date  . Diverticulosis of colon (without mention of hemorrhage)   . Gout, unspecified   . Other abnormal glucose   . Other and unspecified hyperlipidemia   . Unspecified  essential hypertension   . Irritable bowel syndrome   . Obesity, unspecified   . Other psoriasis   . Rosacea   . Heart murmur     no prophylaxis--? valve  . Plantar fasciitis   . History of shingles   . TMJ (temporomandibular joint disorder)   . AC (acromioclavicular) joint bone spurs     feet  . Complication of anesthesia   . PONV (postoperative nausea and vomiting)     nausea only with foot surgery in Jan. 2016, otherwise no nausea  . Anxiety   . History of hiatal hernia   . Hypothyroidism     per pt, was dx in 1978, previosly on Synthroid, but has not taken it in years  . Headache   . Migraine headache   . Migraine-cluster headache syndrome   . Osteoarthrosis, unspecified whether generalized or localized, unspecified site     knees, cervical spine  . Psoriatic arthritis mutilans (HCC)     methotrexate  . Epicondylitis, lateral     right  . Shingles   . Stress fracture of right foot     Assessment/Plan: 1 Day Post-Op Procedure(s) (LRB): TOTAL KNEE ARTHROPLASTY (Right) Active Problems:   S/P total knee replacement  Estimated body mass index is 45.77 kg/(m^2) as calculated from the following:   Height as of this encounter: 5\' 4"  (1.626 m).   Weight as of this encounter: 121 kg (266 lb 12.1 oz). Advance diet Up with therapy D/C IV fluids when tolerating PO intake.  Will add benadryl to the patient medication list. Foley has been removed. Labs not returned yet, will observe later.  Will order CBC and BMP for tomorrow morning. Pt to work on having a BM.  DVT Prophylaxis - Lovenox, Foot Pumps and TED hose Weight-Bearing as tolerated to right leg  J. Cameron Proud, PA-C [facility] 09/23/2015, 7:00 AM

## 2015-09-23 NOTE — Care Management Note (Signed)
Case Management Note  Patient Details  Name: Michelle Jordan MRN: [id number] Date of Birth: 01/02/1956  Subjective/Objective:                  Met with patient to discuss discharge planning. She states she has had bilateral ankle surgeries in the past and not concerned with returning home alone. She states that her boyfriend is "up the road" and can help if needed. She states that "Dr. Kernodle told her that she would only need to take Aspirin 325 mg twice daily at discharge and will have something that goes on her lower extremities to help prevent DVTs". She will need a bariatric walker prior to discharge. She has a rollator and a knee scooter at home.   Action/Plan: List of home health agencies left with patient- will follow up with patient. Bariatric walker requested from Will with [facility] along with Rx from Lance PA. RNCM will continue to follow.  Expected Discharge Date:  09/25/15               Expected Discharge Plan:     In-House Referral:     Discharge planning Services  CM Consult  Post Acute Care Choice:  Durable Medical Equipment, Home Health Choice offered to:  Patient  DME Arranged:  Walker rolling (bariatric) DME Agency:  [facility]  HH Arranged:    HH Agency:     Status of Service:  In process, will continue to follow  Medicare Important Message Given:    Date Medicare IM Given:    Medicare IM give by:    Date Additional Medicare IM Given:    Additional Medicare Important Message give by:     If discussed at Long Length of Stay Meetings, dates discussed:    Additional Comments:   , RN 09/23/2015, 11:26 AM  

## 2015-09-23 NOTE — Evaluation (Signed)
Occupational Therapy Evaluation Patient Details Name: Michelle Jordan MRN: [id number] DOB: 1955-12-01 Today's Date: 09/23/2015    History of Present Illness Patient is a 59 y/o female that presents for R TKR on 09/22/2015.   Clinical Impression   This patient is a 59 year old female who came to [facility] for a R total knee replacement.  Patient lives in a  home with alone, but has a boy friend that lives close and is available.  She had been independent with ADL and functional mobility with occasional single point cane use. She now requires assistance for lower body dressing and would benefit from Occupational Therapy for ADL/functional mobility training.      Follow Up Recommendations   (Will most likely not need OT after discharge.)    Equipment Recommendations       Recommendations for Other Services       Precautions / Restrictions Precautions Precautions: Knee;Fall Restrictions RLE Weight Bearing: Weight bearing as tolerated      Mobility Bed Mobility                  Transfers                      Balance                                            ADL                                         General ADL Comments: Patient had been independent with ADL and mobility with occasional single point cane use. Today patient practiced techniques for lower body dressing including Donned/doffed socks and pants to knees (drain still in place). Patient required minimal assist and verbal cues for technique and safety. She did require hip kit to reach her feed to put pants over legs and donn and doff socks.       Vision     Perception     Praxis      Pertinent Vitals/Pain Pain Score: 7  Pain Location: R Knee Pain Descriptors / Indicators: Aching Pain Intervention(s):  (RN gave meds after session)     Hand Dominance     Extremity/Trunk Assessment Upper Extremity Assessment Upper Extremity  Assessment: Overall WFL for tasks assessed   Lower Extremity Assessment Lower Extremity Assessment: Defer to PT evaluation       Communication Communication Communication: No difficulties   Cognition Arousal/Alertness: Awake/alert Behavior During Therapy: WFL for tasks assessed/performed Overall Cognitive Status: Within Functional Limits for tasks assessed                     General Comments       Exercises       Shoulder Instructions      Home Living Family/patient expects to be discharged to:: Private residence Living Arrangements: Alone Available Help at Discharge:  (boy friend available ) Type of Home: House Home Access: Stairs to enter [employer] of Steps: 2   Home Layout: One level     Bathroom Shower/Tub: Tub/shower unit         Home Equipment: Environmental consultant - 4 wheels;Walker - standard;Cane - single point;Shower seat  Prior Functioning/Environment Level of Independence: Independent with assistive device(s) including working full time.       Comments: single point cane use PRN    OT Diagnosis: Acute pain   OT Problem List: Decreased range of motion;Decreased activity tolerance;Decreased knowledge of use of DME or AE;Pain   OT Treatment/Interventions: Self-care/ADL training    OT Goals(Current goals can be found in the care plan section) Acute Rehab OT Goals Patient Stated Goal: Return home OT Goal Formulation: With patient Time For Goal Achievement: 10/07/15 Potential to Achieve Goals: Good  OT Frequency: Min 1X/week   Barriers to D/C:            Co-evaluation              End of Session Equipment Utilized During Treatment:  (hip kit)  Activity Tolerance:   Patient left: in chair;with call bell/phone within reach;with chair alarm set   Time: 9767-3419 OT Time Calculation (min): 31 min Charges:  OT General Charges $OT Visit: 1 Procedure OT Evaluation $Initial OT Evaluation Tier I: 1 Procedure OT  Treatments $Self Care/Home Management : 8-22 mins G-Codes:    Myrene Galas, MS/OTR/L  09/23/2015, 11:55 AM

## 2015-09-23 NOTE — Progress Notes (Signed)
Patient complaining of pain intensifying.  Per MD add one time dose of demerol.  Advise patient to alternate between tramadol and oxycodone.

## 2015-09-23 NOTE — Progress Notes (Signed)
Physical Therapy Treatment Patient Details Name: Michelle Jordan MRN: [id number] DOB: 05/31/1956 Today's Date: 09/23/2015    History of Present Illness Patient is a 59 y/o female that presents for R TKR on 09/22/2015.    PT Comments    Pt is making good progress towards goals with increased ambulation this date. Pt still requires slight assist for bed mobility and needs BRW increased stability. Pt very motivated to perform therapy. +2 assist required for equipment management. Limited progress with ROM.  Follow Up Recommendations  Supervision for mobility/OOB;Home health PT     Equipment Recommendations  3in1 (PT) (BRW)    Recommendations for Other Services       Precautions / Restrictions Precautions Precautions: Knee;Fall Precaution Booklet Issued: No Restrictions Weight Bearing Restrictions: Yes RLE Weight Bearing: Weight bearing as tolerated    Mobility  Bed Mobility Overal bed mobility: Needs Assistance Bed Mobility: Supine to Sit     Supine to sit: Min guard     General bed mobility comments: Patient had HOB elevated and requires cuing for use of hands rails and technique to bring LEs off the edge of the bed. No assistance required.   Transfers Overall transfer level: Needs assistance Equipment used: Rolling walker (2 wheeled) Transfers: Sit to/from Stand Sit to Stand: Min guard         General transfer comment: cues given for pushing from seated position. BRW used for transfer as it gives wider BOS.  Ambulation/Gait Ambulation/Gait assistance: Min guard Ambulation Distance (Feet): 40 Feet Assistive device:  (BRW) Gait Pattern/deviations: Step-to pattern     General Gait Details: ambulates with antalgic gait pattern. Step to gait pattern performed. Pt with heavy B UE use on rw. Safe technique performed. Pt gets fatigued with increased distance, unable to ambulate further this session   Stairs            Wheelchair Mobility    Modified  Rankin (Stroke Patients Only)       Balance                                    Cognition Arousal/Alertness: Awake/alert Behavior During Therapy: WFL for tasks assessed/performed Overall Cognitive Status: Within Functional Limits for tasks assessed                      Exercises Total Joint Exercises Goniometric ROM: R knee AAROM 7-68 degrees Other Exercises Other Exercises: Supine ther-ex performed x 12 reps including ankle pumps, quad sets, glut sets, hip abd/add, and SLRs. All ther-ex performed x min assist for correct technique.    General Comments        Pertinent Vitals/Pain Pain Assessment: 0-10 Pain Score: 8  Pain Location: R knee Pain Descriptors / Indicators: Operative site guarding Pain Intervention(s): Limited activity within patient's tolerance;Premedicated before session    Home Living Family/patient expects to be discharged to:: Private residence Living Arrangements: Alone Available Help at Discharge:  (boy friend available ) Type of Home: House Home Access: Stairs to enter   Home Layout: One level Home Equipment: Environmental consultant - 4 wheels;Walker - standard;Cane - single point;Shower seat      Prior Function Level of Independence: Independent with assistive device(s)      Comments: single point cane use PRN   PT Goals (current goals can now be found in the care plan section) Acute Rehab PT Goals Patient Stated Goal: Return home PT  Goal Formulation: With patient Time For Goal Achievement: 10/06/15 Potential to Achieve Goals: Good Progress towards PT goals: Progressing toward goals    Frequency  BID    PT Plan Current plan remains appropriate    Co-evaluation             End of Session Equipment Utilized During Treatment: Gait belt Activity Tolerance: Patient tolerated treatment well Patient left: in chair;with call bell/phone within reach;with chair alarm set     Time: [id number] PT Time Calculation (min) (ACUTE  ONLY): 33 min  Charges:  $Gait Training: 8-22 mins $Therapeutic Exercise: 8-22 mins                    G Codes:      Keitra Carusone 10-22-2015, 12:08 PM Greggory Stallion, PT, DPT [phone]

## 2015-09-23 NOTE — Clinical Social Work Note (Signed)
Clinical Social Worker consulted for [employer]. PT recommended HHPT. CSW discussed pt with RNCM. RNCM following for discharging planning. CSW is signing off as no further needs identified.   Darden Dates, MSW, LCSW Clinical Social Worker  [phone]

## 2015-09-24 LAB — CBC
HEMATOCRIT: 33.8 % — AB (ref 35.0–47.0)
HEMOGLOBIN: 10.9 g/dL — AB (ref 12.0–16.0)
MCH: 28.2 pg (ref 26.0–34.0)
MCHC: 32.1 g/dL (ref 32.0–36.0)
MCV: 87.9 fL (ref 80.0–100.0)
Platelets: 191 10*3/uL (ref 150–440)
RBC: 3.85 MIL/uL (ref 3.80–5.20)
RDW: 13.8 % (ref 11.5–14.5)
WBC: 11.4 10*3/uL — ABNORMAL HIGH (ref 3.6–11.0)

## 2015-09-24 LAB — BASIC METABOLIC PANEL
Anion gap: 5 (ref 5–15)
BUN: 13 mg/dL (ref 6–20)
CHLORIDE: 101 mmol/L (ref 101–111)
CO2: 30 mmol/L (ref 22–32)
CREATININE: 0.59 mg/dL (ref 0.44–1.00)
Calcium: 8.5 mg/dL — ABNORMAL LOW (ref 8.9–10.3)
GFR calc non Af Amer: >60 mL/min (ref >60)
GLUCOSE: 147 mg/dL — AB (ref 65–99)
Potassium: 4.2 mmol/L (ref 3.5–5.1)
Sodium: 136 mmol/L (ref 135–145)

## 2015-09-24 MED ORDER — ENOXAPARIN SODIUM 40 MG/0.4ML ~~LOC~~ SOLN: 40.0000 mg | SUBCUTANEOUS | Status: DC

## 2015-09-24 MED ORDER — OXYCODONE HCL 5 MG PO TABS: 5.0000 mg | ORAL_TABLET | ORAL | Status: DC | PRN

## 2015-09-24 MED ORDER — TRAMADOL HCL 50 MG PO TABS
50.0000 mg | ORAL_TABLET | ORAL | Status: DC | PRN
  Administered 2015-09-24: 50 mg via ORAL
  Administered 2015-09-24 – 2015-09-25 (×2): 100 mg via ORAL
  Filled 2015-09-24 (×2): qty 2
  Filled 2015-09-24: qty 1

## 2015-09-24 MED ORDER — ENOXAPARIN SODIUM 40 MG/0.4ML ~~LOC~~ SOLN
40.0000 mg | Freq: Two times a day (BID) | SUBCUTANEOUS | Status: DC
  Administered 2015-09-24 – 2015-09-25 (×2): 40 mg via SUBCUTANEOUS
  Filled 2015-09-24 (×2): qty 0.4

## 2015-09-24 MED ORDER — OXYCODONE HCL ER 15 MG PO T12A
15.0000 mg | EXTENDED_RELEASE_TABLET | Freq: Two times a day (BID) | ORAL | Status: DC
  Administered 2015-09-24 – 2015-09-25 (×3): 15 mg via ORAL
  Filled 2015-09-24 (×3): qty 1

## 2015-09-24 MED ORDER — MORPHINE SULFATE (PF) 2 MG/ML IV SOLN
2.0000 mg | Freq: Once | INTRAVENOUS | Status: AC
  Administered 2015-09-24: 2 mg via INTRAVENOUS
  Filled 2015-09-24: qty 1

## 2015-09-24 MED ORDER — LACTULOSE 10 GM/15ML PO SOLN
10.0000 g | Freq: Two times a day (BID) | ORAL | Status: DC | PRN
  Administered 2015-09-24: 10 g via ORAL
  Filled 2015-09-24: qty 30

## 2015-09-24 MED ORDER — OXYCODONE HCL ER 15 MG PO T12A: 15.0000 mg | EXTENDED_RELEASE_TABLET | Freq: Two times a day (BID) | ORAL | Status: DC

## 2015-09-24 MED ORDER — TRAMADOL HCL 50 MG PO TABS: 50.0000 mg | ORAL_TABLET | ORAL | Status: DC | PRN

## 2015-09-24 NOTE — Progress Notes (Signed)
Patient much more comfortable today. She is afebrile. Hemoglobin 10.9. Patient progressing slowly with physical therapy. She is slated to be discharged home tomorrow to be followed by home health physical therapy. She is to follow up in the Orthopedic Department at [facility] about 10 days post discharge. She will be using a walker weightbearing to tolerance. She is to keep the dressing dry. She is going to take one 325 mg Bufferin tablet twice a day for about 2 more weeks. She has some calf compression devices that she is going to be using at home. She will probably be discharged home on oxycodone.

## 2015-09-24 NOTE — Progress Notes (Signed)
Subjective: 2 Days Post-Op Procedure(s) (LRB): TOTAL KNEE ARTHROPLASTY (Right) Patient reports pain as moderate.   Patient is well, and has had no acute complaints or problems We will start therapy today.  Plan is to go Home after hospital stay. no nausea and no vomiting Patient denies any chest pains or shortness of breath. Having a lot of pain. Taking oxycodone but not the tramadol  Objective: Vital signs in last 24 hours: Temp:  [98.2 F (36.8 C)-99.4 F (37.4 C)] 98.5 F (36.9 C) (12/30 0420) Pulse Rate:  [104-109] 104 (12/30 0420) Resp:  [16-20] 20 (12/30 0420) BP: (117-163)/(68-87) 159/77 mmHg (12/30 0420) SpO2:  [92 %-99 %] 97 % (12/30 0420) TENS unit in place Heels are non tender and elevated off the bed using rolled towels Intake/Output from previous day: 12/29 0701 - 12/30 0700 In: 2358 [P.O.:640; I.V.:1718] Out: 950 [Urine:950] Intake/Output this shift:     Recent Labs  09/22/15 1411 09/24/15 0602  HGB 12.7 10.9*    Recent Labs  09/22/15 1411 09/24/15 0602  WBC 11.1* 11.4*  RBC 4.52 3.85  HCT 39.9 33.8*  PLT 218 191    Recent Labs  09/22/15 1411 09/24/15 0602  NA  --  136  K  --  4.2  CL  --  101  CO2  --  30  BUN  --  13  CREATININE 0.58 0.59  GLUCOSE  --  147*  CALCIUM  --  8.5*   No results for input(s): LABPT, INR in the last 72 hours.  EXAM General - Patient is Alert, Appropriate and Oriented Extremity - Neurologically intact Neurovascular intact Sensation intact distally Intact pulses distally Dorsiflexion/Plantar flexion intact Dressing - dressing C/D/I Motor Function - intact, moving foot and toes well on exam.    Past Medical History  Diagnosis Date  . Diverticulosis of colon (without mention of hemorrhage)   . Gout, unspecified   . Other abnormal glucose   . Other and unspecified hyperlipidemia   . Unspecified essential hypertension   . Irritable bowel syndrome   . Obesity, unspecified   . Other psoriasis   .  Rosacea   . Heart murmur     no prophylaxis--? valve  . Plantar fasciitis   . History of shingles   . TMJ (temporomandibular joint disorder)   . AC (acromioclavicular) joint bone spurs     feet  . Complication of anesthesia   . PONV (postoperative nausea and vomiting)     nausea only with foot surgery in Jan. 2016, otherwise no nausea  . Anxiety   . History of hiatal hernia   . Hypothyroidism     per pt, was dx in 1978, previosly on Synthroid, but has not taken it in years  . Headache   . Migraine headache   . Migraine-cluster headache syndrome   . Osteoarthrosis, unspecified whether generalized or localized, unspecified site     knees, cervical spine  . Psoriatic arthritis mutilans (HCC)     methotrexate  . Epicondylitis, lateral     right  . Shingles   . Stress fracture of right foot     Assessment/Plan: 2 Days Post-Op Procedure(s) (LRB): TOTAL KNEE ARTHROPLASTY (Right) Active Problems:   S/P total knee replacement  Estimated body mass index is 45.77 kg/(m^2) as calculated from the following:   Height as of this encounter: 5\' 4"  (1.626 m).   Weight as of this encounter: 121 kg (266 lb 12.1 oz). Advance diet Up with therapy D/C IV  fluids Plan for discharge tomorrow Discharge home with home health  Labs: were reviewed DVT Prophylaxis - Lovenox, Foot Pumps and TED hose Weight-Bearing as tolerated to right leg D/C O2 and Pulse OX and try on Room Air Begin working on a bowel movement Please alternate oxycodone and tramadol  Jacobi Ryant R. Lomas Dubois 09/24/2015, 7:16 AM

## 2015-09-24 NOTE — Discharge Instructions (Signed)

## 2015-09-24 NOTE — Progress Notes (Signed)
Physical Therapy Treatment Patient Details Name: Michelle Jordan MRN: [id number] DOB: Aug 14, 1956 Today's Date: 09/24/2015    History of Present Illness Patient is a 59 y/o female that presents for R TKR on 09/22/2015.    PT Comments    Pt is making good progress towards goals with improved ambulation this date using BRW. Pt demonstrates improved reciprocal gait pattern, however still struggles with B UE fatigue. Good endurance and motivation with there-ex. She is making good progress towards independence with bed mobility.  Follow Up Recommendations  Supervision for mobility/OOB;Home health PT     Equipment Recommendations       Recommendations for Other Services       Precautions / Restrictions Precautions Precautions: Knee;Fall Precaution Booklet Issued: Yes (comment) Restrictions Weight Bearing Restrictions: No RLE Weight Bearing: Weight bearing as tolerated    Mobility  Bed Mobility Overal bed mobility: Needs Assistance Bed Mobility: Sit to Supine     Supine to sit: Min guard Sit to supine: Min guard   General bed mobility comments: Slight assist for bringing up R LE to bed. Safe technique performed  Transfers Overall transfer level: Needs assistance Equipment used: Rolling walker (2 wheeled) Transfers: Sit to/from Stand Sit to Stand: Min guard         General transfer comment: cues given for pushing from seated surface. Once standing, pt able to stand with supervision  Ambulation/Gait Ambulation/Gait assistance: Min guard Ambulation Distance (Feet): 160 Feet Assistive device: Rolling walker (2 wheeled) Gait Pattern/deviations: Step-to pattern     General Gait Details: increased speed noted this session with improved reciprocal gait pattern. Pt complains of increased B UE fatigue during ambulation.   Stairs            Wheelchair Mobility    Modified Rankin (Stroke Patients Only)       Balance                                     Cognition Arousal/Alertness: Awake/alert Behavior During Therapy: WFL for tasks assessed/performed Overall Cognitive Status: Within Functional Limits for tasks assessed                      Exercises Total Joint Exercises Goniometric ROM: R kneee AROM: 2-76 degrees Other Exercises Other Exercises: supine ther-ex performed x 15 reps on R LE including ankle pumps, quad sets, SLRs, hip abd/add, and SAQ. All ther-ex performed with min assist and cues for breathing as pt tends to hold breath. Other Exercises: Pt ambulated to [facility], needing cga for set up and direction. Safe technique performed. Pt also to perform self hygiene.    General Comments        Pertinent Vitals/Pain Pain Assessment: 0-10 Pain Score: 6  Pain Location: R knee Pain Descriptors / Indicators: Operative site guarding Pain Intervention(s): Limited activity within patient's tolerance;Patient requesting pain meds-RN notified    Home Living                      Prior Function            PT Goals (current goals can now be found in the care plan section) Acute Rehab PT Goals Patient Stated Goal: Return home PT Goal Formulation: With patient Time For Goal Achievement: 10/06/15 Potential to Achieve Goals: Good Progress towards PT goals: Progressing toward goals    Frequency  BID  PT Plan Current plan remains appropriate    Co-evaluation             End of Session Equipment Utilized During Treatment: Gait belt Activity Tolerance: Patient tolerated treatment well Patient left: in bed;with bed alarm set     Time: 1331-1410 PT Time Calculation (min) (ACUTE ONLY): 39 min  Charges:  $Gait Training: 8-22 mins $Therapeutic Exercise: 8-22 mins $Therapeutic Activity: 8-22 mins                    G Codes:      Verdis Bassette 10-21-2015, 2:51 PM  Greggory Stallion, PT, DPT [phone]

## 2015-09-24 NOTE — Progress Notes (Signed)
Enoxaparin adjusted to 40 mg SQ BID for CrCl >30 ml/min and BMI >40. Body mass index is 45.77 kg/(m^2).  Larene Beach, PharmD

## 2015-09-24 NOTE — Progress Notes (Signed)
Per patient request to call MD for pain control.  Michelle Jordan aware of pain issue new order for oxycontin BID and morphine 2mg  x1

## 2015-09-24 NOTE — Care Management Note (Signed)
Case Management Note  Patient Details  Name: Michelle Jordan MRN: [id number] Date of Birth: April 07, 1956  Subjective/Objective:    A bariatric walker has been ordered from [place] to deliver walker to Mrs [facility] room today. Awaiting call back from [place] at [place] about home health PT. Stanton Kidney is checking on the availability of [place] home health providers next week.                 Action/Plan:   Expected Discharge Date:  09/25/15               Expected Discharge Plan:     In-House Referral:     Discharge planning Services  CM Consult  Post Acute Care Choice:  Durable Medical Equipment, Home Health Choice offered to:  Patient  DME Arranged:  Walker rolling (bariatric) DME Agency:  Walker:    [facility] Agency:     Status of Service:  In process, will continue to follow  Medicare Important Message Given:    Date Medicare IM Given:    Medicare IM give by:    Date Additional Medicare IM Given:    Additional Medicare Important Message give by:     If discussed at [place] of Stay Meetings, dates discussed:    Additional Comments:  Ismael Treptow A, RN 09/24/2015, 11:52 AM

## 2015-09-24 NOTE — Discharge Summary (Signed)
Physician Discharge Summary  Patient ID: Michelle Jordan MRN: [id number] DOB/AGE: 12/18/1955 59 y.o.  Admit date: 09/22/2015 Discharge date: 12/31//2017  Admission Diagnoses:  PRIMARY OSTEOARTHRITIS   Discharge Diagnoses: Patient Active Problem List   Diagnosis Date Noted  . S/P total knee replacement 09/22/2015  . Preoperative examination 09/28/2014  . Foot fracture, left 08/18/2014  . Migraine 08/18/2014  . Lipoma 07/28/2011  . Anxiety 07/28/2011  . GOUT, UNSPECIFIED 03/09/2010  . Pain in limb 07/13/2008  . Hyperlipidemia 02/12/2007  . OBESITY NOS 02/12/2007  . IRITIS 02/12/2007  . Essential hypertension 02/12/2007  . DIVERTICULOSIS 02/12/2007  . IBS 02/12/2007  . ROSACEA 02/12/2007  . PSORIASIS NEC 02/12/2007  . OSTEOARTHROSIS NOS, UNSPECIFIED SITE 02/12/2007    Past Medical History  Diagnosis Date  . Diverticulosis of colon (without mention of hemorrhage)   . Gout, unspecified   . Other abnormal glucose   . Other and unspecified hyperlipidemia   . Unspecified essential hypertension   . Irritable bowel syndrome   . Obesity, unspecified   . Other psoriasis   . Rosacea   . Heart murmur     no prophylaxis--? valve  . Plantar fasciitis   . History of shingles   . TMJ (temporomandibular joint disorder)   . AC (acromioclavicular) joint bone spurs     feet  . Complication of anesthesia   . PONV (postoperative nausea and vomiting)     nausea only with foot surgery in Jan. 2016, otherwise no nausea  . Anxiety   . History of hiatal hernia   . Hypothyroidism     per pt, was dx in 1978, previosly on Synthroid, but has not taken it in years  . Headache   . Migraine headache   . Migraine-cluster headache syndrome   . Osteoarthrosis, unspecified whether generalized or localized, unspecified site     knees, cervical spine  . Psoriatic arthritis mutilans (HCC)     methotrexate  . Epicondylitis, lateral     right  . Shingles   . Stress fracture of right foot       Transfusion: No transfusion during this admission   Consultants (if any):  Case management for home health assistance  Discharged Condition: Improved  Hospital Course: Michelle Jordan is an 59 y.o. female who was admitted 09/22/2015 with a diagnosis of degenerative arthrosis right knee and went to the operating room on 09/22/2015 and underwent the above named procedures.    Surgeries:Procedure(s): TOTAL KNEE ARTHROPLASTY on 09/22/2015  PRE-OPERATIVE DIAGNOSIS: Degenerative arthrosis of the right knee  POST-OPERATIVE DIAGNOSIS: Degenerative arthrosis of the right knee  PROCEDURE: Right total knee arthroplasty  SURGEON: Dr.Harold Leeanne Mannan. M.D.  ANESTHESIA: Spinal  ESTIMATED BLOOD LOSS: About 100 mL  TOURNIQUET TIME: 139 minutes   IMPLANTS UTILIZED: Triathlon #4 posterior stabilized femoral component, #4 tibial component, #4 9 mm thick tibial bearing insert, 10 mm thick asymmetric patella(A35)  INDICATIONS FOR SURGERY: Michelle Jordan is a 59 y.o. year old female with a long history of progressive knee pain. X-rays demonstrated severe degenerative changes in tricompartmental fashion. The patient had not seen any significant improvement despite conservative nonsurgical intervention. After discussion of the risks and benefits of surgical intervention, the patient expressed understanding of the risks benefits and agreed with plans for total knee arthroplasty.   The risks, benefits, and alternatives were discussed at length including but not limited to the risks of infection, bleeding, nerve injury, stiffness, blood clots, the need for revision surgery, cardiopulmonary complications, among others,  and they were willing to proceed. Patient tolerated the surgery well. No complications .Patient was taken to PACU where she was stabilized and then transferred to the orthopedic floor.  Patient started on Lovenox 30 q 12 hrs. Foot pumps applied bilaterally at 80 mm hg. Heels  elevated off bed with rolled towels. No evidence of DVT. Calves non tender. Negative Homan. Physical therapy started on day #1 for gait training and transfer with OT starting on  day #1 for ADL and assisted devices. Patient has done well with therapy. Ambulated greater than 200 feet upon being discharged. Was able to go up and down 4 step independently without assistance.   Patient's IV , foley and hemovac was d/c on day #2.   She was given perioperative antibiotics:  Anti-infectives    Start     Dose/Rate Route Frequency Ordered Stop   09/22/15 1415  ceFAZolin (ANCEF) IVPB 1 g/50 mL premix     1 g 100 mL/hr over 30 Minutes Intravenous 3 times per day 09/22/15 1353 09/23/15 0541   09/22/15 0615  ceFAZolin (ANCEF) IVPB 1 g/50 mL premix  Status:  Discontinued     1 g 100 mL/hr over 30 Minutes Intravenous  Once 09/22/15 0603 09/22/15 1353   09/22/15 0555  ceFAZolin (ANCEF) 1-5 GM-% IVPB    Comments:  Slemenda, Debbie: cabinet override      09/22/15 0555 09/22/15 0803    .  She was fitted with AV 1 compression foot pump devices bilaterally, early ambulation, instructed on heel pump and TED stocking for DVT prophylaxis.  She benefited maximally from the hospital stay and there were no complications.    Recent vital signs:  Filed Vitals:   09/23/15 1931 09/24/15 0420  BP: 156/87 159/77  Pulse: 109 104  Temp: 99.4 F (37.4 C) 98.5 F (36.9 C)  Resp: 18 20    Recent laboratory studies:  Lab Results  Component Value Date   HGB 10.9* 09/24/2015   HGB 12.7 09/22/2015   Lab Results  Component Value Date   WBC 11.4* 09/24/2015   PLT 191 09/24/2015   Lab Results  Component Value Date   INR 0.98 09/08/2015   Lab Results  Component Value Date   NA 136 09/24/2015   K 4.2 09/24/2015   CL 101 09/24/2015   CO2 30 09/24/2015   BUN 13 09/24/2015   CREATININE 0.59 09/24/2015   GLUCOSE 147* 09/24/2015    Discharge Medications:     Medication List    STOP taking these  medications        methotrexate 2.5 MG tablet  Commonly known as:  RHEUMATREX      TAKE these medications        ALPRAZolam 0.5 MG tablet  Commonly known as:  XANAX  TAKE 1 TABLET BY MOUTH AT BEDTIME AS NEEDED FOR SLEEP     b complex vitamins capsule  Take 1 capsule by mouth at bedtime.     Biotin 5000 MCG Caps  Take 1 capsule by mouth at bedtime.     calcium carbonate 1500 (600 Ca) MG Tabs tablet  Commonly known as:  OSCAL  Take 600 mg of elemental calcium by mouth at bedtime.     clidinium-chlordiazePOXIDE 5-2.5 MG capsule  Commonly known as:  LIBRAX  Take 1 capsule by mouth 2 (two) times daily as needed.     clobetasol cream 0.05 %  Commonly known as:  TEMOVATE  Apply 1 application topically 2 (two) times daily as  needed.     doxycycline 50 MG capsule  Commonly known as:  VIBRAMYCIN  Take 1 capsule by mouth  twice daily as needed as  directed     enoxaparin 40 MG/0.4ML injection  Commonly known as:  LOVENOX  Inject 0.4 mLs (40 mg total) into the skin daily.     folic acid 1 MG tablet  Commonly known as:  FOLVITE  Take 1 mg by mouth at bedtime.     multivitamin capsule  Take 1 capsule by mouth at bedtime.     oxyCODONE 5 MG immediate release tablet  Commonly known as:  Oxy IR/ROXICODONE  Take 1-2 tablets (5-10 mg total) by mouth every 3 (three) hours as needed for breakthrough pain.     oxyCODONE 15 mg 12 hr tablet  Commonly known as:  OXYCONTIN  Take 1 tablet (15 mg total) by mouth every 12 (twelve) hours.     promethazine 25 MG tablet  Commonly known as:  PHENERGAN  Take 1 tablet (25 mg total) by mouth every 6 (six) hours as needed for nausea (with headache).     traMADol 50 MG tablet  Commonly known as:  ULTRAM  Take 50 mg by mouth 2 (two) times daily. Maximum dose= 8 tablets per day     traMADol 50 MG tablet  Commonly known as:  ULTRAM  Take 1-2 tablets (50-100 mg total) by mouth every 4 (four) hours as needed for moderate pain.     vitamin B-12  1000 MCG tablet  Commonly known as:  CYANOCOBALAMIN  Take 1,000 mcg by mouth at bedtime.     Vitamin D3 5000 units Caps  Take 1 capsule by mouth at bedtime.        Diagnostic Studies: Dg Knee Right Port  09/22/2015  CLINICAL DATA:  Status post right knee arthroplasty. EXAM: PORTABLE RIGHT KNEE - 1-2 VIEW COMPARISON:  MRI of the right knee on 03/06/2015 FINDINGS: Frontal lateral projection show anatomic alignment of a total knee arthroplasty on the right. No fracture or abnormal lucency identified. Soft tissues are grossly unremarkable. IMPRESSION: Normal alignment of right knee arthroplasty. Electronically Signed   By: Aletta Edouard M.D.   On: 09/22/2015 13:34    Disposition: 01-Home or Self Care      Discharge Instructions    Diet - low sodium heart healthy    Complete by:  As directed      Increase activity slowly    Complete by:  As directed            Follow-up Information    Follow up with Vilinda Flake, MD In 2 weeks.   Specialty:  Orthopedic Surgery   Why:  Patient should already have an appointment scheduled   Contact information:   [address] [place] [address] [phone]        Signed: Watt Climes 09/24/2015, 7:26 AM

## 2015-09-24 NOTE — Progress Notes (Signed)
Physical Therapy Treatment Patient Details Name: Michelle Jordan MRN: [id number] DOB: 1956/06/25 Today's Date: 09/24/2015    History of Present Illness Patient is a 59 y/o female that presents for R TKR on 09/22/2015.    PT Comments    Pt is making good progress towards goals with increased ambulation distance this date. Good improvement with ROM and there-ex and pt is very motivated to work with therapy. Pain still appears to limit patient with her reporting 8/10 with rest and 7/10 with movement. Needs to continue working on gait sequencing and fluid movement.  Follow Up Recommendations  Supervision for mobility/OOB;Home health PT     Equipment Recommendations       Recommendations for Other Services       Precautions / Restrictions Precautions Precautions: Knee;Fall Precaution Booklet Issued: Yes (comment) Restrictions Weight Bearing Restrictions: No RLE Weight Bearing: Weight bearing as tolerated    Mobility  Bed Mobility Overal bed mobility: Needs Assistance Bed Mobility: Supine to Sit     Supine to sit: Min guard     General bed mobility comments: Safe technique performed with level bed.   Transfers Overall transfer level: Needs assistance Equipment used: Rolling walker (2 wheeled) Transfers: Sit to/from Stand Sit to Stand: Min guard         General transfer comment: cues given for pushing from seated surface. Once standing, pt able to stand with supervision  Ambulation/Gait Ambulation/Gait assistance: Min guard Ambulation Distance (Feet): 110 Feet Assistive device: Rolling walker (2 wheeled) Gait Pattern/deviations: Step-to pattern     General Gait Details: ambulates with short step length with cues for reciprocal gait pattern. Slow gait speed noted   Stairs            Wheelchair Mobility    Modified Rankin (Stroke Patients Only)       Balance                                    Cognition Arousal/Alertness:  Awake/alert Behavior During Therapy: WFL for tasks assessed/performed Overall Cognitive Status: Within Functional Limits for tasks assessed                      Exercises Total Joint Exercises Goniometric ROM: R kneee AROM: 2-76 degrees Other Exercises Other Exercises: supine ther-ex performed x 15 reps on R LE including ankle pumps, quad sets, SLRs, hip abd/add and knee flexion stretches. All ther-ex performed with cga/min assist for completion Other Exercises: Pt ambulated to [facility], needing cga for set up and direction. Safe technique performed. Pt also to perform self hygiene.    General Comments        Pertinent Vitals/Pain Pain Assessment: 0-10 Pain Score: 8  Pain Location: R knee Pain Descriptors / Indicators: Operative site guarding Pain Intervention(s): Limited activity within patient's tolerance;Premedicated before session    Home Living                      Prior Function            PT Goals (current goals can now be found in the care plan section) Acute Rehab PT Goals Patient Stated Goal: Return home PT Goal Formulation: With patient Time For Goal Achievement: 10/06/15 Potential to Achieve Goals: Good Progress towards PT goals: Progressing toward goals    Frequency  BID    PT Plan Current plan remains appropriate  Co-evaluation             End of Session Equipment Utilized During Treatment: Gait belt Activity Tolerance: Patient tolerated treatment well Patient left: in chair;with chair alarm set     Time: [phone] PT Time Calculation (min) (ACUTE ONLY): 30 min  Charges:  $Gait Training: 8-22 mins $Therapeutic Exercise: 8-22 mins                    G Codes:      Michelle Jordan 22-Oct-2015, 12:45 PM Michelle Jordan, PT, DPT [phone]

## 2015-09-24 NOTE — Care Management Note (Signed)
Case Management Note  Patient Details  Name: Michelle Jordan MRN: [id number] Date of Birth: 11-26-1955  Subjective/Objective:      Bariatric rolling walker request called to Will Ouida Sills at [facility].               Action/Plan:   Expected Discharge Date:  09/25/15               Expected Discharge Plan:     In-House Referral:     Discharge planning Services  CM Consult  Post Acute Care Choice:  Durable Medical Equipment, Home Health Choice offered to:  Patient  DME Arranged:  Walker rolling (bariatric) DME Agency:  [place]:    [facility] Agency:     Status of Service:  In process, will continue to follow  Medicare Important Message Given:    Date Medicare IM Given:    Medicare IM give by:    Date Additional Medicare IM Given:    Additional Medicare Important Message give by:     If discussed at [place] of Stay Meetings, dates discussed:    Additional Comments:  [name], RN 09/24/2015, 9:53 AM

## 2015-09-24 NOTE — Progress Notes (Signed)
Occupational Therapy Treatment Patient Details Name: Michelle Jordan MRN: [id number] DOB: 25-Oct-1955 Today's Date: 09/24/2015    History of present illness Patient is a 59 y/o female that presents for R TKR on 09/22/2015.   OT comments  Patient declined actually getting dressed but willing to practice techniques for lower body dressing   Follow Up Recommendations   (Home with home health PT. No further OT )    Equipment Recommendations       Recommendations for Other Services      Precautions / Restrictions Precautions Precautions: Knee;Fall Precaution Booklet Issued: Yes (comment) (per PT) Restrictions Weight Bearing Restrictions: No RLE Weight Bearing: Weight bearing as tolerated       Mobility Bed Mobility   Transfers            Balance                                   ADL                                         General ADL Comments: Patient declined acutally getting dressed but willing to practice. Patient practiced techniques for lower body dressing with one cue for technique using hip kit. She only needed set up and hip kit to Donned/doffed socks and pants to knees.      Vision                     Perception     Praxis      Cognition   Behavior During Therapy: WFL for tasks assessed/performed Overall Cognitive Status: Within Functional Limits for tasks assessed                       Extremity/Trunk Assessment               Exercises   Shoulder Instructions       General Comments      Pertinent Vitals/ Pain          Home Living                                          Prior Functioning/Environment              Frequency       Progress Toward Goals  OT Goals(current goals can now be found in the care plan section)     Acute Rehab OT Goals Patient Stated Goal: Return home  Plan      Co-evaluation                 End of Session Equipment  Utilized During Treatment:  (hip kit)   Activity Tolerance     Patient Left in bed;with call bell/phone within reach;with bed alarm set;with family/visitor present   Nurse Communication          Time: 1455-1510 OT Time Calculation (min): 15 min  Charges: OT General Charges $OT Visit: 1 Procedure OT Treatments $Self Care/Home Management : 8-22 mins Sharon Mt, MS/OTR/L  Sharon Mt 09/24/2015, 3:15 PM

## 2015-09-25 MED ORDER — TRAMADOL HCL 50 MG PO TABS: 50.0000 mg | ORAL_TABLET | ORAL | Status: DC | PRN

## 2015-09-25 MED ORDER — ENOXAPARIN SODIUM 40 MG/0.4ML ~~LOC~~ SOLN: 40.0000 mg | SUBCUTANEOUS | Status: DC

## 2015-09-25 NOTE — Progress Notes (Signed)
Subjective: 3 Days Post-Op Procedure(s) (LRB): TOTAL KNEE ARTHROPLASTY (Right) Patient reports pain as mild.  Much better than it was yesterday. Patient is well, and has had no acute complaints or problems Continue with physical therapy today.  Plan is to go Home after hospital stay. no nausea and no vomiting Patient denies any chest pains or shortness of breath. Objective: Vital signs in last 24 hours: Temp:  [97.9 F (36.6 C)-99.1 F (37.3 C)] 98.2 F (36.8 C) (12/31 0834) Pulse Rate:  [105-118] 118 (12/31 0834) Resp:  [18] 18 (12/31 0834) BP: (134-182)/(65-79) 141/65 mmHg (12/31 0834) SpO2:  [98 %-100 %] 100 % (12/31 0834) well approximated incision Heels are non tender and elevated off the bed using rolled towels Intake/Output from previous day: 12/30 0701 - 12/31 0700 In: 1860 [P.O.:1860] Out: 1350 [id number] Intake/Output this shift:     Recent Labs  09/22/15 1411 09/24/15 0602  HGB 12.7 10.9*    Recent Labs  09/22/15 1411 09/24/15 0602  WBC 11.1* 11.4*  RBC 4.52 3.85  HCT 39.9 33.8*  PLT 218 191    Recent Labs  09/22/15 1411 09/24/15 0602  NA  --  136  K  --  4.2  CL  --  101  CO2  --  30  BUN  --  13  CREATININE 0.58 0.59  GLUCOSE  --  147*  CALCIUM  --  8.5*   No results for input(s): LABPT, INR in the last 72 hours.  EXAM General - Patient is Alert, Appropriate and Oriented Extremity - Neurologically intact Neurovascular intact Sensation intact distally Intact pulses distally Dorsiflexion/Plantar flexion intact Dressing - dressing C/D/I Motor Function - intact, moving foot and toes well on exam.    Past Medical History  Diagnosis Date  . Diverticulosis of colon (without mention of hemorrhage)   . Gout, unspecified   . Other abnormal glucose   . Other and unspecified hyperlipidemia   . Unspecified essential hypertension   . Irritable bowel syndrome   . Obesity, unspecified   . Other psoriasis   . Rosacea   . Heart murmur      no prophylaxis--? valve  . Plantar fasciitis   . History of shingles   . TMJ (temporomandibular joint disorder)   . AC (acromioclavicular) joint bone spurs     feet  . Complication of anesthesia   . PONV (postoperative nausea and vomiting)     nausea only with foot surgery in Jan. 2016, otherwise no nausea  . Anxiety   . History of hiatal hernia   . Hypothyroidism     per pt, was dx in 1978, previosly on Synthroid, but has not taken it in years  . Headache   . Migraine headache   . Migraine-cluster headache syndrome   . Osteoarthrosis, unspecified whether generalized or localized, unspecified site     knees, cervical spine  . Psoriatic arthritis mutilans (HCC)     methotrexate  . Epicondylitis, lateral     right  . Shingles   . Stress fracture of right foot     Assessment/Plan: 3 Days Post-Op Procedure(s) (LRB): TOTAL KNEE ARTHROPLASTY (Right) Active Problems:   S/P total knee replacement  Estimated body mass index is 45.77 kg/(m^2) as calculated from the following:   Height as of this encounter: 5\' 4"  (1.626 m).   Weight as of this encounter: 121 kg (266 lb 12.1 oz). Up with therapy Discharge home with home health today after physical therapy  Labs: Reviewed DVT  Prophylaxis - Lovenox, Foot Pumps and TED hose Weight-Bearing as tolerated to left leg    Mida Cory R. [place] 09/25/2015, 8:38 AM

## 2015-09-25 NOTE — Progress Notes (Signed)
Order from MD to discharge patient to home today with home health, discharge instructions given per MD order, home and new medications reviewed, RX.slips given, patient verbalized understanding of discharge instructions given.  Discharge via wheelchair with nurse tech.

## 2015-09-25 NOTE — Progress Notes (Signed)
Pt conversing with nurse that she can not afford lovenox  injections that Dr Jefm Bryant told her she would be taking one adult aspirin daily , this was called to Vance Peper PA due to Dr Marry Guan being in surgery , and Vance Peper said  Okay to discontinue order for lovenox and inform pt to take aspirin as instructed by Dr Jefm Bryant

## 2015-09-25 NOTE — Progress Notes (Signed)
Discharge instructions corrected pt informed of change from lovenox to one adult aspirin 325 every day till return visit to Dr Jefm Bryant

## 2015-09-25 NOTE — Progress Notes (Signed)
Physical Therapy Treatment Patient Details Name: Michelle Jordan MRN: [id number] DOB: July 09, 1956 Today's Date: 09/25/2015    History of Present Illness Patient is a 59 y/o female that presents for R TKR on 09/22/2015.    PT Comments    Pt tolerating treatment session well, motivated and able to complete entire PT sesssion as planned. Pt continues to make progress toward goals as evidenced by improved ambulation distance, improved gait quality, safer use of BRW, and less fatigue in BUE during gait. Pt's greatest limitation continues to be focal R limb weakness, limiting AROM during mobility, which continues to limit ability to perform all mobility tasks at baseline function. Patient presenting with impairment of strength, pain, range of motion, balance, and activity tolerance, limiting ability to perform ADL and mobility tasks at  baseline level of function. Patient will benefit from skilled intervention to address the above impairments and limitations, in order to restore to prior level of function, improve patient safety upon discharge, and to decrease caregiver burden.     Follow Up Recommendations  Supervision for mobility/OOB;Home health PT     Equipment Recommendations  None recommended by PT    Recommendations for Other Services       Precautions / Restrictions Precautions Precautions: Fall;Knee (no falls score available at this time. ) Restrictions Weight Bearing Restrictions: Yes RLE Weight Bearing: Weight bearing as tolerated    Mobility  Bed Mobility Overal bed mobility: Needs Assistance Bed Mobility: Supine to Sit     Supine to sit: Supervision     General bed mobility comments: Self assist for movement of RLE.   Transfers Overall transfer level: Needs assistance Equipment used:  (BRW) Transfers: Sit to/from Stand Sit to Stand: Supervision         General transfer comment: Performed with ease.  Ambulation/Gait Ambulation/Gait assistance:  Supervision Ambulation Distance (Feet): 260 Feet Assistive device:  (BRW)   Gait velocity: 0.36 m/s Gait velocity interpretation: <1.8 ft/sec, indicative of risk for recurrent falls General Gait Details: Wide based gait, decrease step length on L c VC to correct. Gradually able to use BRW c continuous roll (2pt gait v 3pt gait).    Stairs Stairs:  (Pt declined: she reports only having a partial step at threshold, that she has been navigating with L NWB and RW for the past several weeks. She reports fluency and confidence. )          Wheelchair Mobility    Modified Rankin (Stroke Patients Only)       Balance Overall balance assessment: Modified Independent;No apparent balance deficits (not formally assessed)                                  Cognition Arousal/Alertness: Awake/alert Behavior During Therapy: WFL for tasks assessed/performed Overall Cognitive Status: Within Functional Limits for tasks assessed                      Exercises Total Joint Exercises Long Arc Quad: AAROM;Right;10 reps;Seated Knee Flexion: Right;10 reps;Seated;AAROM Goniometric ROM: R knee AROM: 5-75 degrees    General Comments        Pertinent Vitals/Pain Pain Assessment: 0-10 Pain Score: 5  Pain Location: R knee, distal  Pain Intervention(s): Limited activity within patient's tolerance;Monitored during session;Premedicated before session;Ice applied;Patient requesting pain meds-RN notified    Home Living  Prior Function            PT Goals (current goals can now be found in the care plan section) Acute Rehab PT Goals Patient Stated Goal: Return home PT Goal Formulation: With patient Time For Goal Achievement: 10/06/15 Potential to Achieve Goals: Good Progress towards PT goals: Progressing toward goals    Frequency  BID    PT Plan Current plan remains appropriate    Co-evaluation             End of Session Equipment  Utilized During Treatment: Gait belt Activity Tolerance: Patient tolerated treatment well;Patient limited by fatigue Patient left: in chair;with call bell/phone within reach;with chair alarm set     Time: [id number] PT Time Calculation (min) (ACUTE ONLY): 32 min  Charges:  $Gait Training: 8-22 mins $Therapeutic Activity: 8-22 mins                    G Codes:      Buccola,Allan C 2015-10-11, 9:17 AM  9:19 AM  Etta Grandchild, PT, DPT [place] License # [id number]

## 2015-10-08 ENCOUNTER — Other Ambulatory Visit: Payer: Self-pay

## 2015-10-08 MED ORDER — ALPRAZOLAM 0.5 MG PO TABS: 0.5000 mg | ORAL_TABLET | Freq: Every evening | ORAL | Status: AC | PRN

## 2015-10-08 NOTE — Telephone Encounter (Signed)
Px written for call in   

## 2015-10-08 NOTE — Telephone Encounter (Signed)
Pt left v/m; pt had anxiety attack today and pt only has 2 xanax left; pt request refill to [facility]. Last refilled # 30 on 05/14/15. Last seen 09/28/14 and no future appt scheduled.Please advise.

## 2015-10-11 NOTE — Telephone Encounter (Signed)
Rx called in as prescribed 

## 2016-03-30 ENCOUNTER — Other Ambulatory Visit: Payer: Self-pay | Admitting: Family Medicine

## 2016-03-30 NOTE — Telephone Encounter (Signed)
Left voicemail requesting pt to call and schedule f/u appt with Dr. Glori Bickers before we can refill med

## 2016-03-30 NOTE — Telephone Encounter (Signed)
Last filled on 03/23/15 #180 caps with 1 additional refill, please advise

## 2016-03-30 NOTE — Telephone Encounter (Signed)
Please refill 180 with no ref  F/u with me in the fall unless she has one already planned

## 2016-04-04 NOTE — Telephone Encounter (Signed)
Once she schedules an appt-you can refill it  thanks

## 2016-04-25 ENCOUNTER — Other Ambulatory Visit: Payer: Self-pay | Admitting: Obstetrics and Gynecology

## 2016-04-25 DIAGNOSIS — Z1231 Encounter for screening mammogram for malignant neoplasm of breast: Secondary | ICD-10-CM

## 2016-05-09 ENCOUNTER — Ambulatory Visit: Payer: 59 | Attending: Obstetrics and Gynecology

## 2016-05-10 ENCOUNTER — Ambulatory Visit
Admission: RE | Admit: 2016-05-10 | Discharge: 2016-05-10 | Disposition: A | Discharge status: Home / Self Care | Payer: 59 | Source: Ambulatory Visit | Attending: Obstetrics and Gynecology | Admitting: Obstetrics and Gynecology

## 2016-05-10 ENCOUNTER — Other Ambulatory Visit: Payer: Self-pay | Admitting: Obstetrics and Gynecology

## 2016-05-10 DIAGNOSIS — Z1231 Encounter for screening mammogram for malignant neoplasm of breast: Secondary | ICD-10-CM | POA: Diagnosis not present

## 2016-10-23 ENCOUNTER — Encounter: Payer: 59 | Admitting: Family Medicine

## 2017-04-26 ENCOUNTER — Other Ambulatory Visit: Payer: Self-pay | Admitting: Obstetrics and Gynecology

## 2017-04-26 DIAGNOSIS — Z1231 Encounter for screening mammogram for malignant neoplasm of breast: Secondary | ICD-10-CM

## 2017-05-14 ENCOUNTER — Ambulatory Visit
Admission: RE | Admit: 2017-05-14 | Discharge: 2017-05-14 | Disposition: A | Discharge status: Home / Self Care | Payer: 59 | Source: Ambulatory Visit | Attending: Obstetrics and Gynecology | Admitting: Obstetrics and Gynecology

## 2017-05-14 DIAGNOSIS — Z1231 Encounter for screening mammogram for malignant neoplasm of breast: Secondary | ICD-10-CM | POA: Diagnosis not present

## 2018-01-20 ENCOUNTER — Other Ambulatory Visit: Payer: Self-pay

## 2018-01-20 ENCOUNTER — Ambulatory Visit
Admission: EM | Admit: 2018-01-20 | Discharge: 2018-01-20 | Disposition: A | Discharge status: Home / Self Care | Payer: Managed Care, Other (non HMO) | Attending: Family Medicine | Admitting: Family Medicine

## 2018-01-20 ENCOUNTER — Encounter: Payer: Self-pay | Admitting: Emergency Medicine

## 2018-01-20 DIAGNOSIS — J302 Other seasonal allergic rhinitis: Secondary | ICD-10-CM | POA: Diagnosis not present

## 2018-01-20 DIAGNOSIS — J3489 Other specified disorders of nose and nasal sinuses: Secondary | ICD-10-CM

## 2018-01-20 DIAGNOSIS — J029 Acute pharyngitis, unspecified: Secondary | ICD-10-CM

## 2018-01-20 DIAGNOSIS — H9203 Otalgia, bilateral: Secondary | ICD-10-CM

## 2018-01-20 MED ORDER — HYDROCOD POLST-CPM POLST ER 10-8 MG/5ML PO SUER: 5.0000 mL | Freq: Every evening | ORAL | 0 refills | Status: DC | PRN

## 2018-01-20 MED ORDER — LORATADINE-PSEUDOEPHEDRINE ER 10-240 MG PO TB24: 1.0000 | ORAL_TABLET | Freq: Every day | ORAL | 0 refills | Status: DC

## 2018-01-20 MED ORDER — FLUTICASONE PROPIONATE 50 MCG/ACT NA SUSP: 1.0000 | Freq: Every day | NASAL | 2 refills | Status: DC

## 2018-01-20 NOTE — Discharge Instructions (Addendum)
Please take Flonase, Claritin, pseudoephedrine as prescribed.  Needs nighttime cough medication as needed.  Continue with nasal saline flushes.  If any worsening symptoms, increasing pain, fevers, return to the clinic.

## 2018-01-20 NOTE — ED Triage Notes (Signed)
Patient in today c/o sore throat, right ear "pulling" and starting to have mucous build up in her throat. Patient states she has been sick with this and finished a Zpak 2 weeks ago.

## 2018-01-20 NOTE — ED Provider Notes (Signed)
[facility]    CSN: [id number] Arrival date & time: 01/20/18  1350     History   Chief Complaint Chief Complaint  Patient presents with  . Sore Throat    APPT    HPI Michelle Jordan is a 62 y.o. female presents to the urgent care facility for evaluation of sore throat, bilateral ear pain, runny nose congestion and sneezing.  Symptoms been present for 3 weeks.  She was treated with a Z-Pak upon initial symptom onset, symptoms did not improve.  She has taken 2 doses of Benadryl with mild improvement over the last 2 weeks.  She denies any fevers, productive cough, chest pain or shortness of breath.  She is tolerating p.o. well.  Denies any weakness, body aches, nausea or vomiting.  HPI  Past Medical History:  Diagnosis Date  . AC (acromioclavicular) joint bone spurs    feet  . Anxiety   . Complication of anesthesia   . Diverticulosis of colon (without mention of hemorrhage)   . Epicondylitis, lateral    right  . Gout, unspecified   . Headache   . Heart murmur    no prophylaxis--? valve  . History of hiatal hernia   . History of shingles   . Hypothyroidism    per pt, was dx in 1978, previosly on Synthroid, but has not taken it in years  . Irritable bowel syndrome   . Migraine headache   . Migraine-cluster headache syndrome   . Obesity, unspecified   . Osteoarthrosis, unspecified whether generalized or localized, unspecified site    knees, cervical spine  . Other abnormal glucose   . Other and unspecified hyperlipidemia   . Other psoriasis   . Plantar fasciitis   . PONV (postoperative nausea and vomiting)    nausea only with foot surgery in Jan. 2016, otherwise no nausea  . 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 Rosacea   . Shingles   . Stress fracture of right foot   . TMJ (temporomandibular joint disorder)   . Unspecified essential hypertension     Patient Active Problem List   Diagnosis Date Noted  . S/P total knee replacement  09/22/2015  . Preoperative examination 09/28/2014  . Foot fracture, left 08/18/2014  . Migraine 08/18/2014  . Lipoma 07/28/2011  . Anxiety 07/28/2011  . GOUT, UNSPECIFIED 03/09/2010  . Pain in limb 07/13/2008  . Hyperlipidemia 02/12/2007  . OBESITY NOS 02/12/2007  . IRITIS 02/12/2007  . Essential hypertension 02/12/2007  . DIVERTICULOSIS 02/12/2007  . IBS 02/12/2007  . ROSACEA 02/12/2007  . PSORIASIS NEC 02/12/2007  . OSTEOARTHROSIS NOS, UNSPECIFIED SITE 02/12/2007    Past Surgical History:  Procedure Laterality Date  . ABDOMINAL HYSTERECTOMY    . c-s surgery  2005  . CARPAL TUNNEL RELEASE     bilateral  . CERVICAL DISC SURGERY     C5, C6  . CHOLECYSTECTOMY    . COLONOSCOPY N/A 09/10/2015   Procedure: COLONOSCOPY;  Surgeon: Lollie Sails, MD;  Location: [facility] ENDOSCOPY;  Service: Endoscopy;  Laterality: N/A;  . ESOPHAGOGASTRODUODENOSCOPY     had esophageal stricture and swallowing study  . FOOT FRACTURE SURGERY Left 09/2014   and bunionectomy  . LAPAROSCOPIC SUPRACERVICAL HYSTERECTOMY  11/06   Fibroid tumor and ovarian cyst (one ovary and cervix left)  . NECK SURGERY  2006  . OOPHORECTOMY  2004  . OTHER SURGICAL HISTORY     history of gastric partition for weight loss  . OVARIAN  CYST REMOVAL    . STOMACH SURGERY  years ago   for weight loss--ruptured incision  . TOTAL KNEE ARTHROPLASTY Right 09/22/2015   Procedure: TOTAL KNEE ARTHROPLASTY;  Surgeon: Leanor Kail, MD;  Location: [facility] ORS;  Service: Orthopedics;  Laterality: Right;    OB History   None      Home Medications    Prior to Admission medications   Medication Sig Start Date End Date Taking? Authorizing Provider  doxycycline (VIBRAMYCIN) 50 MG capsule Take 1 capsule by mouth  twice daily as needed as  directed 03/23/15  Yes Tower, Wynelle Fanny, MD  folic acid (FOLVITE) 1 MG tablet Take 1 mg by mouth at bedtime.    Yes [provider]  methotrexate (RHEUMATREX) 2.5 MG tablet Take 6 tablets by  mouth every 7 (seven) days. 08/09/17  Yes [provider]  traMADol (ULTRAM) 50 MG tablet Take 1-2 tablets (50-100 mg total) by mouth every 4 (four) hours as needed for severe pain. 09/25/15  Yes Watt Climes, PA  ALPRAZolam Duanne Moron) 0.5 MG tablet Take 1 tablet (0.5 mg total) by mouth at bedtime as needed. for sleep 10/08/15   Tower, Roque Lias A, MD  b complex vitamins capsule Take 1 capsule by mouth at bedtime.     [provider]  Biotin 5000 MCG CAPS Take 1 capsule by mouth at bedtime.     [provider]  calcium carbonate (OSCAL) 1500 (600 CA) MG TABS tablet Take 600 mg of elemental calcium by mouth at bedtime.    [provider]  chlorpheniramine-HYDROcodone (TUSSIONEX PENNKINETIC ER) 10-8 MG/5ML SUER Take 5 mLs by mouth at bedtime as needed for cough. 01/20/18   Duanne Guess, PA-C  Cholecalciferol (VITAMIN D3) 5000 UNITS CAPS Take 1 capsule by mouth at bedtime.    [provider]  clidinium-chlordiazePOXIDE (LIBRAX) 2.5-5 MG per capsule Take 1 capsule by mouth 2 (two) times daily as needed. 07/28/11   Tower, Wynelle Fanny, MD  clobetasol (TEMOVATE) 0.05 % cream Apply 1 application topically 2 (two) times daily as needed.      [provider]  enoxaparin (LOVENOX) 40 MG/0.4ML injection Inject 0.4 mLs (40 mg total) into the skin daily. 09/24/15   Watt Climes, PA  enoxaparin (LOVENOX) 40 MG/0.4ML injection Inject 0.4 mLs (40 mg total) into the skin daily. 09/25/15   Watt Climes, PA  fluticasone (FLONASE) 50 MCG/ACT nasal spray Place 1 spray into both nostrils daily. 01/20/18   Duanne Guess, PA-C  loratadine-pseudoephedrine (CLARITIN-D 24 HOUR) 10-240 MG 24 hr tablet Take 1 tablet by mouth daily. 01/20/18   Duanne Guess, PA-C  Multiple Vitamin (MULTIVITAMIN) capsule Take 1 capsule by mouth at bedtime.     [provider]  oxyCODONE (OXY IR/ROXICODONE) 5 MG immediate release tablet Take 1-2 tablets (5-10 mg total) by mouth every 3 (three)  hours as needed for breakthrough pain. 09/24/15   Watt Climes, PA  oxyCODONE (OXYCONTIN) 15 mg 12 hr tablet Take 1 tablet (15 mg total) by mouth every 12 (twelve) hours. 09/24/15   Watt Climes, PA  promethazine (PHENERGAN) 25 MG tablet Take 1 tablet (25 mg total) by mouth every 6 (six) hours as needed for nausea (with headache). 08/18/14   Tower, Wynelle Fanny, MD  traMADol (ULTRAM) 50 MG tablet Take 50 mg by mouth 2 (two) times daily. Maximum dose= 8 tablets per day    [provider]  traMADol (ULTRAM) 50 MG tablet Take 1-2 tablets (  50-100 mg total) by mouth every 4 (four) hours as needed for moderate pain. 09/24/15   Watt Climes, PA  vitamin B-12 (CYANOCOBALAMIN) 1000 MCG tablet Take 1,000 mcg by mouth at bedtime.     [provider]    Family History Family History  Problem Relation Age of Onset  . Arthritis Mother   . Fibromyalgia Mother   . Other Mother        Severe lymphedema  . Obesity Mother   . Obesity Brother   . Heart attack Father   . Coronary artery disease Father   . Ovarian cancer Maternal Aunt   . Breast cancer Paternal Aunt   . Coronary artery disease Paternal Uncle        all paternal uncles  . Stroke Unknown        GF  . Hypertension Unknown        a lot of family members  . Diabetes Unknown        GM  . Diabetes Brother     Social History Social History   Tobacco Use  . Smoking status: Never Smoker  . Smokeless tobacco: Never Used  Substance Use Topics  . Alcohol use: No    Alcohol/week: 0.0 oz  . Drug use: No     Allergies   Etodolac; Isometheptene-dichloral-apap; Spironolactone; and Amitriptyline   Review of Systems Review of Systems  Constitutional: Negative for fever.  HENT: Positive for rhinorrhea, sinus pressure and sore throat. Negative for congestion, ear discharge, sinus pain, trouble swallowing and voice change.   Respiratory: Positive for cough. Negative for shortness of breath, wheezing and stridor.     Cardiovascular: Negative for chest pain.  Gastrointestinal: Negative for abdominal pain, diarrhea, nausea and vomiting.  Genitourinary: Negative for dysuria, flank pain and pelvic pain.  Musculoskeletal: Negative for back pain and myalgias.  Skin: Negative for rash.  Neurological: Negative for dizziness and headaches.     Physical Exam Triage Vital Signs ED Triage Vitals  Enc Vitals Group     BP 01/20/18 1401 (!) 159/89     Pulse Rate 01/20/18 1401 79     Resp 01/20/18 1401 16     Temp 01/20/18 1401 98.1 F (36.7 C)     Temp Source 01/20/18 1401 Oral     SpO2 01/20/18 1401 100 %     Weight 01/20/18 1402 290 lb (131.5 kg)     Height 01/20/18 1402 5\' 4"  (1.626 m)     Head Circumference --      Peak Flow --      Pain Score 01/20/18 1401 4     Pain Loc --      Pain Edu? --      Excl. in [place]? --    No data found.  Updated Vital Signs BP (!) 159/89 (BP Location: Other (Comment)) Comment (BP Location): left forearm  Pulse 79   Temp 98.1 F (36.7 C) (Oral)   Resp 16   Ht 5\' 4"  (1.626 m)   Wt 290 lb (131.5 kg)   SpO2 100%   BMI 49.78 kg/m   Visual Acuity Right Eye Distance:   Left Eye Distance:   Bilateral Distance:    Right Eye Near:   Left Eye Near:    Bilateral Near:     Physical Exam  Constitutional: She is oriented to person, place, and time. She appears well-developed and well-nourished. No distress.  HENT:  Head: Normocephalic and atraumatic.  Right Ear: Hearing, tympanic membrane,  external ear and ear canal normal.  Left Ear: Hearing, tympanic membrane, external ear and ear canal normal.  Nose: Rhinorrhea present.  Mouth/Throat: Mucous membranes are normal. No trismus in the jaw. No uvula swelling. Posterior oropharyngeal erythema present. No oropharyngeal exudate, posterior oropharyngeal edema or tonsillar abscesses. No tonsillar exudate.  No frontal or maxillary sinus tenderness palpation.  Bilateral TMs with clear serous fluid with no erythema.  TMs are  intact.  Eyes: Conjunctivae are normal. Right eye exhibits no discharge. Left eye exhibits no discharge.  Neck: Normal range of motion.  Cardiovascular: Normal rate and regular rhythm.  Pulmonary/Chest: Effort normal and breath sounds normal. No stridor. No respiratory distress. She has no wheezes. She has no rales.  Abdominal: Soft. She exhibits no distension. There is no tenderness.  Musculoskeletal: Normal range of motion. She exhibits no deformity.  Lymphadenopathy:    She has no cervical adenopathy.  Neurological: She is alert and oriented to person, place, and time. She has normal reflexes.  Skin: Skin is warm and dry.  Psychiatric: She has a normal mood and affect. Her behavior is normal. Thought content normal.     UC Treatments / Results  Labs (all labs ordered are listed, but only abnormal results are displayed) Labs Reviewed - No data to display  EKG None Radiology No results found.  Procedures Procedures (including critical care time)  Medications Ordered in UC Medications - No data to display   Initial Impression / Assessment and Plan / UC Course  I have reviewed the triage vital signs and the nursing notes.  Pertinent labs & imaging results that were available during my care of the patient were reviewed by me and considered in my medical decision making (see chart for details).    62 year old female with sore throat, nasal drainage, ear pressure.  No signs of bacterial infection.  She is started on Claritin, pseudoephedrine, Flonase.  She will use Dostinex as needed nighttime for cough.  She is educated on signs symptoms return to clinic for.  Final Clinical Impressions(s) / UC Diagnoses   Final diagnoses:  Sinus pain  Seasonal allergies    ED Discharge Orders        Ordered    loratadine-pseudoephedrine (CLARITIN-D 24 HOUR) 10-240 MG 24 hr tablet  Daily     01/20/18 1421    fluticasone (FLONASE) 50 MCG/ACT nasal spray  Daily     01/20/18 1421     chlorpheniramine-HYDROcodone (TUSSIONEX PENNKINETIC ER) 10-8 MG/5ML SUER  At bedtime PRN     01/20/18 1421         Duanne Guess, [place] 01/20/18 1433

## 2018-05-14 ENCOUNTER — Other Ambulatory Visit: Payer: Self-pay | Admitting: Internal Medicine

## 2018-05-14 DIAGNOSIS — Z1231 Encounter for screening mammogram for malignant neoplasm of breast: Secondary | ICD-10-CM

## 2018-05-29 ENCOUNTER — Ambulatory Visit
Admission: RE | Admit: 2018-05-29 | Discharge: 2018-05-29 | Disposition: A | Discharge status: Home / Self Care | Payer: Managed Care, Other (non HMO) | Source: Ambulatory Visit | Attending: Internal Medicine | Admitting: Internal Medicine

## 2018-05-29 ENCOUNTER — Encounter (INDEPENDENT_AMBULATORY_CARE_PROVIDER_SITE_OTHER): Payer: Self-pay

## 2018-05-29 DIAGNOSIS — Z1231 Encounter for screening mammogram for malignant neoplasm of breast: Secondary | ICD-10-CM | POA: Diagnosis not present

## 2019-05-23 ENCOUNTER — Other Ambulatory Visit: Payer: Self-pay | Admitting: Orthopedic Surgery

## 2019-05-23 DIAGNOSIS — M1712 Unilateral primary osteoarthritis, left knee: Secondary | ICD-10-CM

## 2019-05-29 ENCOUNTER — Ambulatory Visit
Admission: RE | Admit: 2019-05-29 | Discharge: 2019-05-29 | Disposition: A | Discharge status: Home / Self Care | Payer: 59 | Source: Ambulatory Visit | Attending: Orthopedic Surgery | Admitting: Orthopedic Surgery

## 2019-05-29 ENCOUNTER — Other Ambulatory Visit: Payer: Self-pay

## 2019-05-29 DIAGNOSIS — M1712 Unilateral primary osteoarthritis, left knee: Secondary | ICD-10-CM | POA: Diagnosis not present

## 2019-07-14 ENCOUNTER — Ambulatory Visit
Admission: RE | Admit: 2019-07-14 | Discharge: 2019-07-14 | Disposition: A | Discharge status: Home / Self Care | Payer: Managed Care, Other (non HMO) | Source: Ambulatory Visit | Attending: Internal Medicine | Admitting: Internal Medicine

## 2019-07-14 ENCOUNTER — Other Ambulatory Visit: Payer: Self-pay | Admitting: Internal Medicine

## 2019-07-14 ENCOUNTER — Encounter (INDEPENDENT_AMBULATORY_CARE_PROVIDER_SITE_OTHER): Payer: Self-pay

## 2019-07-14 ENCOUNTER — Other Ambulatory Visit: Payer: Self-pay

## 2019-07-14 DIAGNOSIS — Z1231 Encounter for screening mammogram for malignant neoplasm of breast: Secondary | ICD-10-CM | POA: Diagnosis not present

## 2019-08-07 ENCOUNTER — Other Ambulatory Visit: Payer: Self-pay | Admitting: Orthopedic Surgery

## 2019-08-18 ENCOUNTER — Encounter
Admission: RE | Admit: 2019-08-18 | Discharge: 2019-08-18 | Disposition: A | Discharge status: Home / Self Care | Payer: Managed Care, Other (non HMO) | Source: Ambulatory Visit | Attending: Orthopedic Surgery | Admitting: Orthopedic Surgery

## 2019-08-18 ENCOUNTER — Other Ambulatory Visit: Payer: Self-pay

## 2019-08-18 DIAGNOSIS — Z01812 Encounter for preprocedural laboratory examination: Secondary | ICD-10-CM | POA: Insufficient documentation

## 2019-08-18 LAB — CBC WITH DIFFERENTIAL/PLATELET
Abs Immature Granulocytes: 0.03 10*3/uL (ref 0.00–0.07)
Basophils Absolute: 0 10*3/uL (ref 0.0–0.1)
Basophils Relative: 1 %
Eosinophils Absolute: 0.1 10*3/uL (ref 0.0–0.5)
Eosinophils Relative: 1 %
HCT: 41.8 % (ref 36.0–46.0)
Hemoglobin: 13.8 g/dL (ref 12.0–15.0)
Immature Granulocytes: 0 %
Lymphocytes Relative: 24 %
Lymphs Abs: 1.7 10*3/uL (ref 0.7–4.0)
MCH: 28.9 pg (ref 26.0–34.0)
MCHC: 33 g/dL (ref 30.0–36.0)
MCV: 87.6 fL (ref 80.0–100.0)
Monocytes Absolute: 0.5 10*3/uL (ref 0.1–1.0)
Monocytes Relative: 8 %
Neutro Abs: 4.6 10*3/uL (ref 1.7–7.7)
Neutrophils Relative %: 66 %
Platelets: 262 10*3/uL (ref 150–400)
RBC: 4.77 MIL/uL (ref 3.87–5.11)
RDW: 14 % (ref 11.5–15.5)
WBC: 6.9 10*3/uL (ref 4.0–10.5)
nRBC: 0 % (ref 0.0–0.2)

## 2019-08-18 LAB — URINALYSIS, ROUTINE W REFLEX MICROSCOPIC
Bilirubin Urine: NEGATIVE
Glucose, UA: NEGATIVE mg/dL
Hgb urine dipstick: NEGATIVE
Ketones, ur: NEGATIVE mg/dL
Leukocytes,Ua: NEGATIVE
Nitrite: NEGATIVE
Protein, ur: NEGATIVE mg/dL
Specific Gravity, Urine: 1.021 (ref 1.005–1.030)
pH: 7 (ref 5.0–8.0)

## 2019-08-18 LAB — COMPREHENSIVE METABOLIC PANEL
ALT: 36 U/L (ref 0–44)
AST: 34 U/L (ref 15–41)
Albumin: 4 g/dL (ref 3.5–5.0)
Alkaline Phosphatase: 77 U/L (ref 38–126)
Anion gap: 8 (ref 5–15)
BUN: 17 mg/dL (ref 8–23)
CO2: 27 mmol/L (ref 22–32)
Calcium: 9 mg/dL (ref 8.9–10.3)
Chloride: 105 mmol/L (ref 98–111)
Creatinine, Ser: 0.71 mg/dL (ref 0.44–1.00)
GFR calc Af Amer: >60 mL/min (ref >60)
GFR calc non Af Amer: >60 mL/min (ref >60)
Glucose, Bld: 91 mg/dL (ref 70–99)
Potassium: 4 mmol/L (ref 3.5–5.1)
Sodium: 140 mmol/L (ref 135–145)
Total Bilirubin: 1 mg/dL (ref 0.3–1.2)
Total Protein: 6.9 g/dL (ref 6.5–8.1)

## 2019-08-18 LAB — PROTIME-INR
INR: 1 (ref 0.8–1.2)
Prothrombin Time: 13.1 seconds (ref 11.4–15.2)

## 2019-08-18 LAB — TYPE AND SCREEN
ABO/RH(D): A POS
Antibody Screen: NEGATIVE

## 2019-08-18 LAB — APTT: aPTT: 29 seconds (ref 24–36)

## 2019-08-18 LAB — SURGICAL PCR SCREEN
MRSA, PCR: NEGATIVE
Staphylococcus aureus: NEGATIVE

## 2019-08-18 NOTE — Patient Instructions (Addendum)
Your procedure is scheduled on: Thursday 11-20-19  Report to [facility]. To find out your arrival time please call [phone] between 1PM - 3PM on Monday 08/25/19.   Remember: Instructions that are not followed completely may result in serious medical risk, up to and including death, or upon the discretion of your surgeon and anesthesiologist your surgery may need to be rescheduled.       _X__ 1. Do not eat food after midnight the night before your procedure.                 No gum chewing or hard candies. You may drink clear liquids up to 2 hours                 before you are scheduled to arrive for your surgery- DO NOT drink clear                 liquids within 2 hours of the start of your surgery.                 Clear Liquids include:  water, apple juice without pulp, clear carbohydrate                 drink such as Clearfast or Gatorade, Black Coffee or Tea (Do not add                 milk or creamer to coffee or tea).  ** Please finish the Pre-Surgery Ensure the morning of your surgery 2 hours before your arrival time.**    __X__2.  On the morning of surgery brush your teeth with toothpaste and water, you may rinse your mouth with mouthwash if you wish.  Do not swallow any toothpaste or mouthwash.       __X__3.  Notify your doctor if there is any change in your medical condition      (cold, fever, infections).       Do not wear jewelry, make-up, hairpins, clips or nail polish. Do not wear lotions, powders, or perfumes.  Do not shave 48 hours prior to surgery. Men may shave face and neck. Do not bring valuables to the hospital.      [facility] is not responsible for any belongings or valuables.    Contacts, dentures/partials or body piercings may not be worn into surgery. Bring a case for your contacts, glasses or hearing aids, a denture cup will be supplied.    Patients discharged the day of surgery will  not be allowed to drive home.     Please read over the following fact sheets that you were given:   MRSA Information    __X__ Take these medicines the morning of surgery with A SIP OF WATER:     1. traMADol (ULTRAM) 50 MG tablet if needed     __X__ Use CHG Soap as directed    __X__ Stop Anti-inflammatories 7 days before surgery such as Advil, Ibuprofen, Motrin, BC or Goodies Powder, Naprosyn, Naproxen, Aleve, Aspirin, Meloxicam. May take Tylenol, traMADol (ULTRAM) 50 MG tablet if needed for pain or discomfort.     __X__ Don't start taking any new herbal supplements.

## 2019-08-19 LAB — URINE CULTURE: Culture: <10000 — AB

## 2019-08-20 NOTE — Pre-Procedure Instructions (Signed)
Pre-Admit Testing Provider Notification Note  Provider Notified: Dr. Rudene Christians  Notification Mode: Fax  Reason: Urine culture results.  Response: Fax confirmation received.  Additional Information: Placed on chart. Noted on Pre-Admit Worksheet.  Signed: Beulah Gandy, RN

## 2019-08-22 ENCOUNTER — Other Ambulatory Visit
Admission: RE | Admit: 2019-08-22 | Discharge: 2019-08-22 | Disposition: A | Discharge status: Home / Self Care | Payer: Managed Care, Other (non HMO) | Source: Ambulatory Visit | Attending: Orthopedic Surgery | Admitting: Orthopedic Surgery

## 2019-08-22 ENCOUNTER — Other Ambulatory Visit: Payer: Self-pay

## 2019-08-22 DIAGNOSIS — Z01812 Encounter for preprocedural laboratory examination: Secondary | ICD-10-CM | POA: Insufficient documentation

## 2019-08-22 DIAGNOSIS — Z20828 Contact with and (suspected) exposure to other viral communicable diseases: Secondary | ICD-10-CM | POA: Diagnosis not present

## 2019-08-23 LAB — SARS CORONAVIRUS 2 (TAT 6-24 HRS): SARS Coronavirus 2: NEGATIVE

## 2019-09-30 ENCOUNTER — Encounter: Admission: RE | Payer: Self-pay | Source: Home / Self Care

## 2019-09-30 ENCOUNTER — Inpatient Hospital Stay
Admission: RE | Admit: 2019-09-30 | Payer: Managed Care, Other (non HMO) | Source: Home / Self Care | Admitting: Orthopedic Surgery

## 2019-09-30 SURGERY — ARTHROPLASTY, KNEE, TOTAL
Anesthesia: Choice | Site: Knee | Laterality: Left

## 2019-11-03 ENCOUNTER — Other Ambulatory Visit: Payer: Self-pay | Admitting: Orthopedic Surgery

## 2019-11-14 NOTE — Addendum Note (Signed)
Encounter addended by: Melanee Left, RN on: 11/14/2019 12:26 PM  Actions taken: Clinical Note Signed

## 2019-11-18 ENCOUNTER — Other Ambulatory Visit: Payer: Self-pay

## 2019-11-18 ENCOUNTER — Encounter
Admission: RE | Admit: 2019-11-18 | Discharge: 2019-11-18 | Disposition: A | Discharge status: Home / Self Care | Payer: Managed Care, Other (non HMO) | Source: Ambulatory Visit | Attending: Orthopedic Surgery | Admitting: Orthopedic Surgery

## 2019-11-18 DIAGNOSIS — Z01812 Encounter for preprocedural laboratory examination: Secondary | ICD-10-CM | POA: Insufficient documentation

## 2019-11-18 DIAGNOSIS — U071 COVID-19: Secondary | ICD-10-CM | POA: Diagnosis not present

## 2019-11-18 DIAGNOSIS — Z0183 Encounter for blood typing: Secondary | ICD-10-CM | POA: Insufficient documentation

## 2019-11-18 LAB — CBC WITH DIFFERENTIAL/PLATELET
Abs Immature Granulocytes: 0.02 10*3/uL (ref 0.00–0.07)
Basophils Absolute: 0 10*3/uL (ref 0.0–0.1)
Basophils Relative: 1 %
Eosinophils Absolute: 0 10*3/uL (ref 0.0–0.5)
Eosinophils Relative: 0 %
HCT: 40.5 % (ref 36.0–46.0)
Hemoglobin: 13.1 g/dL (ref 12.0–15.0)
Immature Granulocytes: 1 %
Lymphocytes Relative: 18 %
Lymphs Abs: 0.7 10*3/uL (ref 0.7–4.0)
MCH: 29 pg (ref 26.0–34.0)
MCHC: 32.3 g/dL (ref 30.0–36.0)
MCV: 89.6 fL (ref 80.0–100.0)
Monocytes Absolute: 0.9 10*3/uL (ref 0.1–1.0)
Monocytes Relative: 22 %
Neutro Abs: 2.3 10*3/uL (ref 1.7–7.7)
Neutrophils Relative %: 58 %
Platelets: 180 10*3/uL (ref 150–400)
RBC: 4.52 MIL/uL (ref 3.87–5.11)
RDW: 13.6 % (ref 11.5–15.5)
WBC: 3.9 10*3/uL — ABNORMAL LOW (ref 4.0–10.5)
nRBC: 0 % (ref 0.0–0.2)

## 2019-11-18 LAB — TYPE AND SCREEN
ABO/RH(D): A POS
Antibody Screen: NEGATIVE

## 2019-11-18 LAB — COMPREHENSIVE METABOLIC PANEL
ALT: 24 U/L (ref 0–44)
AST: 30 U/L (ref 15–41)
Albumin: 3.3 g/dL — ABNORMAL LOW (ref 3.5–5.0)
Alkaline Phosphatase: 63 U/L (ref 38–126)
Anion gap: 7 (ref 5–15)
BUN: 12 mg/dL (ref 8–23)
CO2: 26 mmol/L (ref 22–32)
Calcium: 8.5 mg/dL — ABNORMAL LOW (ref 8.9–10.3)
Chloride: 105 mmol/L (ref 98–111)
Creatinine, Ser: 0.66 mg/dL (ref 0.44–1.00)
GFR calc Af Amer: >60 mL/min (ref >60)
GFR calc non Af Amer: >60 mL/min (ref >60)
Glucose, Bld: 116 mg/dL — ABNORMAL HIGH (ref 70–99)
Potassium: 4 mmol/L (ref 3.5–5.1)
Sodium: 138 mmol/L (ref 135–145)
Total Bilirubin: 0.8 mg/dL (ref 0.3–1.2)
Total Protein: 6.2 g/dL — ABNORMAL LOW (ref 6.5–8.1)

## 2019-11-18 LAB — URINALYSIS, ROUTINE W REFLEX MICROSCOPIC
Bilirubin Urine: NEGATIVE
Glucose, UA: NEGATIVE mg/dL
Hgb urine dipstick: NEGATIVE
Ketones, ur: NEGATIVE mg/dL
Leukocytes,Ua: NEGATIVE
Nitrite: NEGATIVE
Protein, ur: NEGATIVE mg/dL
Specific Gravity, Urine: 1.021 (ref 1.005–1.030)
pH: 6 (ref 5.0–8.0)

## 2019-11-18 LAB — SARS CORONAVIRUS 2 (TAT 6-24 HRS): SARS Coronavirus 2: POSITIVE — AB

## 2019-11-18 LAB — SURGICAL PCR SCREEN
MRSA, PCR: NEGATIVE
Staphylococcus aureus: NEGATIVE

## 2019-11-18 LAB — APTT: aPTT: 29 seconds (ref 24–36)

## 2019-11-18 LAB — PROTIME-INR
INR: 1 (ref 0.8–1.2)
Prothrombin Time: 13.5 seconds (ref 11.4–15.2)

## 2019-11-19 ENCOUNTER — Ambulatory Visit (HOSPITAL_COMMUNITY)
Admission: RE | Admit: 2019-11-19 | Discharge: 2019-11-19 | Disposition: A | Discharge status: Home / Self Care | Payer: Managed Care, Other (non HMO) | Source: Ambulatory Visit | Attending: Pulmonary Disease | Admitting: Pulmonary Disease

## 2019-11-19 ENCOUNTER — Other Ambulatory Visit: Payer: Self-pay | Admitting: Physician Assistant

## 2019-11-19 ENCOUNTER — Encounter: Payer: Self-pay | Admitting: Physician Assistant

## 2019-11-19 ENCOUNTER — Telehealth: Payer: Self-pay | Admitting: Physician Assistant

## 2019-11-19 DIAGNOSIS — U071 COVID-19: Secondary | ICD-10-CM | POA: Insufficient documentation

## 2019-11-19 DIAGNOSIS — D849 Immunodeficiency, unspecified: Secondary | ICD-10-CM | POA: Insufficient documentation

## 2019-11-19 LAB — URINE CULTURE: Culture: 10000 — AB

## 2019-11-19 MED ORDER — SODIUM CHLORIDE 0.9 % IV SOLN
INTRAVENOUS | Status: DC | PRN
  Administered 2019-11-19: 250 mL via INTRAVENOUS

## 2019-11-19 MED ORDER — DIPHENHYDRAMINE HCL 50 MG/ML IJ SOLN: 50.0000 mg | Freq: Once | INTRAMUSCULAR | Status: DC | PRN

## 2019-11-19 MED ORDER — EPINEPHRINE 0.3 MG/0.3ML IJ SOAJ: 0.3000 mg | Freq: Once | INTRAMUSCULAR | Status: DC | PRN

## 2019-11-19 MED ORDER — FAMOTIDINE IN NACL 20-0.9 MG/50ML-% IV SOLN: 20.0000 mg | Freq: Once | INTRAVENOUS | Status: DC | PRN

## 2019-11-19 MED ORDER — SODIUM CHLORIDE 0.9 % IV SOLN
700.0000 mg | Freq: Once | INTRAVENOUS | Status: AC
  Administered 2019-11-19: 700 mg via INTRAVENOUS
  Filled 2019-11-19: qty 20

## 2019-11-19 MED ORDER — ALBUTEROL SULFATE HFA 108 (90 BASE) MCG/ACT IN AERS: 2.0000 | INHALATION_SPRAY | Freq: Once | RESPIRATORY_TRACT | Status: DC | PRN

## 2019-11-19 MED ORDER — METHYLPREDNISOLONE SODIUM SUCC 125 MG IJ SOLR: 125.0000 mg | Freq: Once | INTRAMUSCULAR | Status: DC | PRN

## 2019-11-19 NOTE — Progress Notes (Signed)
  Diagnosis: COVID-19  Physician: Dr. Tracie Harrier  Procedure: [facility] Med: bamlanivimab infusion - Provided patient with bamlanimivab fact sheet for patients, parents and caregivers prior to infusion.  Complications: No immediate complications noted.  Discharge: Discharged home   Michelle Jordan L 11/19/2019

## 2019-11-19 NOTE — Telephone Encounter (Signed)
  I connected by phone with Michelle Jordan on 11/19/2019 at 8:50 AM to discuss the potential use of an new treatment for mild to moderate COVID-19 viral infection in non-hospitalized patients.  This patient is a 64 y.o. female that meets the FDA criteria for Emergency Use Authorization of bamlanivimab or casirivimab\imdevimab.  Has a (+) direct SARS-CoV-2 viral test result  Has mild or moderate COVID-19   Is ? 63 years of age and weighs ? 40 kg  Is NOT hospitalized due to COVID-19  Is NOT requiring oxygen therapy or requiring an increase in baseline oxygen flow rate due to COVID-19  Is within 10 days of symptom onset  Has at least one of the high risk factor(s) for progression to severe COVID-19 and/or hospitalization as defined in EUA.  Specific high risk criteria : Immunosuppressive Disease (psoriatic arthritis on MTX and morbid obesity with BMI >31)   I have spoken and communicated the following to the patient or parent/caregiver:  1. FDA has authorized the emergency use of bamlanivimab and casirivimab\imdevimab for the treatment of mild to moderate COVID-19 in adults and pediatric patients with positive results of direct SARS-CoV-2 viral testing who are 46 years of age and older weighing at least 40 kg, and who are at high risk for progressing to severe COVID-19 and/or hospitalization.  2. The significant known and potential risks and benefits of bamlanivimab and casirivimab\imdevimab, and the extent to which such potential risks and benefits are unknown.  3. Information on available alternative treatments and the risks and benefits of those alternatives, including clinical trials.  4. Patients treated with bamlanivimab and casirivimab\imdevimab should continue to self-isolate and use infection control measures (e.g., wear mask, isolate, social distance, avoid sharing personal items, clean and disinfect "high touch" surfaces, and frequent handwashing) according to CDC guidelines.    5. The patient or parent/caregiver has the option to accept or refuse bamlanivimab or casirivimab\imdevimab .  After reviewing this information with the patient, The patient agreed to proceed with receiving the bamlanimivab infusion and will be provided a copy of the Fact sheet prior to receiving the infusion.   Sx onset 2/22 (nasal congestion and sinus pressure). Pt is set up for monoclonal antibody today at 12:30 pm. Directions given. Will CC her orthopedic surgeon as he surgery will need to be pushed out at least 10 day from symptom onset.   Angelena Form PA-C 11/19/2019 8:50 AM

## 2019-11-25 ENCOUNTER — Other Ambulatory Visit: Payer: Self-pay | Admitting: Orthopedic Surgery

## 2019-12-03 NOTE — Pre-Procedure Instructions (Signed)
The patient was called to confirm that she still had instructions, soap, and drink from the previously scheduled surgery that was canceled. She confirmed. Questions were encouraged and answered.

## 2019-12-04 ENCOUNTER — Inpatient Hospital Stay: Admission: RE | Admit: 2019-12-04 | Payer: Managed Care, Other (non HMO) | Source: Ambulatory Visit

## 2019-12-08 ENCOUNTER — Other Ambulatory Visit: Payer: Managed Care, Other (non HMO)

## 2019-12-08 ENCOUNTER — Other Ambulatory Visit: Payer: Self-pay

## 2019-12-08 ENCOUNTER — Encounter
Admission: RE | Admit: 2019-12-08 | Discharge: 2019-12-08 | Disposition: A | Discharge status: Home / Self Care | Payer: Managed Care, Other (non HMO) | Source: Ambulatory Visit | Attending: Orthopedic Surgery | Admitting: Orthopedic Surgery

## 2019-12-08 DIAGNOSIS — Z01812 Encounter for preprocedural laboratory examination: Secondary | ICD-10-CM | POA: Insufficient documentation

## 2019-12-08 LAB — URINALYSIS, ROUTINE W REFLEX MICROSCOPIC
Bilirubin Urine: NEGATIVE
Glucose, UA: NEGATIVE mg/dL
Hgb urine dipstick: NEGATIVE
Ketones, ur: NEGATIVE mg/dL
Leukocytes,Ua: NEGATIVE
Nitrite: NEGATIVE
Protein, ur: NEGATIVE mg/dL
Specific Gravity, Urine: 1.025 (ref 1.005–1.030)
pH: 6 (ref 5.0–8.0)

## 2019-12-08 LAB — APTT: aPTT: 31 seconds (ref 24–36)

## 2019-12-08 LAB — PROTIME-INR
INR: 1 (ref 0.8–1.2)
Prothrombin Time: 13.3 seconds (ref 11.4–15.2)

## 2019-12-08 LAB — TYPE AND SCREEN
ABO/RH(D): A POS
Antibody Screen: NEGATIVE

## 2019-12-09 ENCOUNTER — Other Ambulatory Visit: Payer: Managed Care, Other (non HMO)

## 2019-12-09 LAB — URINE CULTURE: Culture: 30000 — AB

## 2019-12-09 NOTE — TOC Progression Note (Signed)
Transition of Care [facility]) - Progression Note    Patient Details  Name: Michelle Jordan MRN: [id number] Date of Birth: 1956/09/04  Transition of [facility]) CM/SW [place], RN Phone Number: 12/09/2019, 3:16 PM  Clinical Narrative:    Requested the price of Lovenox will notify the patient once obtained        Expected Discharge Plan and Services                                                 Social Determinants of Health (SDOH) Interventions    Readmission Risk Interventions No flowsheet data found.

## 2019-12-10 MED ORDER — TRANEXAMIC ACID-NACL 1000-0.7 MG/100ML-% IV SOLN
1000.0000 mg | INTRAVENOUS | Status: AC
  Administered 2019-12-11: 1000 mg via INTRAVENOUS

## 2019-12-10 MED ORDER — CLINDAMYCIN PHOSPHATE 900 MG/50ML IV SOLN
900.0000 mg | INTRAVENOUS | Status: AC
  Administered 2019-12-11: 900 mg via INTRAVENOUS

## 2019-12-10 NOTE — TOC Benefit Eligibility Note (Signed)
Transition of Care [facility]) Benefit Eligibility Note    Patient Details  Name: Michelle Jordan MRN: [id number] Date of Birth: Nov 21, 1955   Medication/Dose: Carron Curie Shirley Friar 40mg  x14  Covered?: Yes     Prescription Coverage Preferred Pharmacy: [facility]  Spoke with Person/Company/Phone Number:: [phone]  Co-Pay: brand name: 40% of total cost of the medication at retail  // generic: $5.00 for 30 day retail  Prior Approval: No          [place] Phone Number: 12/10/2019, 10:20 AM

## 2019-12-11 ENCOUNTER — Other Ambulatory Visit: Payer: Self-pay

## 2019-12-11 ENCOUNTER — Inpatient Hospital Stay: Payer: Managed Care, Other (non HMO) | Admitting: Anesthesiology

## 2019-12-11 ENCOUNTER — Encounter
Admission: RE | Disposition: A | Discharge status: Home Health | Payer: Self-pay | Source: Home / Self Care | Attending: Orthopedic Surgery

## 2019-12-11 ENCOUNTER — Observation Stay: Payer: Managed Care, Other (non HMO)

## 2019-12-11 ENCOUNTER — Encounter: Payer: Self-pay | Admitting: Orthopedic Surgery

## 2019-12-11 ENCOUNTER — Inpatient Hospital Stay
Admission: RE | Admit: 2019-12-11 | Discharge: 2019-12-14 | DRG: 470 | Disposition: A | Discharge status: Home Health | Payer: Managed Care, Other (non HMO) | Attending: Orthopedic Surgery | Admitting: Orthopedic Surgery

## 2019-12-11 DIAGNOSIS — Z8349 Family history of other endocrine, nutritional and metabolic diseases: Secondary | ICD-10-CM | POA: Diagnosis not present

## 2019-12-11 DIAGNOSIS — K5790 Diverticulosis of intestine, part unspecified, without perforation or abscess without bleeding: Secondary | ICD-10-CM | POA: Diagnosis present

## 2019-12-11 DIAGNOSIS — Z8041 Family history of malignant neoplasm of ovary: Secondary | ICD-10-CM

## 2019-12-11 DIAGNOSIS — Z96652 Presence of left artificial knee joint: Secondary | ICD-10-CM

## 2019-12-11 DIAGNOSIS — Z800 Family history of malignant neoplasm of digestive organs: Secondary | ICD-10-CM

## 2019-12-11 DIAGNOSIS — Z96651 Presence of right artificial knee joint: Secondary | ICD-10-CM | POA: Diagnosis present

## 2019-12-11 DIAGNOSIS — R000 Tachycardia, unspecified: Secondary | ICD-10-CM | POA: Diagnosis present

## 2019-12-11 DIAGNOSIS — Z6841 Body Mass Index (BMI) 40.0 and over, adult: Secondary | ICD-10-CM

## 2019-12-11 DIAGNOSIS — E669 Obesity, unspecified: Secondary | ICD-10-CM | POA: Diagnosis present

## 2019-12-11 DIAGNOSIS — H209 Unspecified iridocyclitis: Secondary | ICD-10-CM | POA: Diagnosis present

## 2019-12-11 DIAGNOSIS — M1712 Unilateral primary osteoarthritis, left knee: Principal | ICD-10-CM | POA: Diagnosis present

## 2019-12-11 DIAGNOSIS — Z888 Allergy status to other drugs, medicaments and biological substances status: Secondary | ICD-10-CM | POA: Diagnosis not present

## 2019-12-11 DIAGNOSIS — E785 Hyperlipidemia, unspecified: Secondary | ICD-10-CM | POA: Diagnosis present

## 2019-12-11 DIAGNOSIS — M109 Gout, unspecified: Secondary | ICD-10-CM | POA: Diagnosis present

## 2019-12-11 DIAGNOSIS — Z8261 Family history of arthritis: Secondary | ICD-10-CM | POA: Diagnosis not present

## 2019-12-11 DIAGNOSIS — F419 Anxiety disorder, unspecified: Secondary | ICD-10-CM | POA: Diagnosis present

## 2019-12-11 DIAGNOSIS — L409 Psoriasis, unspecified: Secondary | ICD-10-CM | POA: Diagnosis present

## 2019-12-11 DIAGNOSIS — Z20822 Contact with and (suspected) exposure to covid-19: Secondary | ICD-10-CM | POA: Diagnosis present

## 2019-12-11 DIAGNOSIS — Z8249 Family history of ischemic heart disease and other diseases of the circulatory system: Secondary | ICD-10-CM | POA: Diagnosis not present

## 2019-12-11 DIAGNOSIS — K589 Irritable bowel syndrome without diarrhea: Secondary | ICD-10-CM | POA: Diagnosis present

## 2019-12-11 DIAGNOSIS — Z823 Family history of stroke: Secondary | ICD-10-CM | POA: Diagnosis not present

## 2019-12-11 DIAGNOSIS — I10 Essential (primary) hypertension: Secondary | ICD-10-CM | POA: Diagnosis present

## 2019-12-11 DIAGNOSIS — L719 Rosacea, unspecified: Secondary | ICD-10-CM | POA: Diagnosis present

## 2019-12-11 DIAGNOSIS — G8918 Other acute postprocedural pain: Secondary | ICD-10-CM

## 2019-12-11 DIAGNOSIS — E039 Hypothyroidism, unspecified: Secondary | ICD-10-CM | POA: Diagnosis present

## 2019-12-11 DIAGNOSIS — Z880 Allergy status to penicillin: Secondary | ICD-10-CM | POA: Diagnosis not present

## 2019-12-11 DIAGNOSIS — Z803 Family history of malignant neoplasm of breast: Secondary | ICD-10-CM

## 2019-12-11 DIAGNOSIS — Z833 Family history of diabetes mellitus: Secondary | ICD-10-CM

## 2019-12-11 DIAGNOSIS — G44009 Cluster headache syndrome, unspecified, not intractable: Secondary | ICD-10-CM | POA: Diagnosis present

## 2019-12-11 DIAGNOSIS — M21162 Varus deformity, not elsewhere classified, left knee: Secondary | ICD-10-CM | POA: Diagnosis present

## 2019-12-11 HISTORY — PX: TOTAL KNEE ARTHROPLASTY: SHX125

## 2019-12-11 LAB — CBC
HCT: 34 % — ABNORMAL LOW (ref 36.0–46.0)
Hemoglobin: 10.7 g/dL — ABNORMAL LOW (ref 12.0–15.0)
MCH: 28.8 pg (ref 26.0–34.0)
MCHC: 31.5 g/dL (ref 30.0–36.0)
MCV: 91.4 fL (ref 80.0–100.0)
Platelets: 203 10*3/uL (ref 150–400)
RBC: 3.72 MIL/uL — ABNORMAL LOW (ref 3.87–5.11)
RDW: 13.9 % (ref 11.5–15.5)
WBC: 7.2 10*3/uL (ref 4.0–10.5)
nRBC: 0 % (ref 0.0–0.2)

## 2019-12-11 LAB — CREATININE, SERUM
Creatinine, Ser: 0.5 mg/dL (ref 0.44–1.00)
GFR calc Af Amer: >60 mL/min (ref >60)
GFR calc non Af Amer: >60 mL/min (ref >60)

## 2019-12-11 SURGERY — ARTHROPLASTY, KNEE, TOTAL
Anesthesia: Spinal | Site: Knee | Laterality: Left

## 2019-12-11 MED ORDER — PROPOFOL 500 MG/50ML IV EMUL
INTRAVENOUS | Status: AC
  Filled 2019-12-11: qty 50

## 2019-12-11 MED ORDER — PROMETHAZINE HCL 25 MG PO TABS
25.0000 mg | ORAL_TABLET | Freq: Four times a day (QID) | ORAL | Status: DC | PRN
  Filled 2019-12-11: qty 1

## 2019-12-11 MED ORDER — MENTHOL 3 MG MT LOZG
1.0000 | LOZENGE | OROMUCOSAL | Status: DC | PRN
  Filled 2019-12-11: qty 9

## 2019-12-11 MED ORDER — PHENOL 1.4 % MT LIQD
1.0000 | OROMUCOSAL | Status: DC | PRN
  Filled 2019-12-11: qty 177

## 2019-12-11 MED ORDER — OXYCODONE HCL 5 MG PO TABS
10.0000 mg | ORAL_TABLET | ORAL | Status: DC | PRN
  Administered 2019-12-11: 21:00:00 15 mg via ORAL
  Administered 2019-12-12: 12:00:00 10 mg via ORAL
  Administered 2019-12-12: 02:00:00 15 mg via ORAL
  Administered 2019-12-12: 08:00:00 10 mg via ORAL
  Administered 2019-12-12 – 2019-12-14 (×5): 15 mg via ORAL
  Filled 2019-12-11: qty 2
  Filled 2019-12-11: qty 3
  Filled 2019-12-11 (×2): qty 2
  Filled 2019-12-11 (×5): qty 3
  Filled 2019-12-11: qty 2
  Filled 2019-12-11: qty 3

## 2019-12-11 MED ORDER — BUPIVACAINE HCL (PF) 0.5 % IJ SOLN
INTRAMUSCULAR | Status: DC | PRN
  Administered 2019-12-11: 3 mL

## 2019-12-11 MED ORDER — MIDAZOLAM HCL 2 MG/2ML IJ SOLN
INTRAMUSCULAR | Status: AC
  Filled 2019-12-11: qty 2

## 2019-12-11 MED ORDER — FENTANYL CITRATE (PF) 100 MCG/2ML IJ SOLN: 25.0000 ug | INTRAMUSCULAR | Status: DC | PRN

## 2019-12-11 MED ORDER — FAMOTIDINE 20 MG PO TABS: 20.0000 mg | ORAL_TABLET | Freq: Once | ORAL | Status: AC

## 2019-12-11 MED ORDER — CILIDINIUM-CHLORDIAZEPOXIDE 2.5-5 MG PO CAPS: 1.0000 | ORAL_CAPSULE | Freq: Two times a day (BID) | ORAL | Status: DC | PRN

## 2019-12-11 MED ORDER — PHENYLEPHRINE HCL (PRESSORS) 10 MG/ML IV SOLN: INTRAVENOUS | Status: DC | PRN

## 2019-12-11 MED ORDER — ONDANSETRON HCL 4 MG PO TABS: 4.0000 mg | ORAL_TABLET | Freq: Four times a day (QID) | ORAL | Status: DC | PRN

## 2019-12-11 MED ORDER — PROPOFOL 500 MG/50ML IV EMUL
INTRAVENOUS | Status: DC | PRN
  Administered 2019-12-11: 75 ug/kg/min via INTRAVENOUS

## 2019-12-11 MED ORDER — OXYCODONE HCL 5 MG/5ML PO SOLN: 5.0000 mg | Freq: Once | ORAL | Status: DC | PRN

## 2019-12-11 MED ORDER — FENTANYL CITRATE (PF) 100 MCG/2ML IJ SOLN
INTRAMUSCULAR | Status: AC
  Filled 2019-12-11: qty 2

## 2019-12-11 MED ORDER — PROPOFOL 10 MG/ML IV BOLUS
INTRAVENOUS | Status: DC | PRN
  Administered 2019-12-11: 50 mg via INTRAVENOUS

## 2019-12-11 MED ORDER — MAGNESIUM CITRATE PO SOLN
1.0000 | Freq: Once | ORAL | Status: DC | PRN
  Filled 2019-12-11: qty 296

## 2019-12-11 MED ORDER — BISACODYL 10 MG RE SUPP
10.0000 mg | Freq: Every day | RECTAL | Status: DC | PRN
  Administered 2019-12-14: 11:00:00 10 mg via RECTAL
  Filled 2019-12-11: qty 1

## 2019-12-11 MED ORDER — FAMOTIDINE 20 MG PO TABS
ORAL_TABLET | ORAL | Status: AC
  Administered 2019-12-11: 07:00:00 20 mg via ORAL
  Filled 2019-12-11: qty 1

## 2019-12-11 MED ORDER — CHLORHEXIDINE GLUCONATE 4 % EX LIQD: 60.0000 mL | Freq: Once | CUTANEOUS | Status: DC

## 2019-12-11 MED ORDER — NEOMYCIN-POLYMYXIN B GU 40-200000 IR SOLN
Status: DC | PRN
  Administered 2019-12-11: 14 mL

## 2019-12-11 MED ORDER — TRAMADOL HCL 50 MG PO TABS
50.0000 mg | ORAL_TABLET | Freq: Four times a day (QID) | ORAL | Status: DC
  Administered 2019-12-11 – 2019-12-14 (×9): 50 mg via ORAL
  Filled 2019-12-11 (×10): qty 1

## 2019-12-11 MED ORDER — ENOXAPARIN SODIUM 30 MG/0.3ML ~~LOC~~ SOLN
30.0000 mg | Freq: Two times a day (BID) | SUBCUTANEOUS | Status: DC
  Administered 2019-12-12 – 2019-12-14 (×5): 30 mg via SUBCUTANEOUS
  Filled 2019-12-11 (×4): qty 0.3

## 2019-12-11 MED ORDER — CLINDAMYCIN PHOSPHATE 900 MG/50ML IV SOLN
INTRAVENOUS | Status: AC
  Filled 2019-12-11: qty 50

## 2019-12-11 MED ORDER — HYDROMORPHONE HCL 1 MG/ML IJ SOLN
0.5000 mg | INTRAMUSCULAR | Status: DC | PRN
  Administered 2019-12-11: 1 mg via INTRAVENOUS
  Administered 2019-12-12: 14:00:00 0.5 mg via INTRAVENOUS
  Filled 2019-12-11 (×2): qty 1

## 2019-12-11 MED ORDER — CLINDAMYCIN PHOSPHATE 900 MG/50ML IV SOLN
900.0000 mg | Freq: Four times a day (QID) | INTRAVENOUS | Status: AC
  Administered 2019-12-11 – 2019-12-12 (×3): 900 mg via INTRAVENOUS
  Filled 2019-12-11 (×5): qty 50

## 2019-12-11 MED ORDER — ALUM & MAG HYDROXIDE-SIMETH 200-200-20 MG/5ML PO SUSP: 30.0000 mL | ORAL | Status: DC | PRN

## 2019-12-11 MED ORDER — OXYCODONE HCL 5 MG PO TABS
5.0000 mg | ORAL_TABLET | ORAL | Status: DC | PRN
  Administered 2019-12-11: 14:00:00 5 mg via ORAL
  Filled 2019-12-11: qty 1

## 2019-12-11 MED ORDER — BUPIVACAINE-EPINEPHRINE (PF) 0.25% -1:200000 IJ SOLN
INTRAMUSCULAR | Status: DC | PRN
  Administered 2019-12-11: 30 mL

## 2019-12-11 MED ORDER — BUPIVACAINE-EPINEPHRINE (PF) 0.25% -1:200000 IJ SOLN
INTRAMUSCULAR | Status: AC
  Filled 2019-12-11: qty 30

## 2019-12-11 MED ORDER — SODIUM CHLORIDE FLUSH 0.9 % IV SOLN
INTRAVENOUS | Status: AC
  Filled 2019-12-11: qty 40

## 2019-12-11 MED ORDER — PHENYLEPHRINE HCL (PRESSORS) 10 MG/ML IV SOLN
INTRAVENOUS | Status: DC | PRN
  Administered 2019-12-11 (×2): 100 ug via INTRAVENOUS

## 2019-12-11 MED ORDER — CLOBETASOL PROPIONATE 0.05 % EX CREA
1.0000 "application " | TOPICAL_CREAM | Freq: Two times a day (BID) | CUTANEOUS | Status: DC | PRN
  Filled 2019-12-11: qty 15

## 2019-12-11 MED ORDER — LACTATED RINGERS IV SOLN: INTRAVENOUS | Status: DC

## 2019-12-11 MED ORDER — METHOCARBAMOL 500 MG PO TABS
500.0000 mg | ORAL_TABLET | Freq: Four times a day (QID) | ORAL | Status: DC | PRN
  Administered 2019-12-11 – 2019-12-14 (×3): 500 mg via ORAL
  Filled 2019-12-11 (×3): qty 1

## 2019-12-11 MED ORDER — SODIUM CHLORIDE 0.9 % IV SOLN
INTRAVENOUS | Status: DC | PRN
  Administered 2019-12-11: 60 mL

## 2019-12-11 MED ORDER — MORPHINE SULFATE (PF) 10 MG/ML IV SOLN
INTRAVENOUS | Status: AC
  Filled 2019-12-11: qty 1

## 2019-12-11 MED ORDER — ZOLPIDEM TARTRATE 5 MG PO TABS
5.0000 mg | ORAL_TABLET | Freq: Every evening | ORAL | Status: DC | PRN
  Administered 2019-12-11 – 2019-12-13 (×3): 5 mg via ORAL
  Filled 2019-12-11 (×3): qty 1

## 2019-12-11 MED ORDER — SODIUM CHLORIDE 0.9 % IV SOLN: INTRAVENOUS | Status: DC | PRN

## 2019-12-11 MED ORDER — SODIUM CHLORIDE 0.9 % IV SOLN: INTRAVENOUS | Status: DC

## 2019-12-11 MED ORDER — ONDANSETRON HCL 4 MG/2ML IJ SOLN
INTRAMUSCULAR | Status: DC | PRN
  Administered 2019-12-11: 4 mg via INTRAVENOUS

## 2019-12-11 MED ORDER — ACETAMINOPHEN 325 MG PO TABS: 325.0000 mg | ORAL_TABLET | Freq: Four times a day (QID) | ORAL | Status: DC | PRN

## 2019-12-11 MED ORDER — DIPHENHYDRAMINE HCL 12.5 MG/5ML PO ELIX: 12.5000 mg | ORAL_SOLUTION | ORAL | Status: DC | PRN

## 2019-12-11 MED ORDER — TRANEXAMIC ACID-NACL 1000-0.7 MG/100ML-% IV SOLN
INTRAVENOUS | Status: AC
  Filled 2019-12-11: qty 100

## 2019-12-11 MED ORDER — ACETAMINOPHEN 500 MG PO TABS
1000.0000 mg | ORAL_TABLET | Freq: Four times a day (QID) | ORAL | Status: AC
  Administered 2019-12-11 – 2019-12-12 (×3): 1000 mg via ORAL
  Filled 2019-12-11 (×3): qty 2

## 2019-12-11 MED ORDER — MAGNESIUM HYDROXIDE 400 MG/5ML PO SUSP
30.0000 mL | Freq: Every day | ORAL | Status: DC | PRN
  Administered 2019-12-14: 09:00:00 30 mL via ORAL
  Filled 2019-12-11: qty 30

## 2019-12-11 MED ORDER — FOLIC ACID 1 MG PO TABS
1.0000 mg | ORAL_TABLET | Freq: Every day | ORAL | Status: DC
  Administered 2019-12-11 – 2019-12-13 (×3): 1 mg via ORAL
  Filled 2019-12-11 (×3): qty 1

## 2019-12-11 MED ORDER — DOCUSATE SODIUM 100 MG PO CAPS
100.0000 mg | ORAL_CAPSULE | Freq: Two times a day (BID) | ORAL | Status: DC
  Administered 2019-12-11 – 2019-12-14 (×6): 100 mg via ORAL
  Filled 2019-12-11 (×6): qty 1

## 2019-12-11 MED ORDER — NEOMYCIN-POLYMYXIN B GU 40-200000 IR SOLN
Status: AC
  Filled 2019-12-11: qty 20

## 2019-12-11 MED ORDER — PANTOPRAZOLE SODIUM 40 MG PO TBEC
40.0000 mg | DELAYED_RELEASE_TABLET | Freq: Every day | ORAL | Status: DC
  Administered 2019-12-12 – 2019-12-14 (×3): 40 mg via ORAL
  Filled 2019-12-11 (×3): qty 1

## 2019-12-11 MED ORDER — OXYCODONE HCL 5 MG PO TABS: 5.0000 mg | ORAL_TABLET | Freq: Once | ORAL | Status: DC | PRN

## 2019-12-11 MED ORDER — LIDOCAINE HCL (CARDIAC) PF 100 MG/5ML IV SOSY
PREFILLED_SYRINGE | INTRAVENOUS | Status: DC | PRN
  Administered 2019-12-11: 100 mg via INTRAVENOUS

## 2019-12-11 MED ORDER — MIDAZOLAM HCL 5 MG/5ML IJ SOLN
INTRAMUSCULAR | Status: DC | PRN
  Administered 2019-12-11: 2 mg via INTRAVENOUS

## 2019-12-11 MED ORDER — ONDANSETRON HCL 4 MG/2ML IJ SOLN: 4.0000 mg | Freq: Four times a day (QID) | INTRAMUSCULAR | Status: DC | PRN

## 2019-12-11 MED ORDER — DOXYCYCLINE HYCLATE 50 MG PO CAPS
50.0000 mg | ORAL_CAPSULE | Freq: Two times a day (BID) | ORAL | Status: DC | PRN
  Filled 2019-12-11: qty 1

## 2019-12-11 MED ORDER — PROPOFOL 10 MG/ML IV BOLUS
INTRAVENOUS | Status: AC
  Filled 2019-12-11: qty 20

## 2019-12-11 MED ORDER — BUPIVACAINE LIPOSOME 1.3 % IJ SUSP
INTRAMUSCULAR | Status: AC
  Filled 2019-12-11: qty 20

## 2019-12-11 MED ORDER — METHOCARBAMOL 1000 MG/10ML IJ SOLN
500.0000 mg | Freq: Four times a day (QID) | INTRAVENOUS | Status: DC | PRN
  Filled 2019-12-11: qty 5

## 2019-12-11 MED ORDER — MORPHINE SULFATE 10 MG/ML IJ SOLN
INTRAMUSCULAR | Status: DC | PRN
  Administered 2019-12-11: 10 mg

## 2019-12-11 MED ORDER — PHENYLEPHRINE HCL-NACL 10-0.9 MG/250ML-% IV SOLN
INTRAVENOUS | Status: DC | PRN
  Administered 2019-12-11: 25 ug/min via INTRAVENOUS

## 2019-12-11 MED ORDER — ALPRAZOLAM 0.25 MG PO TABS
0.2500 mg | ORAL_TABLET | Freq: Every evening | ORAL | Status: DC | PRN
  Administered 2019-12-11 – 2019-12-13 (×3): 0.5 mg via ORAL
  Filled 2019-12-11 (×3): qty 2

## 2019-12-11 SURGICAL SUPPLY — 75 items
APL PRP STRL LF DISP 70% ISPRP (MISCELLANEOUS) ×2
BLADE SAGITTAL 25.0X1.19X90 (BLADE) ×2 IMPLANT
BLADE SAGITTAL 25.0X1.19X90MM (BLADE) ×1
BLOCK CUTTING FEMUR 3 LT MED (MISCELLANEOUS) ×2 IMPLANT
BLOCK CUTTING FEMUR 4 LT MED (MISCELLANEOUS) ×2 IMPLANT
BLOCK CUTTING TIBIAL 4 MEDAC (MISCELLANEOUS) ×2 IMPLANT
BNDG ELASTIC 6X5.8 VLCR STR LF (GAUZE/BANDAGES/DRESSINGS) ×3 IMPLANT
CANISTER SUCT 1200ML W/VALVE (MISCELLANEOUS) ×3 IMPLANT
CANISTER SUCT 3000ML PPV (MISCELLANEOUS) ×6 IMPLANT
CANISTER WOUND CARE 500ML ATS (WOUND CARE) ×3 IMPLANT
CEMENT HV SMART SET (Cement) ×6 IMPLANT
CEMENT PATELLA RESURF SZ1 (Cement) ×2 IMPLANT
CHLORAPREP W/TINT 26 (MISCELLANEOUS) ×6 IMPLANT
COOLER POLAR GLACIER W/PUMP (MISCELLANEOUS) ×3 IMPLANT
COVER WAND RF STERILE (DRAPES) ×3 IMPLANT
CUFF TOURN SGL QUICK 24 (TOURNIQUET CUFF)
CUFF TOURN SGL QUICK 30 (TOURNIQUET CUFF)
CUFF TRNQT CYL 24X4X16.5-23 (TOURNIQUET CUFF) IMPLANT
CUFF TRNQT CYL 30X4X21-28X (TOURNIQUET CUFF) IMPLANT
DRAPE 3/4 80X56 (DRAPES) ×6 IMPLANT
ELECT CAUTERY BLADE 6.4 (BLADE) ×3 IMPLANT
ELECT REM PT RETURN 9FT ADLT (ELECTROSURGICAL) ×3
ELECTRODE REM PT RTRN 9FT ADLT (ELECTROSURGICAL) ×1 IMPLANT
FEMORAL COMP SZ4 LEFT SPHERE (Femur) ×2 IMPLANT
FEMUR BONE MODEL 4.9010 MEDACT (MISCELLANEOUS) ×2 IMPLANT
GAUZE SPONGE 4X4 12PLY STRL (GAUZE/BANDAGES/DRESSINGS) ×3 IMPLANT
GAUZE XEROFORM 1X8 LF (GAUZE/BANDAGES/DRESSINGS) ×3 IMPLANT
GLOVE BIOGEL PI IND STRL 9 (GLOVE) ×1 IMPLANT
GLOVE BIOGEL PI INDICATOR 9 (GLOVE) ×2
GLOVE INDICATOR 8.0 STRL GRN (GLOVE) ×3 IMPLANT
GLOVE SURG ORTHO 8.0 STRL STRW (GLOVE) ×3 IMPLANT
GLOVE SURG SYN 9.0  PF PI (GLOVE) ×3
GLOVE SURG SYN 9.0 PF PI (GLOVE) ×1 IMPLANT
GOWN SRG 2XL LVL 4 RGLN SLV (GOWNS) ×1 IMPLANT
GOWN STRL NON-REIN 2XL LVL4 (GOWNS) ×3
GOWN STRL REUS W/ TWL LRG LVL3 (GOWN DISPOSABLE) ×1 IMPLANT
GOWN STRL REUS W/ TWL XL LVL3 (GOWN DISPOSABLE) ×1 IMPLANT
GOWN STRL REUS W/TWL LRG LVL3 (GOWN DISPOSABLE) ×3
GOWN STRL REUS W/TWL XL LVL3 (GOWN DISPOSABLE) ×3
HOLDER FOLEY CATH W/STRAP (MISCELLANEOUS) ×3 IMPLANT
HOOD PEEL AWAY FLYTE STAYCOOL (MISCELLANEOUS) ×6 IMPLANT
INSERT TIBIAL SZ 4 12MM LEFT (Insert) ×2 IMPLANT
KIT PREVENA INCISION MGT20CM45 (CANNISTER) ×3 IMPLANT
KIT TURNOVER KIT A (KITS) ×3 IMPLANT
NDL SAFETY ECLIPSE 18X1.5 (NEEDLE) ×1 IMPLANT
NDL SPNL 18GX3.5 QUINCKE PK (NEEDLE) ×1 IMPLANT
NDL SPNL 20GX3.5 QUINCKE YW (NEEDLE) ×1 IMPLANT
NEEDLE HYPO 18GX1.5 SHARP (NEEDLE) ×3
NEEDLE SPNL 18GX3.5 QUINCKE PK (NEEDLE) ×3 IMPLANT
NEEDLE SPNL 20GX3.5 QUINCKE YW (NEEDLE) ×3 IMPLANT
NS IRRIG 1000ML POUR BTL (IV SOLUTION) ×3 IMPLANT
PACK TOTAL KNEE (MISCELLANEOUS) ×3 IMPLANT
PAD WRAPON POLAR KNEE (MISCELLANEOUS) ×1 IMPLANT
PENCIL SMOKE EVACUATOR COATED (MISCELLANEOUS) ×3 IMPLANT
PULSAVAC PLUS IRRIG FAN TIP (DISPOSABLE) ×3
SCALPEL PROTECTED #10 DISP (BLADE) ×6 IMPLANT
SOL .9 NS 3000ML IRR  AL (IV SOLUTION) ×3
SOL .9 NS 3000ML IRR AL (IV SOLUTION) ×1
SOL .9 NS 3000ML IRR UROMATIC (IV SOLUTION) ×1 IMPLANT
STAPLER SKIN PROX 35W (STAPLE) ×3 IMPLANT
STEM EXTENSION 11MMX30MM (Stem) ×2 IMPLANT
SUCTION FRAZIER HANDLE 10FR (MISCELLANEOUS) ×3
SUCTION TUBE FRAZIER 10FR DISP (MISCELLANEOUS) ×1 IMPLANT
SUT DVC 2 QUILL PDO  T11 36X36 (SUTURE) ×3
SUT DVC 2 QUILL PDO T11 36X36 (SUTURE) ×1 IMPLANT
SUT ETHIBOND 2 V 37 (SUTURE) IMPLANT
SUT V-LOC 90 ABS DVC 3-0 CL (SUTURE) ×3 IMPLANT
SYR 20ML LL LF (SYRINGE) ×3 IMPLANT
SYR 50ML LL SCALE MARK (SYRINGE) ×6 IMPLANT
TIP FAN IRRIG PULSAVAC PLUS (DISPOSABLE) ×1 IMPLANT
TOWEL OR 17X26 4PK STRL BLUE (TOWEL DISPOSABLE) ×3 IMPLANT
TOWER CARTRIDGE SMART MIX (DISPOSABLE) ×3 IMPLANT
TRAY FOLEY MTR SLVR 16FR STAT (SET/KITS/TRAYS/PACK) ×3 IMPLANT
TRAY TIBIAL FIXED T3I4 LEFT (Miscellaneous) ×2 IMPLANT
WRAPON POLAR PAD KNEE (MISCELLANEOUS) ×3

## 2019-12-11 NOTE — H&P (Signed)
Progress Notes - documented in this encounter Table of Contents for Progress Notes  Feliberto Gottron, [place] - 11/14/2019 2:15 PM EST  Dalia Heading, CMA - 11/14/2019 2:15 PM EST    Feliberto Gottron, PA - 11/14/2019 2:15 PM EST Formatting of this note might be different from the original.  Chief Complaint  Patient presents with  . Left Knee - Pre-op Exam   History of the Present Illness: Michelle Jordan is a 64 y.o. female here for history and physical for left total knee arthroplasty with Dr. Hessie Knows on 11/20/2019. Patient has had greater than 5 years of left knee pain, x-ray shows severe degenerative changes with tricompartmental osteoarthritis with varus deformity. She is had no relief with cortisone injections, Synvisc injections, physical therapy, bracing. She takes occasional Tylenol, Aleve and 1-2 tramadol a week as well as occasional oxycodone if pain is severe. She denies any trauma or injury. Recently she has been ambulating with a cane. She has had a successful right total knee arthroplasty. She has seen Dr. Truitt Leep, agreed and consented to a left total knee arthroplasty.  The is employed at [employer] and words from home at the present time.   The patient takes methotrexate.   I have reviewed past medical, surgical, social and family history, and allergies as documented in the EMR.  Past Medical History: Past Medical History:  Diagnosis Date  . Diverticulosis 09/10/2015  . History of carpal tunnel release 04/21/2014  . History of migraine headaches 04/21/2014  . Hyperlipidemia  . Hypothyroidism  She stated this was diagnosed in l978, she was previously on Synthroid, however, has not been on this medication for years.  . IBS (irritable bowel syndrome)  . Iritis  . Migraine-cluster headache syndrome  . Obesity  . Osteoarthritis  a. Knees, b. Cervical spine  . Psoriasis  . Psoriatic arthritis (CMS-HCC)  a. Methotrexate  . Right lateral epicondylitis  04/21/2014  . Rosacea  . S/P removal of ovarian cyst 04/21/2014  Right  . Shingles  . Stress fracture of right foot 04/21/2014   Past Surgical History: Past Surgical History:  Procedure Laterality Date  . bunionectomy/non union jones Left 09/2014  . Carpal tunnel syndrome Bilateral  . Cervical disc surgery 04-14-2004  C-5, C-6  . CHOLECYSTECTOMY 1984  . COLONOSCOPY 09/10/2015  Diverticulosis/Repeat 76yrs/MUS  . History of gastric partition  for weight loss  . HYSTERECTOMY  . OOPHORECTOMY Right  . Removal of ovarian cyst Right  . total knee arthroplasty Right 09/22/2015   Past Family History: Family History  Problem Relation Age of Onset  . Diabetes type II Mother  . High blood pressure (Hypertension) Mother  . Rheum arthritis Mother  . High blood pressure (Hypertension) Father  . Myocardial Infarction (Heart attack) Father  . Esophageal cancer Maternal Aunt   Medications: Current Outpatient Medications Ordered in Epic  Medication Sig Dispense Refill  . ALPRAZolam (NIRAVAM) 0.25 MG dissolvable tablet Take 0.25 mg by mouth nightly as needed for Anxiety  . calcium carbonate 600 mg (1,500 mg) Tab tablet Take by mouth.  . clobetasol (TEMOVATE) 0.05 % ointment Apply topically twice a week 1 liberally to affected area twice a week 30 g 2  . folic acid (FOLVITE) 1 MG tablet TAKE 1 TABLET BY MOUTH ONCE DAILY 90 tablet 3  . meloxicam (MOBIC) 15 MG tablet Take 1 tablet (15 mg total) by mouth once daily 90 tablet 3  . methotrexate (RHEUMATREX) 2.5 MG tablet Take 6 tablets (15 mg  total) by mouth every 7 (seven) days 78 tablet 1  . promethazine (PHENERGAN) 25 MG tablet TAKE 1 TABLET BY MOUTH ONCE DAILY AS NEEDED FOR NAUSEA. 30 tablet 3  . traMADoL (ULTRAM) 50 mg tablet Take 1 tablet (50 mg total) by mouth 2 (two) times daily as needed for Pain 30 tablet 0   No current Epic-ordered facility-administered medications on file.   Allergies: Allergies  Allergen Reactions  . Etodolac Other  (See Comments)  Difficulty moving/walking  . Isometh-Dichloral-Acetaminophn Other (See Comments)  tingled REACTION: caused tingling  . Penicillin Other (See Comments)  Can only take sulfamethoxazole TMP DS tablet due to being on Methotrexate per RA physician  . Spironolactone Other (See Comments)  REACTION: gout  . Statins-Hmg-Coa Reductase Inhibitors Other (See Comments)  . Amitriptyline Other (See Comments) and Anxiety  Mood swings moodiness    Body mass index is 55.79 kg/m.  Review of Systems: A comprehensive 14 point ROS was performed, reviewed, and the pertinent orthopaedic findings are documented in the HPI.  Vitals:  11/14/19 1430  BP: 128/70   General Physical Examination:  General:  Well developed, well nourished, no apparent distress, normal affect antalgic gait with a cane.  HEENT: Head normocephalic, atraumatic, PERRL.   Abdomen: Soft, non tender, non distended, Bowel sounds present.  Heart: Examination of the heart reveals regular, rate, and rhythm. There is no murmur noted on ascultation. There is a normal apical pulse.  Lungs: Lungs are clear to auscultation. There is no wheeze, rhonchi, or crackles. There is normal expansion of bilateral chest walls.   Musculoskeletal Examination: She has left knee range of motion of 5 to 95 degrees. Left knee varus deformity without crepitus. Able to straight leg raise. Mild bilateral chronic lymphedema both lower extremities. Negative Homans' sign bilaterally.  Radiographs: She has had a MyKnee CT done. She has severe medial and patellofemoral changes on prior x-rays.   X-rays of the left knee reviewed by me from 05/01/2019 show severe degenerative changes in the medial compartment with tricompartmental osteoarthritis. Complete loss of joint space medial compartment.  Assessment: ICD-10-CM  1. Primary localized osteoarthritis of left knee M17.12   Plan: 1. Risks, benefits, complications of a left total knee  arthroplasty have been discussed with the patient. Patient has agreed and consented to the procedure with Dr. Hessie Knows on 11/20/2019.    Electronically signed by Feliberto Gottron, PA at 11/17/2019 8:23 AM EST   Reviewed paper H+P, will be scanned into chart. No changes noted.

## 2019-12-11 NOTE — Progress Notes (Signed)
PT Cancellation Note  Patient Details Name: Michelle Jordan MRN: [id number] DOB: 1956/06/09   Cancelled Treatment:    Reason Eval/Treat Not Completed: Other (comment). PT entered room, pt stated "you cannot get me up right now" that her pain 8-9/10 in her R knee, RN aware. PT assisted to reposition pt. PT to re-attempt as able.    Lieutenant Diego PT, DPT 2:51 PM,12/11/19

## 2019-12-11 NOTE — Anesthesia Procedure Notes (Addendum)
Spinal  Patient location during procedure: OR Start time: 12/11/2019 7:25 AM End time: 12/11/2019 7:38 AM Staffing Performed: resident/CRNA  Resident/CRNA: Nelda Marseille, CRNA Preanesthetic Checklist Completed: patient identified, IV checked, site marked, risks and benefits discussed, surgical consent, monitors and equipment checked, pre-op evaluation and timeout performed Spinal Block Patient position: sitting Prep: Betadine Patient monitoring: heart rate, continuous pulse ox, blood pressure and cardiac monitor Approach: right paramedian Location: L3-4 Injection technique: single-shot Needle Needle type: Whitacre and Introducer  Needle gauge: 25 G Needle length: 9 cm Assessment Sensory level: T10 Additional Notes Negative paresthesia. Negative blood return. Positive free-flowing CSF. Expiration date of kit checked and confirmed. Patient tolerated procedure well, without complications.

## 2019-12-11 NOTE — Op Note (Signed)
12/11/2019  9:58 AM  PATIENT:  Michelle Jordan  64 y.o. female  PRE-OPERATIVE DIAGNOSIS:  PRIMARY OSTOEARTHRITIS OF LEFT KNEE  POST-OPERATIVE DIAGNOSIS:  PRIMARY OSTOEARTHRITIS OF LEFT KNEE  PROCEDURE:  Procedure(s): LEFT TOTAL KNEE ARTHROPLASTY (Left)  SURGEON: Laurene Footman, MD  ASSISTANTS: Rachelle Hora, PA-C  ANESTHESIA:   spinal  EBL:  Total I/O In: 1000 [I.V.:1000] Out: 250 [Urine:150; Blood:100]  BLOOD ADMINISTERED:none  DRAINS: Incisional wound VAC   LOCAL MEDICATIONS USED:  MARCAINE    and OTHER Exparel morphine  SPECIMEN:  No Specimen  DISPOSITION OF SPECIMEN:  N/A  COUNTS:  YES  TOURNIQUET:   Total Tourniquet Time Documented: Thigh (Left) - 82 minutes Total: Thigh (Left) - 82 minutes   IMPLANTS: Medacta GMK sphere system with a 4 left femur, 3T/4I  tibial baseplate with short stem and 12 mm insert, size 1 patella, all components cemented  DICTATION: .Dragon Dictation    patient was brought to the operating room and spinal anesthesia was obtained. After prepping and draping the  left leg in sterile fashion, andafter patient identification and timeout procedures were completed,tourniquet was raised andmidline skin incision was made followed by medial parapatellar arthrotomywith bone-on-bone medial compartment osteoarthritis advanced patellofemoral arthritis andseverelateral compartment arthritis,partial synovectomy was also carried out. The ACL and fat pad were excised off the anterior into the meniscus in the proximal tibia cutting guide from the [facility] was appliedand theproximal tibia cut carried out. The distal femoral cut was carried out in a similar fashion there is a slight flexion contracture and varus deformity and after the initial cuts there was an adequate space for the space gauge..  The13femoralcutting guide applied with anterior posterior and chamfer cuts made. Large posterior osteophytes were removed at this time with the aid  of a angled osteotome and curette the posterior horns of the menisci were removed at this point.Injection of the above medication was carried out after the femoral and tibial cuts were carried out. The3tibia/4 insert baseplate trial was placed pinned into position and proximal tibial preparation carried out with drilling hand reaming and the keel punch followed by placement of the21femur and sizing the tibial insert size12 millimetergave the best fit with stability and full extension. The distal femoral drill holes were made in the notch cut for the trochlear groove was then carried out with trials were then removed the patella was cut using the patellar cutting guide and it sized to a size1afterdrill holes have been made The knee was irrigated with pulsatile lavage and the bony surfaces dried the tibial component was cemented into place first. Excess cement was removed and the polyethylene insert placed with a torque screw placed with a torque screwdriver tightened. The distal femoral component was placed and the knee was held in extension as the patellar button was clamped into place. After the cement was set,excess cement was removed and the knee was again irrigated thoroughly thoroughly irrigated. The tourniquet was let down and hemostasis checkedwithelectrocautery.The arthrotomy was repaired witha heavy Quill suture, followed by 3-0 V lock subcuticular closure,skin staplesfollowed byincisional wound VAC and Polar Care.Marland Kitchen  PLAN OF CARE: Admit for overnight observation  PATIENT DISPOSITION:  PACU - hemodynamically stable.

## 2019-12-11 NOTE — TOC Initial Note (Signed)
Transition of Care [facility]) - Initial/Assessment Note    Patient Details  Name: Michelle Jordan MRN: [id number] Date of Birth: 11/22/1955  [facility] of [facility]) CM/SW Contact:    Elease Hashimoto, LCSW Phone Number: 12/11/2019, 2:34 PM  Clinical Narrative:   Met with pt who reports this is her second knee replacement. She had her right one done last year. She has equipment from that one, felt 3 in 1 not needed. She did have [facility] but not able to take this time due to out of network. Will try to find another home health agency to take, otherwise may need to go to OP. Pt's significant other-Michelle Jordan is supportive and able to assist if needed. Pt hopes she will be mobile with a walker by DC. Will continue to follow and assist with discharge needs.              Expected Discharge Plan: [place] Barriers to Discharge: Continued Medical Work up   Patient Goals and CMS Choice Patient states their goals for this hospitalization and ongoing recovery are:: I have been though this before and I hope I do as well this time      Expected Discharge Plan and Services Expected Discharge Plan: [place] In-house Referral: Clinical Social Work   Post Acute Care Choice: [place] arrangements for the past 2 months: [place]                                      Prior Living Arrangements/Services Living arrangements for the past 2 months: Single Family Home Lives with:: Significant Other(Michelle Jordan)          Need for Family Participation in Patient Care: No (Comment) Care giver support system in place?: No (comment) Current home services: DME(has rw, rollator and had [facility] did not use got rid of)    Activities of Daily Living   ADL Screening (condition at time of admission) Patient's cognitive ability adequate to safely complete daily activities?: Yes Independently performs ADLs?: Yes (appropriate for developmental age)  Permission  Sought/Granted   Permission granted to share information with : Yes, Verbal Permission Granted  Share Information with NAME: Synetta Shadow     Permission granted to share info w Relationship: Significant other     Emotional Assessment Appearance:: Appears stated age Attitude/Demeanor/Rapport: Gracious Affect (typically observed): Adaptable, Accepting Orientation: : Oriented to Self, Oriented to Place, Oriented to  Time, Oriented to Situation Alcohol / Substance Use: Never Used Psych Involvement: No (comment)  Admission diagnosis:  Knee joint replacement status, left [Z96.652] Patient Active Problem List   Diagnosis Date Noted  . Knee joint replacement status, left 12/11/2019  . S/P total knee replacement 09/22/2015  . Preoperative examination 09/28/2014  . Foot fracture, left 08/18/2014  . Migraine 08/18/2014  . Lipoma 07/28/2011  . Anxiety 07/28/2011  . GOUT, UNSPECIFIED 03/09/2010  . Pain in limb 07/13/2008  . Hyperlipidemia 02/12/2007  . OBESITY NOS 02/12/2007  . IRITIS 02/12/2007  . Essential hypertension 02/12/2007  . DIVERTICULOSIS 02/12/2007  . IBS 02/12/2007  . ROSACEA 02/12/2007  . PSORIASIS NEC 02/12/2007  . OSTEOARTHROSIS NOS, UNSPECIFIED SITE 02/12/2007   PCP:  Tracie Harrier, MD Pharmacy:   [place], [place] - [address] [place] [address] Phone: [phone] Fax: [phone]     Social  Determinants of Health (SDOH) Interventions    Readmission Risk Interventions No flowsheet data found.  

## 2019-12-11 NOTE — Transfer of Care (Signed)
Immediate Anesthesia Transfer of Care Note  Patient: Michelle Jordan  Procedure(s) Performed: LEFT TOTAL KNEE ARTHROPLASTY (Left Knee)  Patient Location: PACU  Anesthesia Type:General and Spinal  Level of Consciousness: awake, alert  and oriented  Airway & Oxygen Therapy: Patient Spontanous Breathing and Patient connected to face mask oxygen  Post-op Assessment: Report given to RN and Post -op Vital signs reviewed and stable  Post vital signs: Reviewed  Last Vitals:  Vitals Value Taken Time  BP 105/66 12/11/19 1001  Temp 36.3 C 12/11/19 1001  Pulse 83 12/11/19 1003  Resp 12 12/11/19 1003  SpO2 100 % 12/11/19 1003  Vitals shown include unvalidated device data.  Last Pain:  Vitals:   12/11/19 [id number]  TempSrc: Tympanic  PainSc: 2          Complications: No apparent anesthesia complications

## 2019-12-11 NOTE — Anesthesia Procedure Notes (Signed)
Date/Time: 12/11/2019 7:46 AM Performed by: Nelda Marseille, CRNA Pre-anesthesia Checklist: Patient identified, Emergency Drugs available, Suction available, Patient being monitored and Timeout performed Oxygen Delivery Method: Simple face mask

## 2019-12-11 NOTE — Anesthesia Preprocedure Evaluation (Signed)
Anesthesia Evaluation  Patient identified by MRN, date of birth, ID band Patient awake    Reviewed: Allergy & Precautions, H&P , NPO status , Patient's Chart, lab work & pertinent test results  History of Anesthesia Complications Negative for: history of anesthetic complications  Airway Mallampati: II  TM Distance: >3 FB Neck ROM: full    Dental  (+) Chipped   Pulmonary neg pulmonary ROS,           Cardiovascular Exercise Tolerance: Good hypertension,      Neuro/Psych  Headaches, Anxiety    GI/Hepatic negative GI ROS,   Endo/Other  Hypothyroidism Morbid obesity  Renal/GU   negative genitourinary   Musculoskeletal   Abdominal   Peds  Hematology negative hematology ROS (+)   Anesthesia Other Findings Past Medical History: No date: AC (acromioclavicular) joint bone spurs     Comment:  feet No date: Anxiety No date: Complication of anesthesia No date: Diverticulosis of colon (without mention of hemorrhage) No date: Epicondylitis, lateral     Comment:  right No date: Gout, unspecified No date: Headache No date: History of shingles No date: Hypothyroidism     Comment:  per pt, was dx in 1978, previosly on Synthroid, but has               not taken it in years No date: Irritable bowel syndrome No date: Migraine headache No date: Migraine-cluster headache syndrome No date: Morbid obesity ([place]) No date: Obesity, unspecified No date: Osteoarthrosis, unspecified whether generalized or localized,  unspecified site     Comment:  knees, cervical spine No date: Other abnormal glucose No date: Other and unspecified hyperlipidemia No date: Other psoriasis No date: Plantar fasciitis No date: PONV (postoperative nausea and vomiting)     Comment:  nausea only with foot surgery in Jan. 2016, otherwise no              nausea No date: Psoriatic arthritis mutilans (Needles)     Comment:  methotrexate No date: Rosacea No  date: Shingles No date: Stress fracture of right foot No date: TMJ (temporomandibular joint disorder) No date: Unspecified essential hypertension  Past Surgical History: No date: ABDOMINAL HYSTERECTOMY 2005: c-s surgery No date: CARPAL TUNNEL RELEASE     Comment:  bilateral No date: CERVICAL DISC SURGERY     Comment:  C5, C6 No date: CHOLECYSTECTOMY 09/10/2015: COLONOSCOPY; N/A     Comment:  Procedure: COLONOSCOPY;  Surgeon: Lollie Sails, MD;              Location: [facility] ENDOSCOPY;  Service: Endoscopy;                Laterality: N/A; No date: ESOPHAGOGASTRODUODENOSCOPY     Comment:  had esophageal stricture and swallowing study 09/2014: FOOT FRACTURE SURGERY; Left     Comment:  and bunionectomy 11/06: LAPAROSCOPIC SUPRACERVICAL HYSTERECTOMY     Comment:  Fibroid tumor and ovarian cyst (one ovary and cervix               left) 2006: NECK SURGERY 2004: OOPHORECTOMY     Comment:  1 ovary removed No date: OTHER SURGICAL HISTORY     Comment:  history of gastric partition for weight loss No date: OVARIAN CYST REMOVAL years ago: STOMACH SURGERY     Comment:  for weight loss--ruptured incision 09/22/2015: TOTAL KNEE ARTHROPLASTY; Right     Comment:  Procedure: TOTAL KNEE ARTHROPLASTY;  Surgeon: Joneen Boers  Jefm Bryant, MD;  Location: [facility] ORS;  Service: Orthopedics;              Laterality: Right;  BMI    Body Mass Index: 53.90 kg/m      Reproductive/Obstetrics (+) Pregnancy                            Anesthesia Physical Anesthesia Plan  ASA: III  Anesthesia Plan: Spinal   Post-op Pain Management:    Induction:   PONV Risk Score and Plan: Propofol infusion  Airway Management Planned: Natural Airway and Simple Face Mask  Additional Equipment:   Intra-op Plan:   Post-operative Plan:   Informed Consent: I have reviewed the patients History and Physical, chart, labs and discussed the procedure including the risks, benefits and  alternatives for the proposed anesthesia with the patient or authorized representative who has indicated his/her understanding and acceptance.     Dental Advisory Given  Plan Discussed with: Anesthesiologist  Anesthesia Plan Comments:        Anesthesia Quick Evaluation

## 2019-12-12 DIAGNOSIS — R000 Tachycardia, unspecified: Secondary | ICD-10-CM | POA: Diagnosis not present

## 2019-12-12 LAB — URINALYSIS, COMPLETE (UACMP) WITH MICROSCOPIC
Bacteria, UA: NONE SEEN
Bilirubin Urine: NEGATIVE
Glucose, UA: NEGATIVE mg/dL
Ketones, ur: NEGATIVE mg/dL
Leukocytes,Ua: NEGATIVE
Nitrite: NEGATIVE
Protein, ur: 100 mg/dL — AB
RBC / HPF: >50 RBC/hpf — ABNORMAL HIGH (ref 0–5)
Specific Gravity, Urine: 1.033 — ABNORMAL HIGH (ref 1.005–1.030)
WBC, UA: >50 WBC/hpf — ABNORMAL HIGH (ref 0–5)
pH: 5 (ref 5.0–8.0)

## 2019-12-12 LAB — BASIC METABOLIC PANEL
Anion gap: 7 (ref 5–15)
BUN: 15 mg/dL (ref 8–23)
CO2: 24 mmol/L (ref 22–32)
Calcium: 7.9 mg/dL — ABNORMAL LOW (ref 8.9–10.3)
Chloride: 107 mmol/L (ref 98–111)
Creatinine, Ser: 0.62 mg/dL (ref 0.44–1.00)
GFR calc Af Amer: >60 mL/min (ref >60)
GFR calc non Af Amer: >60 mL/min (ref >60)
Glucose, Bld: 147 mg/dL — ABNORMAL HIGH (ref 70–99)
Potassium: 4.2 mmol/L (ref 3.5–5.1)
Sodium: 138 mmol/L (ref 135–145)

## 2019-12-12 LAB — CBC
HCT: 35.6 % — ABNORMAL LOW (ref 36.0–46.0)
Hemoglobin: 11.1 g/dL — ABNORMAL LOW (ref 12.0–15.0)
MCH: 28.8 pg (ref 26.0–34.0)
MCHC: 31.2 g/dL (ref 30.0–36.0)
MCV: 92.2 fL (ref 80.0–100.0)
Platelets: 207 10*3/uL (ref 150–400)
RBC: 3.86 MIL/uL — ABNORMAL LOW (ref 3.87–5.11)
RDW: 13.9 % (ref 11.5–15.5)
WBC: 7.9 10*3/uL (ref 4.0–10.5)
nRBC: 0 % (ref 0.0–0.2)

## 2019-12-12 LAB — TSH: TSH: 1.348 u[IU]/mL (ref 0.350–4.500)

## 2019-12-12 MED ORDER — AMLODIPINE BESYLATE 5 MG PO TABS
5.0000 mg | ORAL_TABLET | Freq: Every day | ORAL | Status: DC
  Administered 2019-12-13: 08:00:00 5 mg via ORAL
  Filled 2019-12-12: qty 1

## 2019-12-12 MED ORDER — CHLORHEXIDINE GLUCONATE CLOTH 2 % EX PADS
6.0000 | MEDICATED_PAD | Freq: Every day | CUTANEOUS | Status: DC
  Administered 2019-12-13: 08:00:00 6 via TOPICAL

## 2019-12-12 MED ORDER — OXYCODONE HCL ER 15 MG PO T12A
15.0000 mg | EXTENDED_RELEASE_TABLET | Freq: Two times a day (BID) | ORAL | Status: DC
  Administered 2019-12-12 – 2019-12-14 (×4): 15 mg via ORAL
  Filled 2019-12-12 (×4): qty 1

## 2019-12-12 MED ORDER — MORPHINE SULFATE (PF) 2 MG/ML IV SOLN: 2.0000 mg | Freq: Once | INTRAVENOUS | Status: DC

## 2019-12-12 NOTE — Anesthesia Postprocedure Evaluation (Signed)
Anesthesia Post Note  Patient: Manufacturing engineer  Procedure(s) Performed: LEFT TOTAL KNEE ARTHROPLASTY (Left Knee)  Patient location during evaluation: Nursing Unit Anesthesia Type: Spinal Level of consciousness: oriented and awake and alert Pain management: pain level controlled Vital Signs Assessment: post-procedure vital signs reviewed and stable Respiratory status: spontaneous breathing and respiratory function stable Cardiovascular status: blood pressure returned to baseline and stable Postop Assessment: no headache, no backache, no apparent nausea or vomiting and patient able to bend at knees Anesthetic complications: no     Last Vitals:  Vitals:   12/12/19 0739 12/12/19 0947  BP: (!) 145/77 (!) 176/75  Pulse: (!) 120 (!) 118  Resp: 17 17  Temp: 37.4 C 37.1 C  SpO2: 97% 92%    Last Pain:  Vitals:   12/12/19 0947  TempSrc: Oral  PainSc:                  Jerrye Noble

## 2019-12-12 NOTE — Evaluation (Signed)
Occupational Therapy Evaluation Patient Details Name: Michelle Jordan MRN: [id number] DOB: [birth year] Today's Date: 12/12/2019    History of Present Illness pt is a 64 yo female s/p L TKR on 2/18, PMH includes obesity, R TKR in 2016, OA, obesity, HTN, HLD   Clinical Impression   Pt seen for OT evaluation this date, POD#1 from above surgery. Pt was independent in all ADLs prior to surgery, however occasionally using SPC for fxl mobility and shower chair for bathing d/t L knee pain. Pt is eager to return to PLOF with less pain and improved safety and independence. Pt currently requires MAX A for LB dressing while at bed level due to limited sitting tolerance 2/2 pain in L LE and limited AROM of L knee. Pt instructed in polar care mgt, falls prevention strategies, home/routines modifications, DME/AE for LB bathing and dressing tasks, and compression stocking mgt. Assessment of mobility somewhat limited due to pain this date with OT only assessing bed mobility for sup<>sit transition. Pt would benefit from skilled OT services including additional instruction in dressing techniques with or without assistive devices for dressing and bathing skills to support recall and carryover prior to discharge and ultimately to maximize safety, independence, and minimize falls risk and caregiver burden. Anticipate pt will require HHOT following this hospitalization.      Follow Up Recommendations  Home health OT;Supervision/Assistance - 24 hour    Equipment Recommendations  Other (pt reports having all needed AD/DME. Recommend AE for LB ADLs (sock aide, reacher, LH shoe horn, LH sponge=hip kit)    Recommendations for Other Services       Precautions / Restrictions Precautions Precautions: Knee Restrictions Weight Bearing Restrictions: Yes LLE Weight Bearing: Weight bearing as tolerated      Mobility Bed Mobility Overal bed mobility: Needs Assistance Bed Mobility: Supine to Sit     Supine to sit: Min  assist;HOB elevated     General bed mobility comments: use of leg lifter strap to mobilize L LE toward EOB.  Transfers Sit to Stand: Per PT note-Min assist;From elevated surface         General transfer comment: transfer not completed on OT evaluation despite encouragement d/t pt with c/o pain too bad to attempt to stand.    Balance Overall balance assessment: Needs assistance Sitting-balance support: Feet supported Sitting balance-Leahy Scale: Normal         Standing balance comment: unable to assess on OT evaluation                           ADL either performed or assessed with clinical judgement   ADL                                         General ADL Comments: Pt able to perform bed level UB ADLs like feeding and grooming with setup/Indep. Limited tolerance for EOB sitting on evaluation d/t pain levels. Pt would likely be able to complete at same level in EOB sitting with pain control. Pt requires MAX A with LB ADLs including donning sock to non-op R LE at bed level at this time, secondary to pain levels.     Vision Baseline Vision/History: Wears glasses Wears Glasses: Distance only(only wears occasionally) Patient Visual Report: No change from baseline       Perception     Praxis  Pertinent Vitals/Pain Pain Assessment: 0-10 Pain Score: 8  Pain Location: L knee Pain Descriptors / Indicators: Sore;Grimacing;Operative site guarding;Sharp;Moaning Pain Intervention(s): Limited activity within patient's tolerance;Monitored during session;Premedicated before session     Hand Dominance     Extremity/Trunk Assessment Upper Extremity Assessment Upper Extremity Assessment: Overall WFL for tasks assessed;RUE deficits/detail(elbow and grip grossly 4/5 bilaterally) RUE Deficits / Details: hx frozen shoulder on R, some limited shoulder ROM with abduction, but WFL in other planes.   Lower Extremity Assessment Lower Extremity Assessment:  Defer to PT evaluation;LLE deficits/detail LLE Deficits / Details: dec strength, ROM, balance consistent with recent surgery LLE: Unable to fully assess due to pain   Cervical / Trunk Assessment Cervical / Trunk Assessment: Normal   Communication Communication Communication: No difficulties   Cognition Arousal/Alertness: Awake/alert Behavior During Therapy: WFL for tasks assessed/performed Overall Cognitive Status: Within Functional Limits for tasks assessed                                     General Comments  wound vac in place t/o session    Exercises Other Exercises Other Exercises: OT facilitates pt education re: AE for LB ADLs including sock aid, reacher, and LH shoehorn. Pt with MIN/MOD reception, some eye closing/drowsiness on education. Other Exercises: OT facilitates education re: polar care and compression stocking mgt. Pt with MIN/MOD reception of education again, d/t drowsiness.   Shoulder Instructions      Home Living Family/patient expects to be discharged to:: Private residence Living Arrangements: Spouse/significant other Available Help at Discharge: Family;Available 24 hours/day Type of Home: House Home Access: Stairs to enter [employer] of Steps: 3   Home Layout: One level     Bathroom Shower/Tub: Walk-in shower;Door         Home Equipment: Walker - 2 wheels;Walker - 4 wheels;Bedside commode;Shower seat;Cane - single point   Additional Comments: states never used BSC, has shower chair in both showers and removable shower head      Prior Functioning/Environment Level of Independence: Independent with assistive device(s)        Comments: uses SPC for community ambulation/distance, no AD for household ambulation. States she was performing all self care I'ly including after her last surgery.        OT Problem List: Decreased strength;Decreased range of motion;Decreased activity tolerance;Impaired balance (sitting and/or  standing);Decreased knowledge of use of DME or AE;Obesity;Pain      OT Treatment/Interventions: Self-care/ADL training;Therapeutic exercise;DME and/or AE instruction;Therapeutic activities;Patient/family education;Balance training    OT Goals(Current goals can be found in the care plan section) Acute Rehab OT Goals Patient Stated Goal: reduce pain OT Goal Formulation: With patient Time For Goal Achievement: 12/26/19 Potential to Achieve Goals: Good  OT Frequency: Min 2X/week   Barriers to D/C:            Co-evaluation              AM-PAC OT "6 Clicks" Daily Activity     Outcome Measure Help from another person eating meals?: None Help from another person taking care of personal grooming?: A Little Help from another person toileting, which includes using toliet, bedpan, or urinal?: A Lot Help from another person bathing (including washing, rinsing, drying)?: A Lot Help from another person to put on and taking off regular upper body clothing?: A Little Help from another person to put on and taking off regular lower body clothing?: A Lot  6 Click Score: 16   End of Session Nurse Communication: Mobility status  Activity Tolerance: Patient limited by pain Patient left: in bed;with call bell/phone within reach;with bed alarm set  OT Visit Diagnosis: Unsteadiness on feet (R26.81);Pain                Time: 0912-0940 OT Time Calculation (min): 28 min Charges:  OT General Charges $OT Visit: 1 Visit OT Evaluation $OT Eval Moderate Complexity: 1 Mod OT Treatments $Self Care/Home Management : 8-22 mins   Gerrianne Scale, MS, OTR/L ascom [phone] 12/12/19, 2:05 PM

## 2019-12-12 NOTE — Progress Notes (Signed)
Subjective: 1 Day Post-Op Procedure(s) (LRB): LEFT TOTAL KNEE ARTHROPLASTY (Left) Patient reports pain as 8 on 0-10 scale.   Patient is well, and has had no acute complaints or problems Denies any CP, SOB, ABD pain.  No palpitations We will continue therapy today.  Plan is to go Home after hospital stay.  Objective: Vital signs in last 24 hours: Temp:  [97.3 F (36.3 C)-99.3 F (37.4 C)] 99.3 F (37.4 C) (03/19 0739) Pulse Rate:  [75-120] 120 (03/19 0739) Resp:  [12-18] 17 (03/19 0739) BP: (99-145)/(55-84) 145/77 (03/19 0739) SpO2:  [94 %-100 %] 97 % (03/19 0739)  Intake/Output from previous day: 03/18 0701 - 03/19 0700 In: 1445 [P.O.:245; I.V.:1200] Out: 1880 [Urine:1780; Blood:100] Intake/Output this shift: No intake/output data recorded.  Recent Labs    12/11/19 1232 12/12/19 0315  HGB 10.7* 11.1*   Recent Labs    12/11/19 1232 12/12/19 0315  WBC 7.2 7.9  RBC 3.72* 3.86*  HCT 34.0* 35.6*  PLT 203 207   Recent Labs    12/11/19 1232 12/12/19 0315  NA  --  138  K  --  4.2  CL  --  107  CO2  --  24  BUN  --  15  CREATININE 0.50 0.62  GLUCOSE  --  147*  CALCIUM  --  7.9*   No results for input(s): LABPT, INR in the last 72 hours.  EXAM General - Patient is Alert, Appropriate and Oriented  Heart -tachycardic.  Regular rhythm Pulmonary -clear to auscultation bilaterally Extremity - Neurovascular intact Sensation intact distally Intact pulses distally Dorsiflexion/Plantar flexion intact No cellulitis present Compartment soft Dressing - dressing C/D/I and no drainage, Praveena intact without drainage Motor Function - intact, moving foot and toes well on exam.   Past Medical History:  Diagnosis Date  . AC (acromioclavicular) joint bone spurs    feet  . Anxiety   . Complication of anesthesia   . Diverticulosis of colon (without mention of hemorrhage)   . Epicondylitis, lateral    right  . Gout, unspecified   . Headache   . History of  shingles   . Hypothyroidism    per pt, was dx in 1978, previosly on Synthroid, but has not taken it in years  . Irritable bowel syndrome   . Migraine headache   . Migraine-cluster headache syndrome   . Morbid obesity ([place])   . Obesity, unspecified   . Osteoarthrosis, unspecified whether generalized or localized, unspecified site    knees, cervical spine  . Other abnormal glucose   . Other and unspecified hyperlipidemia   . Other psoriasis   . Plantar fasciitis   . PONV (postoperative nausea and vomiting)    nausea only with foot surgery in Jan. 2016, otherwise no nausea  . 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 Rosacea   . Shingles   . Stress fracture of right foot   . TMJ (temporomandibular joint disorder)   . Unspecified essential hypertension     Assessment/Plan:   1 Day Post-Op Procedure(s) (LRB): LEFT TOTAL KNEE ARTHROPLASTY (Left) Active Problems:   Knee joint replacement status, left  Estimated body mass index is 53.9 kg/m as calculated from the following:   Height as of this encounter: 5\' 4"  (1.626 m).   Weight as of this encounter: 142.4 kg. Advance diet Up with therapy  Needs bowel movement Patient tachycardic at 120 with low-grade fever nine 9.3.  Encourage incentive spirometer.  Will order urinalysis check  for UTI.  Urine appears to be dark cloudy and slightly bloody.  Continue with IV fluids.  No chest pain, shortness of breath, palpitations.  Try to get pain under control with routine medications.  We will see how she performs with PT today.  If any low O2 sats and significant elevation in heart rate may consider CTA of the chest Recheck labs in the morning Care management to assist with discharge  DVT Prophylaxis - Lovenox, Foot Pumps and TED hose Weight-Bearing as tolerated to left leg   T. Rachelle Hora, PA-C [place] 12/12/2019, 8:13 AM

## 2019-12-12 NOTE — Progress Notes (Signed)
Provider at bedside to see patient. Discussed elevated bp and hr temp and red urine output. Orders clarified for pain medication as well as to continue current anticoagulation. MEWS score elevated. Pt encouraged to [place] IS and repositioned in the bed, discussed pain management regimen.

## 2019-12-12 NOTE — Consult Note (Signed)
Medical Consultation   Michelle Jordan  [id number]  DOB: [birth year]  DOA: 12/11/2019  PCP: Tracie Harrier, MD (Confirm with patient/family/NH records and if not entered, this has to be entered at [facility] point of entry)  Outpatient Specialists:  Requesting physician: Dr Rudene Christians  Reason for consultation: Tacycardia   History of Present Illness: Michelle Jordan is an 64 y.o. female morbidly obese (BMI 53.9 kg/m2) s/p stent left total knee arthroplasty.  "Consult was requested for tachycardia.  Patient is seen and examined is very tearful and complains of pain in her left knee which she rated 10 x 10 in intensity at its worst.  She states that she has been given pills for pain control but "none of the pills have worked" She has had to participate in physical therapy which she thinks has aggravated the pain.  Patient states that when she had her right knee arthroplasty she had issues with pain control.  She denies having any chest pain, shortness of breath, nausea, vomiting or diaphoresis. She denies heat intolerance, no changes in her appetite, no night sweats or weight loss.     Review of Systems:  ROS As per HPI otherwise 10 point review of systems negative.   Past Medical History: Past Medical History:  Diagnosis Date  . AC (acromioclavicular) joint bone spurs    feet  . Anxiety   . Complication of anesthesia   . Diverticulosis of colon (without mention of hemorrhage)   . Epicondylitis, lateral    right  . Gout, unspecified   . Headache   . History of shingles   . Hypothyroidism    per pt, was dx in 1978, previosly on Synthroid, but has not taken it in years  . Irritable bowel syndrome   . Migraine headache   . Migraine-cluster headache syndrome   . Morbid obesity ([place])   . Obesity, unspecified   . Osteoarthrosis, unspecified whether generalized or localized, unspecified site    knees, cervical spine  . Other abnormal glucose   . Other and unspecified  hyperlipidemia   . Other psoriasis   . Plantar fasciitis   . PONV (postoperative nausea and vomiting)    nausea only with foot surgery in Jan. 2016, otherwise no nausea  . 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 Rosacea   . Shingles   . Stress fracture of right foot   . TMJ (temporomandibular joint disorder)   . Unspecified essential hypertension     Past Surgical History: Past Surgical History:  Procedure Laterality Date  . ABDOMINAL HYSTERECTOMY    . c-s surgery  2005  . CARPAL TUNNEL RELEASE     bilateral  . CERVICAL DISC SURGERY     C5, C6  . CHOLECYSTECTOMY    . COLONOSCOPY N/A 09/10/2015   Procedure: COLONOSCOPY;  Surgeon: Lollie Sails, MD;  Location: [facility] ENDOSCOPY;  Service: Endoscopy;  Laterality: N/A;  . ESOPHAGOGASTRODUODENOSCOPY     had esophageal stricture and swallowing study  . FOOT FRACTURE SURGERY Left 09/2014   and bunionectomy  . LAPAROSCOPIC SUPRACERVICAL HYSTERECTOMY  11/06   Fibroid tumor and ovarian cyst (one ovary and cervix left)  . NECK SURGERY  2006  . OOPHORECTOMY  2004   1 ovary removed  . OTHER SURGICAL HISTORY     history of gastric partition for weight loss  . OVARIAN CYST REMOVAL    . STOMACH SURGERY  years  ago   for weight loss--ruptured incision  . TOTAL KNEE ARTHROPLASTY Right 09/22/2015   Procedure: TOTAL KNEE ARTHROPLASTY;  Surgeon: Leanor Kail, MD;  Location: [facility] ORS;  Service: Orthopedics;  Laterality: Right;  . TOTAL KNEE ARTHROPLASTY Left 12/11/2019   Procedure: LEFT TOTAL KNEE ARTHROPLASTY;  Surgeon: Hessie Knows, MD;  Location: [facility] ORS;  Service: Orthopedics;  Laterality: Left;     Allergies:   Allergies  Allergen Reactions  . Etodolac Other (See Comments)    Patient is unsure (cannot remember) reaction type  . Isometheptene-Dichloral-Apap     ([place])Caused tingling  . Penicillins Other (See Comments)    Did it involve swelling of the face/tongue/throat, SOB, or low BP? No Did it involve  sudden or severe rash/hives, skin peeling, or any reaction on the inside of your mouth or nose? No Did you need to seek medical attention at a hospital or doctor's office? No When did it last happen?DOES NOT HAVE A REACTION TO PENICILLIN (PENICILLIN contraindicated with METHOTREXATE) If all above answers are "NO", may proceed with cephalosporin use. .  . Spironolactone Other (See Comments)    Gout  . Statins Other (See Comments)    Calf/leg pain  . Amitriptyline Anxiety    moodiness     Social History:  reports that she has never smoked. She has never used smokeless tobacco. She reports that she does not drink alcohol or use drugs.   Family History: Family History  Problem Relation Age of Onset  . Arthritis Mother   . Fibromyalgia Mother   . Other Mother        Severe lymphedema  . Obesity Mother   . Obesity Brother   . Heart attack Father   . Coronary artery disease Father   . Ovarian cancer Maternal Aunt   . Breast cancer Paternal Aunt   . Coronary artery disease Paternal Uncle        all paternal uncles  . Stroke Other        GF  . Hypertension Other        a lot of family members  . Diabetes Other        GM  . Diabetes Brother     Unacceptable: Noncontributory, unremarkable, or negative. Acceptable: Family history reviewed and not pertinent (If you reviewed it)   Physical Exam: Vitals:   12/11/19 2339 12/12/19 0739 12/12/19 0947 12/12/19 1141  BP: 134/84 (!) 145/77 (!) 176/75 (!) 153/90  Pulse: (!) 101 (!) 120 (!) 118 (!) 117  Resp: 18 17 17 17   Temp: 98.2 F (36.8 C) 99.3 F (37.4 C) 98.7 F (37.1 C) 98.8 F (37.1 C)  TempSrc:  Oral Oral   SpO2: 94% 97% 92% 99%  Weight:      Height:        Constitutional: Appearance,  Alert and awake, oriented x3, not in any acute distress. Morbidly obese Eyes: PERLA, EOMI, irises appear normal, anicteric sclera,  ENMT: external ears and nose appear normal, normal hearing or hard of hearing            Lips  appears normal, oropharynx mucosa, tongue, posterior pharynx appear normal  Neck: neck appears normal, no masses, normal ROM, no thyromegaly, no JVD  CVS: Tachycardic, no murmur rubs or gallops, no LE edema, normal pedal pulses  Respiratory:  clear to auscultation bilaterally, no wheezing, rales or rhonchi. Respiratory effort normal. No accessory muscle use.  Abdomen: soft nontender, nondistended, normal bowel sounds, no hepatosplenomegaly, no hernias  Musculoskeletal: :  no cyanosis, clubbing or edema noted bilaterally, Decreased ROM left knee, Dressing over left knee Neuro: Cranial nerves II-XII intact, strength, sensation, reflexes Psych: judgement and insight appear normal, stable mood and affect, mental status Skin: no rashes or lesions or ulcers, no induration or nodules   Data reviewed:  I have personally reviewed following labs and imaging studies Labs:  CBC: Recent Labs  Lab 12/11/19 1232 12/12/19 0315  WBC 7.2 7.9  HGB 10.7* 11.1*  HCT 34.0* 35.6*  MCV 91.4 92.2  PLT 203 [id number]    Basic Metabolic Panel: Recent Labs  Lab 12/11/19 1232 12/12/19 0315  NA  --  138  K  --  4.2  CL  --  107  CO2  --  24  GLUCOSE  --  147*  BUN  --  15  CREATININE 0.50 0.62  CALCIUM  --  7.9*   GFR Estimated Creatinine Clearance: 104.7 mL/min (by C-G formula based on SCr of 0.62 mg/dL). Liver Function Tests: No results for input(s): AST, ALT, ALKPHOS, BILITOT, PROT, ALBUMIN in the last 168 hours. No results for input(s): LIPASE, AMYLASE in the last 168 hours. No results for input(s): AMMONIA in the last 168 hours. Coagulation profile Recent Labs  Lab 12/08/19 1016  INR 1.0    Cardiac Enzymes: No results for input(s): CKTOTAL, CKMB, CKMBINDEX, TROPONINI in the last 168 hours. BNP: Invalid input(s): POCBNP CBG: No results for input(s): GLUCAP in the last 168 hours. D-Dimer No results for input(s): DDIMER in the last 72 hours. Hgb A1c No results for input(s): HGBA1C in the last  72 hours. Lipid Profile No results for input(s): CHOL, HDL, LDLCALC, TRIG, CHOLHDL, LDLDIRECT in the last 72 hours. Thyroid function studies No results for input(s): TSH, T4TOTAL, T3FREE, THYROIDAB in the last 72 hours.  Invalid input(s): FREET3 Anemia work up No results for input(s): VITAMINB12, FOLATE, FERRITIN, TIBC, IRON, RETICCTPCT in the last 72 hours. Urinalysis    Component Value Date/Time   COLORURINE AMBER (A) 12/12/2019 0949   APPEARANCEUR CLOUDY (A) 12/12/2019 0949   LABSPEC 1.033 (H) 12/12/2019 0949   PHURINE 5.0 12/12/2019 0949   GLUCOSEU NEGATIVE 12/12/2019 0949   HGBUR LARGE (A) 12/12/2019 0949   BILIRUBINUR NEGATIVE 12/12/2019 0949   KETONESUR NEGATIVE 12/12/2019 0949   PROTEINUR 100 (A) 12/12/2019 0949   NITRITE NEGATIVE 12/12/2019 0949   LEUKOCYTESUR NEGATIVE 12/12/2019 0949     Microbiology Recent Results (from the past 240 hour(s))  Urine culture     Status: Abnormal   Collection Time: 12/08/19 10:16 AM   Specimen: Urine, Random  Result Value Ref Range Status   Specimen Description   Final    URINE, RANDOM Performed at [facility], [address], [place], [place] [address]    Special Requests   Final    NONE Performed at [facility], [address] [place], [place] [address]    Culture (A)  Final    30,000 COLONIES/mL GROUP B STREP(S.AGALACTIAE)ISOLATED TESTING AGAINST S. AGALACTIAE NOT ROUTINELY PERFORMED DUE TO PREDICTABILITY OF AMP/PEN/VAN SUSCEPTIBILITY. Performed at [facility], [place] [address] [place],  [address]    Report Status 12/09/2019 FINAL  Final       Inpatient Medications:   Scheduled Meds: . docusate sodium  100 mg Oral BID  . enoxaparin (LOVENOX) injection  30 mg Subcutaneous Q12H  . folic acid  1 mg Oral QHS  .  morphine injection  2 mg Intravenous Once  . pantoprazole  40 mg  Oral Daily  . traMADol  50 mg Oral Q6H   Continuous Infusions: . sodium chloride 75 mL/hr at 12/11/19 1308   . methocarbamol (ROBAXIN) IV       Radiological Exams on Admission: DG Knee 1-2 Views Left  Result Date: 12/11/2019 CLINICAL DATA:  Status post total knee replacement EXAM: LEFT KNEE - 1-2 VIEW COMPARISON:  05/29/2019 FINDINGS: Status post left knee total arthroplasty with expected overlying postoperative changes. No evidence of perihardware fracture or component malpositioning. IMPRESSION: Status post left knee total arthroplasty. Electronically Signed   By: Eddie Candle M.D.   On: 12/11/2019 10:28    Impression/Recommendations Principal Problem:   Tachycardia Active Problems:   OBESITY NOS   Essential hypertension   Knee joint replacement status, left   Tachycardia Most likely related to uncontrolled post surgical pain Patient is on Tramadol with minimal improvement in her pain Will give one dose of Morphine IV Obtain TSH Will attempt to optimize pain control and if patient remains tachycardic will investigate other causes of tachycardia including ruling out DVT   Morbid obesity (BMI [id number]  Complicates overall prognosis and care   Hypertension Start patient on Amlodipine 5mg  daily   Status post left knee total arthroplasty Further management per orthopedics Pain control Physical therapy Lovenox for DVT prophylaxis   Thank you for this consultation.  Our [facility] hospitalist team will follow the patient with you.   Time Spent: 38 minutes  Dalaysia Harms M.D. [facility] 12/12/2019, 1:49 PM

## 2019-12-12 NOTE — Progress Notes (Signed)
Physical Therapy Treatment Patient Details Name: Michelle Jordan MRN: [id number] DOB: 1956/02/12 Today's Date: 12/12/2019    History of Present Illness pt is a 64 yo female s/p L TKR on 2/18, PMH includes obesity, R TKR in 2016, OA, obesity, HTN, HLD    PT Comments    Pt received in bed and agreeable to participate with PT. Pt reporting severe pain at 8/10 and increased frustration over pain control. Pt reports feeling like people are not believing her in regards to her pain with pt becoming tearful. Nursing arrived to provide pain medication. Of note, ice has not been applied since this morning as pt waiting for new polar care after initial machine leaked all over bed this am. Pt improved slightly from this am with LE therex with slightly improved quad contraction during quad sets. Pt minimally able to tolerate knee ROM but able to progress to very small range left knee ROM sitting EOB. Pt with continued use of leg lift strap for bed mobility. Pt required min A to rise from elevated bed with improved carryover of hand placement and use of RW.  Pt progressed to improved weight acceptance during standing weight shifts but cont to be limited by pain and rely heavily on RW. Pt progressed to taking a few steps from EOB and back with therapist blocking left knee due to pt reporting she was scared her knee would buckle. No buckling noted however only a couple of steps were taken. Pt continues to be extremely limited due to high levels of pain. Pt ended session supine in bed with heel elevated and nursing present in room working on polar care mgt in order to provide ice for pain relief. Pt will continue to benefit from further acute PT to continue progressing functional mobility.    Follow Up Recommendations  Home health PT;Follow surgeon's recommendation for DC plan and follow-up therapies;Other (comment)(pending improved pain control)     Equipment Recommendations  None recommended by PT     Recommendations for Other Services       Precautions / Restrictions Precautions Precautions: Knee;Fall Restrictions Weight Bearing Restrictions: Yes LLE Weight Bearing: Weight bearing as tolerated    Mobility  Bed Mobility Overal bed mobility: Needs Assistance Bed Mobility: Supine to Sit;Sit to Supine     Supine to sit: Min assist;HOB elevated Sit to supine: Min assist;HOB elevated   General bed mobility comments: min A to get EOB and return to supine, pt utilizing leg lifter strap to advance LLE  Transfers Overall transfer level: Needs assistance Equipment used: Rolling walker (2 wheeled) Transfers: Sit to/from Stand Sit to Stand: Min assist;From elevated surface         General transfer comment: min A to rise from elevated bed, good carryover of hand placement of RW, cont to be limited due to pain  Ambulation/Gait Ambulation/Gait assistance: Min assist       Gait velocity: dec   General Gait Details: pt able to take a couple steps forward and backwards, severely limited by pain with heavy reliance on RW, min A with therapist blocking Left knee due to pt reporting fear of knee buckling   Stairs             Wheelchair Mobility    Modified Rankin (Stroke Patients Only)       Balance Overall balance assessment: Needs assistance Sitting-balance support: Feet supported;Single extremity supported Sitting balance-Leahy Scale: Good       Standing balance-Leahy Scale: Fair Standing balance comment: heavy reliance  on RW at this time secondary to pain                            Cognition Arousal/Alertness: Awake/alert Behavior During Therapy: WFL for tasks assessed/performed Overall Cognitive Status: Within Functional Limits for tasks assessed                                        Exercises Total Joint Exercises Ankle Circles/Pumps: AROM;Both;10 reps Quad Sets: AROM;Left;5 reps Heel Slides: AROM;Left;5 reps Hip  ABduction/ADduction: AAROM;Left;5 reps Straight Leg Raises: AAROM;Left;5 reps Other Exercises Other Exercises: pt performed LE therex from HEP including AP, quad sets, AAROM SLR and Hip abd, LAQ (minimal ROM although improved from am) and knee flexion (minimal but improved from am), limited by pain, pt performing 5-10 reps each exercise, improved quad contraction during quad set Other Exercises:     General Comments General comments (skin integrity, edema, etc.): wound vac in place t/o session, follow up with nursing regarding polar care as pt has not had ice on all day secondary to needing new polar care machine after first one leaked all over bed this morning ice applied end of session      Pertinent Vitals/Pain Pain Assessment: 0-10 Pain Score: 9  Pain Location: L knee Pain Descriptors / Indicators: Sore;Grimacing;Operative site guarding;Sharp;Moaning;Crying Pain Intervention(s): Limited activity within patient's tolerance;Monitored during session;Repositioned;RN gave pain meds during session;Ice applied    Home Living Family/patient expects to be discharged to:: Private residence Living Arrangements: Spouse/significant other Available Help at Discharge: Family;Available 24 hours/day Type of Home: House Home Access: Stairs to enter   Home Layout: One level Home Equipment: Environmental consultant - 2 wheels;Walker - 4 wheels;Bedside commode;Shower seat;Cane - single point Additional Comments: states never used BSC, has shower chair in both showers and removable shower head    Prior Function Level of Independence: Independent with assistive device(s)      Comments: uses SPC for community ambulation/distance, no AD for household ambulation. States she was performing all self care I'ly including after her last surgery.   PT Goals (current goals can now be found in the care plan section) Acute Rehab PT Goals Patient Stated Goal: reduce pain PT Goal Formulation: With patient Time For Goal  Achievement: 12/26/19 Potential to Achieve Goals: Good Progress towards PT goals: Progressing toward goals(very slowly due to pain)    Frequency    BID      PT Plan Current plan remains appropriate    Co-evaluation              AM-PAC PT "6 Clicks" Mobility   Outcome Measure  Help needed turning from your back to your side while in a flat bed without using bedrails?: A Little Help needed moving from lying on your back to sitting on the side of a flat bed without using bedrails?: A Little Help needed moving to and from a bed to a chair (including a wheelchair)?: A Little Help needed standing up from a chair using your arms (e.g., wheelchair or bedside chair)?: A Little Help needed to walk in hospital room?: A Lot Help needed climbing 3-5 steps with a railing? : A Lot 6 Click Score: 16    End of Session Equipment Utilized During Treatment: Gait belt Activity Tolerance: Patient limited by pain Patient left: in bed;with call bell/phone within reach;with SCD's reapplied;with nursing/sitter in  room Nurse Communication: Mobility status PT Visit Diagnosis: Other abnormalities of gait and mobility (R26.89);Pain;Difficulty in walking, not elsewhere classified (R26.2) Pain - Right/Left: Left Pain - part of body: Knee     Time: 1341-1414 PT Time Calculation (min) (ACUTE ONLY): 33 min  Charges:  $Therapeutic Exercise: 8-22 mins $Therapeutic Activity: 23-37 mins                     Michelle Jordan PT, DPT 3:11 PM,12/12/19 [phone]    Michelle Jordan Michelle Jordan 12/12/2019, 3:01 PM

## 2019-12-12 NOTE — Evaluation (Signed)
Physical Therapy Evaluation Patient Details Name: Michelle Jordan MRN: [id number] DOB: 26-Apr-1956 Today's Date: 12/12/2019   History of Present Illness  pt is a 64 yo female s/p L TKR on 2/18, PMH includes obesity, R TKR in 2016, OA, obesity, HTN, HLD  Clinical Impression  Pt is a 64 yo female s/p L TKR. Pt reports living in a one level home with her husband who is available 24/7. Pt reporting severe pain t/o session and very pain limited. Pt has necessary DME at home including RW, BSC and shower chair. Pt educated on HEP with pt requiring encouragement for active participation. Pt minimally able to tolerate very small ROM by therapist sitting EOB limited by pain. Pt required physical assist to perform SLR. Pt able to get EOB with use of strap pt brought from home, increased time and effort with min guard from therapist. Pt educated on importance of actively participating with therapy and not relying on strap. Pt steady EOB with increased time to slowly lower LLE to ground. Pt limited by pain with leg dangling EOB. Pt required min A to rise from elevated bed with RW. Pt with heavy reliance on RW for BUE support in standing secondary to pain. Pt with very minimal weight bearing on LLE despite education on WBAT. Pt often holding foot off the ground due to pain. Pt progressed to attempting standing weight shifts for improved weight bearing on LLE. Pt refusing to attempt ambulation this session due to pain. Pt presents with increased pain and decreased strength, ROM, balance and activity tolerance and would benefit from further acute PT. Recommendation for HHPT at this time depending on progression while in hospital and improved pain control. Pt reporting she managed all on her own after her R knee was replaced a few years ago.     Follow Up Recommendations Home health PT;Follow surgeon's recommendation for DC plan and follow-up therapies;Other (comment)(pending improved pain control and progression)     Equipment Recommendations  None recommended by PT    Recommendations for Other Services       Precautions / Restrictions Precautions Precautions: Knee Restrictions Weight Bearing Restrictions: Yes LLE Weight Bearing: Weight bearing as tolerated      Mobility  Bed Mobility Overal bed mobility: Needs Assistance Bed Mobility: Supine to Sit     Supine to sit: Min assist;HOB elevated     General bed mobility comments: light min A to get EOB, pt utilizing strap for LLE advancement, increased time and effort  Transfers Overall transfer level: Needs assistance Equipment used: Rolling walker (2 wheeled) Transfers: Sit to/from Stand Sit to Stand: Min assist;From elevated surface         General transfer comment: min A to rise from elevated bed, increased time and effort, very pain limited  Ambulation/Gait             General Gait Details: refusing to attempt  Stairs            Wheelchair Mobility    Modified Rankin (Stroke Patients Only)       Balance Overall balance assessment: Mild deficits observed, not formally tested(steady EOB)                                           Pertinent Vitals/Pain Pain Assessment: 0-10 Pain Score: 8  Pain Location: L knee Pain Descriptors / Indicators: Sore;Grimacing;Operative site guarding;Sharp;Moaning Pain Intervention(s): Limited  activity within patient's tolerance;Monitored during session;Repositioned;Premedicated before session    Sharon expects to be discharged to:: Private residence Living Arrangements: Spouse/significant other Available Help at Discharge: Family;Available 24 hours/day Type of Home: House Home Access: Stairs to enter   [employer] of Steps: 3 Home Layout: One level Home Equipment: Walker - 2 wheels;Walker - 4 wheels;Bedside commode;Shower seat;Cane - single point      Prior Function Level of Independence: Independent with assistive  device(s)         Comments: uses SPC for community ambulation, no AD for household ambulation     Hand Dominance        Extremity/Trunk Assessment   Upper Extremity Assessment Upper Extremity Assessment: Defer to OT evaluation    Lower Extremity Assessment Lower Extremity Assessment: LLE deficits/detail LLE Deficits / Details: dec strength, ROM, balance consistent with recent surgery LLE: Unable to fully assess due to pain       Communication   Communication: No difficulties  Cognition Arousal/Alertness: Awake/alert Behavior During Therapy: WFL for tasks assessed/performed Overall Cognitive Status: Within Functional Limits for tasks assessed                                        General Comments General comments (skin integrity, edema, etc.): wound vac in place t/o session    Exercises Total Joint Exercises Ankle Circles/Pumps: AROM;Both;10 reps Quad Sets: AROM;Left;5 reps Heel Slides: AROM;Left;5 reps Hip ABduction/ADduction: AAROM;Left;5 reps Straight Leg Raises: AAROM;Left;5 reps   Assessment/Plan    PT Assessment Patient needs continued PT services  PT Problem List Decreased strength;Decreased mobility;Decreased safety awareness;Decreased range of motion;Decreased knowledge of precautions;Decreased knowledge of use of DME;Decreased balance;Pain;Decreased activity tolerance       PT Treatment Interventions DME instruction;Therapeutic exercise;Gait training;Balance training;Stair training;Neuromuscular re-education;Functional mobility training;Therapeutic activities;Patient/family education    PT Goals (Current goals can be found in the Care Plan section)  Acute Rehab PT Goals Patient Stated Goal: reduce pain PT Goal Formulation: With patient Time For Goal Achievement: 12/26/19 Potential to Achieve Goals: Good    Frequency BID   Barriers to discharge        Co-evaluation               AM-PAC PT "6 Clicks" Mobility   Outcome Measure Help needed turning from your back to your side while in a flat bed without using bedrails?: A Little Help needed moving from lying on your back to sitting on the side of a flat bed without using bedrails?: A Little Help needed moving to and from a bed to a chair (including a wheelchair)?: A Little Help needed standing up from a chair using your arms (e.g., wheelchair or bedside chair)?: A Little Help needed to walk in hospital room?: A Lot Help needed climbing 3-5 steps with a railing? : A Lot 6 Click Score: 16    End of Session Equipment Utilized During Treatment: Gait belt Activity Tolerance: Patient limited by pain Patient left: in bed;with call bell/phone within reach;with SCD's reapplied Nurse Communication: Mobility status PT Visit Diagnosis: Other abnormalities of gait and mobility (R26.89);Pain Pain - Right/Left: Left Pain - part of body: Knee    Time: [id number] PT Time Calculation (min) (ACUTE ONLY): 34 min   Charges:   PT Evaluation $PT Eval Moderate Complexity: 1 Mod PT Treatments $Therapeutic Exercise: 8-22 mins       Jourdyn Ferrin PT, DPT  1:23 PM,12/12/19 [id number]    Cambryn Charters Drucilla Chalet 12/12/2019, 1:15 PM

## 2019-12-12 NOTE — TOC Progression Note (Signed)
Transition of Care [facility]) - Progression Note    Patient Details  Name: Michelle Jordan MRN: [id number] Date of Birth: 23-Oct-1955  Transition of Care [facility]) CM/SW Contact  Geraldina Parrott, Gard[place] Rhyme, LCSW Phone Number: 12/12/2019, 9:17 AM  Clinical Narrative: referral made to [facility] for PT follow up once medically ready. Pt aware of the plan      Expected Discharge Plan: [place] Barriers to Discharge: Continued Medical Work up  Expected Discharge Plan and Services Expected Discharge Plan: [employer] In-house Referral: Clinical Social Work   Post Acute Care Choice: [place] arrangements for the past 2 months: Single Family Home                                       Social Determinants of Health (SDOH) Interventions    Readmission Risk Interventions No flowsheet data found.

## 2019-12-13 DIAGNOSIS — Z96652 Presence of left artificial knee joint: Secondary | ICD-10-CM

## 2019-12-13 DIAGNOSIS — I10 Essential (primary) hypertension: Secondary | ICD-10-CM

## 2019-12-13 DIAGNOSIS — Z6841 Body Mass Index (BMI) 40.0 and over, adult: Secondary | ICD-10-CM

## 2019-12-13 DIAGNOSIS — R000 Tachycardia, unspecified: Secondary | ICD-10-CM

## 2019-12-13 LAB — CBC
HCT: 35.7 % — ABNORMAL LOW (ref 36.0–46.0)
Hemoglobin: 11.2 g/dL — ABNORMAL LOW (ref 12.0–15.0)
MCH: 28.5 pg (ref 26.0–34.0)
MCHC: 31.4 g/dL (ref 30.0–36.0)
MCV: 90.8 fL (ref 80.0–100.0)
Platelets: 192 10*3/uL (ref 150–400)
RBC: 3.93 MIL/uL (ref 3.87–5.11)
RDW: 13.6 % (ref 11.5–15.5)
WBC: 9.3 10*3/uL (ref 4.0–10.5)
nRBC: 0 % (ref 0.0–0.2)

## 2019-12-13 LAB — BASIC METABOLIC PANEL
Anion gap: 9 (ref 5–15)
BUN: 8 mg/dL (ref 8–23)
CO2: 25 mmol/L (ref 22–32)
Calcium: 8 mg/dL — ABNORMAL LOW (ref 8.9–10.3)
Chloride: 100 mmol/L (ref 98–111)
Creatinine, Ser: 0.54 mg/dL (ref 0.44–1.00)
GFR calc Af Amer: >60 mL/min (ref >60)
GFR calc non Af Amer: >60 mL/min (ref >60)
Glucose, Bld: 130 mg/dL — ABNORMAL HIGH (ref 70–99)
Potassium: 4 mmol/L (ref 3.5–5.1)
Sodium: 134 mmol/L — ABNORMAL LOW (ref 135–145)

## 2019-12-13 LAB — URINE CULTURE: Culture: NO GROWTH

## 2019-12-13 LAB — FIBRIN DERIVATIVES D-DIMER (ARMC ONLY): Fibrin derivatives D-dimer (ARMC): 1825.13 ng/mL (FEU) — ABNORMAL HIGH (ref 0.00–499.00)

## 2019-12-13 MED ORDER — METOPROLOL TARTRATE 25 MG PO TABS: 25.0000 mg | ORAL_TABLET | Freq: Two times a day (BID) | ORAL | Status: DC

## 2019-12-13 MED ORDER — METOPROLOL TARTRATE 25 MG PO TABS
25.0000 mg | ORAL_TABLET | Freq: Two times a day (BID) | ORAL | Status: DC
  Administered 2019-12-13 – 2019-12-14 (×3): 25 mg via ORAL
  Filled 2019-12-13 (×3): qty 1

## 2019-12-13 NOTE — Progress Notes (Signed)
1        [place] [facility] [place] NAME: Michelle Jordan    MR#:  [id number]  DATE OF BIRTH:  1955/11/06  SUBJECTIVE:  CHIEF COMPLAINT:  No chief complaint on file. Seem to be in a lot of pain.  Persistent tachycardia and elevated blood pressure REVIEW OF SYSTEMS:  Review of Systems  Constitutional: Negative for diaphoresis, fever, malaise/fatigue and weight loss.  HENT: Negative for ear discharge, ear pain, hearing loss, nosebleeds, sore throat and tinnitus.   Eyes: Negative for blurred vision and pain.  Respiratory: Negative for cough, hemoptysis, shortness of breath and wheezing.   Cardiovascular: Negative for chest pain, palpitations, orthopnea and leg swelling.  Gastrointestinal: Negative for abdominal pain, blood in stool, constipation, diarrhea, heartburn, nausea and vomiting.  Genitourinary: Negative for dysuria, frequency and urgency.  Musculoskeletal: Positive for joint pain. Negative for back pain and myalgias.  Skin: Negative for itching and rash.  Neurological: Negative for dizziness, tingling, tremors, focal weakness, seizures, weakness and headaches.  Psychiatric/Behavioral: Negative for depression. The patient is not nervous/anxious.    DRUG ALLERGIES:   Allergies  Allergen Reactions  . Etodolac Other (See Comments)    Patient is unsure (cannot remember) reaction type  . Isometheptene-Dichloral-Apap     (Midrin)Caused tingling  . Penicillins Other (See Comments)    Did it involve swelling of the face/tongue/throat, SOB, or low BP? No Did it involve sudden or severe rash/hives, skin peeling, or any reaction on the inside of your mouth or nose? No Did you need to seek medical attention at a hospital or doctor's office? No When did it last happen?DOES NOT HAVE A REACTION TO PENICILLIN (PENICILLIN contraindicated with METHOTREXATE) If all above answers are "NO", may proceed with cephalosporin use. .  . Spironolactone Other (See Comments)    Gout    . Statins Other (See Comments)    Calf/leg pain  . Amitriptyline Anxiety    moodiness   VITALS:  Blood pressure (!) 163/72, pulse (!) 111, temperature 98.6 F (37 C), temperature source Oral, resp. rate 17, height 5\' 4"  (1.626 m), weight (!) 142.4 kg, SpO2 93 %. PHYSICAL EXAMINATION:  Physical Exam HENT:     Head: Normocephalic and atraumatic.  Eyes:     Conjunctiva/sclera: Conjunctivae normal.     Pupils: Pupils are equal, round, and reactive to light.  Neck:     Thyroid: No thyromegaly.     Trachea: No tracheal deviation.  Cardiovascular:     Rate and Rhythm: Normal rate and regular rhythm.     Heart sounds: Normal heart sounds.  Pulmonary:     Effort: Pulmonary effort is normal. No respiratory distress.     Breath sounds: Normal breath sounds. No wheezing.  Chest:     Chest wall: No tenderness.  Abdominal:     General: Bowel sounds are normal. There is no distension.     Palpations: Abdomen is soft.     Tenderness: There is no abdominal tenderness.  Musculoskeletal:        General: Normal range of motion.     Cervical back: Normal range of motion and neck supple.     Comments: Left knee dressing in place and looks clean   Skin:    General: Skin is warm and dry.     Findings: No rash.  Neurological:     Mental Status: She is alert and oriented to person, place, and time.     Cranial Nerves: No cranial nerve  deficit.    LABORATORY PANEL:  Female CBC Recent Labs  Lab 12/13/19 0509  WBC 9.3  HGB 11.2*  HCT 35.7*  PLT 192   ------------------------------------------------------------------------------------------------------------------ Chemistries  Recent Labs  Lab 12/13/19 0509  NA 134*  K 4.0  CL 100  CO2 25  GLUCOSE 130*  BUN 8  CREATININE 0.54  CALCIUM 8.0*   RADIOLOGY:  No results found. ASSESSMENT AND PLAN:   Persistent tachycardia Most likely related to uncontrolled post surgical pain -Discussed with primary team to to optimize pain  control  -Change Norvasc to metoprolol for now as her blood pressure and heart rate both are elevated -check D-dimer, if elevated, can consider lower extremity Doppler and/or CT chest to rule out DVT/PE but unlikely as she is already fully anticoagulated with Lovenox 30 mg subcu twice daily  Morbid obesity (BMI [id number]  Complicates overall prognosis and care  Hypertension/tachycardia Change Norvasc to metoprolol   Status post left knee total arthroplasty Further management per orthopedics Pain control Physical therapy Lovenox for DVT prophylaxis   Discharge plans per primary team   All the records are reviewed and case discussed with Care Management/Social Worker. Management plans discussed with the patient, primary team and they are in agreement.  CODE STATUS: Full Code  TOTAL TIME TAKING CARE OF THIS PATIENT: 15 minutes.   More than 50% of the time was spent in counseling/coordination of care: YES    Max Sane M.D on 12/13/2019 at 10:47 AM  [facility]   CC: Primary care physician; Tracie Harrier, MD  Note: This dictation was prepared with Dragon dictation along with smaller phrase technology. Any transcriptional errors that result from this process are unintentional.

## 2019-12-13 NOTE — Progress Notes (Signed)
MEWS yellow, HR 110's, bp elevated a little. Metoprolol added by MD.

## 2019-12-13 NOTE — Progress Notes (Signed)
Physical Therapy Treatment Patient Details Name: Michelle Jordan MRN: [id number] DOB: 04-30-1956 Today's Date: 12/13/2019    History of Present Illness pt is a 64 yo female s/p L TKR on 2/18, PMH includes obesity, R TKR in 2016, OA, obesity, HTN, HLD    PT Comments    Pt was long sitting in bed upon arriving. She was premedicated for pain ~ 30 minutes prior. Pain at rest 3/10 that elevated to 8/10 in wt bearing.  Resting HR 110 BP 144/85 and sao2 98%. She agrees to OOB activity and reports being very motivated to DC home. " I didn't have this much pain in previous TKR." Pt was able to exit L side of bed (HOB elevated) using "strap" to guide LLE to EOB then floor. No physical assistance required. She then was able to stand with CGA + gait belt to RW. Took several steps to recliner and requested seated rest. Pt was then able to progress to ambulation to doorway of her room prior to HR elevation to 150 bpm and pt returned to recliner. No LOB noted during ambulation. Pt was asymptomatic and reports no fatigue however unsafe to progress to further gait distance. Pt was repositioned in recliner with polar care re-applied, call bell in reach, and wound vac in place. Overall pt tolerated session well but was limited by pain/ cardiac concerns. Discussed DC disposition with pt. She does not want to go to rehab even witht herapist discussing benefits of doing so. PT recommends home with HH to address strength, ROM, and safe functional mobility deficits.   Follow Up Recommendations  Home health PT;Follow surgeon's recommendation for DC plan and follow-up therapies     Equipment Recommendations  3in1 (PT)    Recommendations for Other Services       Precautions / Restrictions Precautions Precautions: Knee;Fall Precaution Booklet Issued: No Restrictions Weight Bearing Restrictions: Yes LLE Weight Bearing: Weight bearing as tolerated    Mobility  Bed Mobility Overal bed mobility: Needs  Assistance Bed Mobility: Supine to Sit     Supine to sit: HOB elevated;Supervision     General bed mobility comments: Pt used personal strap on LLE to progress LE to EOB/floor however did not require physical assistance. increased time to perform. Vcs throughout for improved technique  Transfers Overall transfer level: Needs assistance Equipment used: Rolling walker (2 wheeled) Transfers: Sit to/from Stand Sit to Stand: Min guard;From elevated surface         General transfer comment: Pt was able to stand from EOB and recliner with CGA for safety. Gait belt in place and vcs for handplacement and fwd wt shift  Ambulation/Gait Ambulation/Gait assistance: Min guard Gait Distance (Feet): 25 Feet Assistive device: Rolling walker (2 wheeled) Gait Pattern/deviations: Step-to pattern;Antalgic Gait velocity: decreased   General Gait Details: pt was able to ambulate from recliner to doorway of room and return. HR elevated to 150 bpm so did not progress ambulation further distances. She reports pain increase form 3/10 to 8/10 in wt bearing. no LOB or unsteadiness noted   Stairs             Wheelchair Mobility    Modified Rankin (Stroke Patients Only)       Balance                                            Cognition Arousal/Alertness: Awake/alert Behavior During  Therapy: WFL for tasks assessed/performed Overall Cognitive Status: Within Functional Limits for tasks assessed                                 General Comments: Pt was A and O x 4      Exercises      General Comments        Pertinent Vitals/Pain Pain Assessment: 0-10 Pain Score: 8  Pain Location: L knee Pain Descriptors / Indicators: Sore;Grimacing;Operative site guarding;Sharp;Moaning;Crying Pain Intervention(s): Limited activity within patient's tolerance;Monitored during session;Premedicated before session;Repositioned;Ice applied    Home Living                       Prior Function            PT Goals (current goals can now be found in the care plan section) Acute Rehab PT Goals Patient Stated Goal: " get moving so I can home" Progress towards PT goals: Progressing toward goals    Frequency    BID      PT Plan Current plan remains appropriate    Co-evaluation              AM-PAC PT "6 Clicks" Mobility   Outcome Measure  Help needed turning from your back to your side while in a flat bed without using bedrails?: A Little Help needed moving from lying on your back to sitting on the side of a flat bed without using bedrails?: A Little Help needed moving to and from a bed to a chair (including a wheelchair)?: A Little Help needed standing up from a chair using your arms (e.g., wheelchair or bedside chair)?: A Little Help needed to walk in hospital room?: A Little Help needed climbing 3-5 steps with a railing? : A Little 6 Click Score: 18    End of Session Equipment Utilized During Treatment: Gait belt Activity Tolerance: Patient tolerated treatment well;Other (comment)(limited by HR 150 bpm) Patient left: in chair;with chair alarm set;with call bell/phone within reach Nurse Communication: Mobility status PT Visit Diagnosis: Other abnormalities of gait and mobility (R26.89);Pain;Difficulty in walking, not elsewhere classified (R26.2) Pain - Right/Left: Left Pain - part of body: Knee     Time: [id number] PT Time Calculation (min) (ACUTE ONLY): 25 min  Charges:  $Gait Training: 8-22 mins $Therapeutic Activity: 8-22 mins                     Julaine Fusi PTA 12/13/19, 11:30 AM

## 2019-12-13 NOTE — Progress Notes (Addendum)
Subjective: 2 Days Post-Op Procedure(s) (LRB): LEFT TOTAL KNEE ARTHROPLASTY (Left) Patient reports pain as 7 on 0-10 scale.   States that her pain has slightly improved since yesterday. Internal medicine consulted for tachycardia, likely due to pain. Denies any CP, SOB, ABD pain.  No palpitations We will continue therapy today.  Plan is to go Home after hospital stay.  Objective: Vital signs in last 24 hours: Temp:  [98.6 F (37 C)-99.7 F (37.6 C)] 98.9 F (37.2 C) (03/20 0811) Pulse Rate:  [108-120] 116 (03/20 0811) Resp:  [17] 17 (03/20 0811) BP: (148-180)/(74-90) 180/74 (03/20 0811) SpO2:  [92 %-99 %] 96 % (03/20 0811)  Intake/Output from previous day: 03/19 0701 - 03/20 0700 In: 1718.5 [I.V.:1718.5] Out: 750 [Urine:750] Intake/Output this shift: No intake/output data recorded.  Recent Labs    12/11/19 1232 12/12/19 0315 12/13/19 0509  HGB 10.7* 11.1* 11.2*   Recent Labs    12/12/19 0315 12/13/19 0509  WBC 7.9 9.3  RBC 3.86* 3.93  HCT 35.6* 35.7*  PLT 207 192   Recent Labs    12/12/19 0315 12/13/19 0509  NA 138 134*  K 4.2 4.0  CL 107 100  CO2 24 25  BUN 15 8  CREATININE 0.62 0.54  GLUCOSE 147* 130*  CALCIUM 7.9* 8.0*   No results for input(s): LABPT, INR in the last 72 hours.  EXAM General - Patient is Alert, Appropriate and Oriented  Heart -tachycardic.  Regular rhythm Pulmonary -clear to auscultation bilaterally Extremity - Neurovascular intact Sensation intact distally Intact pulses distally Dorsiflexion/Plantar flexion intact No cellulitis present Compartment soft Dressing - dressing C/D/I and no drainage, Praveena intact without drainage Motor Function - intact, moving foot and toes well on exam.   Past Medical History:  Diagnosis Date  . AC (acromioclavicular) joint bone spurs    feet  . Anxiety   . Complication of anesthesia   . Diverticulosis of colon (without mention of hemorrhage)   . Epicondylitis, lateral    right  .  Gout, unspecified   . Headache   . History of shingles   . Hypothyroidism    per pt, was dx in 1978, previosly on Synthroid, but has not taken it in years  . Irritable bowel syndrome   . Migraine headache   . Migraine-cluster headache syndrome   . Morbid obesity ([place])   . Obesity, unspecified   . Osteoarthrosis, unspecified whether generalized or localized, unspecified site    knees, cervical spine  . Other abnormal glucose   . Other and unspecified hyperlipidemia   . Other psoriasis   . Plantar fasciitis   . PONV (postoperative nausea and vomiting)    nausea only with foot surgery in Jan. 2016, otherwise no nausea  . 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 Rosacea   . Shingles   . Stress fracture of right foot   . TMJ (temporomandibular joint disorder)   . Unspecified essential hypertension     Assessment/Plan:   2 Days Post-Op Procedure(s) (LRB): LEFT TOTAL KNEE ARTHROPLASTY (Left) Principal Problem:   Tachycardia Active Problems:   OBESITY NOS   Essential hypertension   Knee joint replacement status, left  Estimated body mass index is 53.9 kg/m as calculated from the following:   Height as of this encounter: 5\' 4"  (1.626 m).   Weight as of this encounter: 142.4 kg. Advance diet Up with therapy   Labs reviewed this AM, Hg 11.2 this morning. Patient still tachycardic, HR 116 this  AM.  Likely due to pain, internal medicine has been consulted. Continue to work on BM. Encouraged incentive spirometer, UA without evidence for gross infection. Denies any chest pain, SOB.  96% on room air.  Hold from CTA of the chest at this time. Continue with PT today.  Plan for discharge home with HHPT when able.  Care management to assist with discharge  DVT Prophylaxis - Lovenox, Foot Pumps and TED hose Weight-Bearing as tolerated to left leg  J. Cameron Proud, PA-C [place] 12/13/2019, 8:48 AM

## 2019-12-13 NOTE — Progress Notes (Signed)
Physical Therapy Treatment Patient Details Name: Michelle Jordan MRN: [id number] DOB: [birth year] Today's Date: 12/13/2019    History of Present Illness pt is a 64 yo female s/p L TKR on 2/18, PMH includes obesity, R TKR in 2016, OA, obesity, HTN, HLD    PT Comments    Pt was seated in recliner upon therapist arrving for 2nd session this date. Pt reports feeling well with pain 6/10. Resting vitals: BP 141/82 HR 102 sao2 98%. Pt was requesting to use BR. She stood with supervision and was able to ambulate into BR using RW + CGA. Successfully urinated and was able to ambulate 100 ft into hallway with HR only elevating to 123bpm this afternoon versus 150 bpm this AM. Pt returned to her room and was repositioned in bed. She tolerated performing light there ex (listed below) with assistance. Overall pt has made great progressing in past 24 hours with PT. Pt continues to want to DC to home. Therapist recommends HHPT to follow once pt is medically stable for DC.      Follow Up Recommendations  Home health PT;Follow surgeon's recommendation for DC plan and follow-up therapies     Equipment Recommendations  3in1 (PT)    Recommendations for Other Services       Precautions / Restrictions Precautions Precautions: Knee;Fall Precaution Booklet Issued: No Restrictions Weight Bearing Restrictions: Yes LLE Weight Bearing: Weight bearing as tolerated    Mobility  Bed Mobility Overal bed mobility: Needs Assistance Bed Mobility: Sit to Supine     Supine to sit: HOB elevated;Supervision Sit to supine: Min assist   General bed mobility comments: Pt required min assist progressing BLEs back into bed  Transfers Overall transfer level: Needs assistance Equipment used: Rolling walker (2 wheeled) Transfers: Sit to/from Stand Sit to Stand: Supervision         General transfer comment: Pt was able to stand from recliner and elevated toilet seat with supervision  only.  Ambulation/Gait Ambulation/Gait assistance: Min guard Gait Distance (Feet): 100 Feet Assistive device: Rolling walker (2 wheeled) Gait Pattern/deviations: Step-to pattern;Antalgic Gait velocity: decreased   General Gait Details: Pt was able to ambulate increased distance from the AM session with  HR elevated to 123bpm. Pt tolerated well and required CGA for safety.   Stairs             Wheelchair Mobility    Modified Rankin (Stroke Patients Only)       Balance                                            Cognition Arousal/Alertness: Awake/alert Behavior During Therapy: WFL for tasks assessed/performed Overall Cognitive Status: Within Functional Limits for tasks assessed                                 General Comments: Pt was A and O x 4      Exercises Total Joint Exercises Ankle Circles/Pumps: AROM;Both;10 reps Quad Sets: AROM;Left;5 reps Heel Slides: Left;5 reps;AAROM Hip ABduction/ADduction: AAROM;Left;5 reps Straight Leg Raises: AAROM;Left;5 reps    General Comments        Pertinent Vitals/Pain Pain Assessment: 0-10 Pain Score: 4  Pain Location: L knee Pain Descriptors / Indicators: Sore;Grimacing;Operative site guarding;Sharp;Moaning;Crying Pain Intervention(s): Limited activity within patient's tolerance;Monitored during session;Premedicated before session;Repositioned;Ice applied  Home Living                      Prior Function            PT Goals (current goals can now be found in the care plan section) Acute Rehab PT Goals Patient Stated Goal: " get moving so I can go home" Progress towards PT goals: Progressing toward goals    Frequency    BID      PT Plan Current plan remains appropriate    Co-evaluation              AM-PAC PT "6 Clicks" Mobility   Outcome Measure  Help needed turning from your back to your side while in a flat bed without using bedrails?: A  Little Help needed moving from lying on your back to sitting on the side of a flat bed without using bedrails?: A Little Help needed moving to and from a bed to a chair (including a wheelchair)?: A Little Help needed standing up from a chair using your arms (e.g., wheelchair or bedside chair)?: A Little Help needed to walk in hospital room?: A Little Help needed climbing 3-5 steps with a railing? : A Little 6 Click Score: 18    End of Session Equipment Utilized During Treatment: Gait belt Activity Tolerance: Patient tolerated treatment well;Other (comment) Patient left: in bed;with call bell/phone within reach;with bed alarm set;with nursing/sitter in room Nurse Communication: Mobility status PT Visit Diagnosis: Other abnormalities of gait and mobility (R26.89);Pain;Difficulty in walking, not elsewhere classified (R26.2) Pain - Right/Left: Left Pain - part of body: Knee     Time: [id number] PT Time Calculation (min) (ACUTE ONLY): 34 min  Charges:  $Gait Training: 8-22 mins $Therapeutic Exercise: 8-22 mins $Therapeutic Activity: 8-22 mins                     Julaine Fusi PTA 12/13/19, 2:42 PM

## 2019-12-14 LAB — CBC
HCT: 33.5 % — ABNORMAL LOW (ref 36.0–46.0)
Hemoglobin: 10.9 g/dL — ABNORMAL LOW (ref 12.0–15.0)
MCH: 28.9 pg (ref 26.0–34.0)
MCHC: 32.5 g/dL (ref 30.0–36.0)
MCV: 88.9 fL (ref 80.0–100.0)
Platelets: 190 10*3/uL (ref 150–400)
RBC: 3.77 MIL/uL — ABNORMAL LOW (ref 3.87–5.11)
RDW: 13.6 % (ref 11.5–15.5)
WBC: 9.2 10*3/uL (ref 4.0–10.5)
nRBC: 0 % (ref 0.0–0.2)

## 2019-12-14 LAB — BASIC METABOLIC PANEL
Anion gap: 10 (ref 5–15)
BUN: 10 mg/dL (ref 8–23)
CO2: 27 mmol/L (ref 22–32)
Calcium: 8.3 mg/dL — ABNORMAL LOW (ref 8.9–10.3)
Chloride: 98 mmol/L (ref 98–111)
Creatinine, Ser: 0.6 mg/dL (ref 0.44–1.00)
GFR calc Af Amer: >60 mL/min (ref >60)
GFR calc non Af Amer: >60 mL/min (ref >60)
Glucose, Bld: 139 mg/dL — ABNORMAL HIGH (ref 70–99)
Potassium: 3.7 mmol/L (ref 3.5–5.1)
Sodium: 135 mmol/L (ref 135–145)

## 2019-12-14 MED ORDER — TRAMADOL HCL 50 MG PO TABS: 50.0000 mg | ORAL_TABLET | Freq: Four times a day (QID) | ORAL | 0 refills | Status: DC

## 2019-12-14 MED ORDER — ENOXAPARIN SODIUM 30 MG/0.3ML ~~LOC~~ SOLN: 30.0000 mg | Freq: Two times a day (BID) | SUBCUTANEOUS | 0 refills | Status: DC

## 2019-12-14 MED ORDER — ONDANSETRON HCL 4 MG PO TABS: 4.0000 mg | ORAL_TABLET | Freq: Four times a day (QID) | ORAL | 0 refills | Status: DC | PRN

## 2019-12-14 MED ORDER — OXYCODONE HCL 10 MG PO TABS: 10.0000 mg | ORAL_TABLET | ORAL | 0 refills | Status: DC | PRN

## 2019-12-14 MED ORDER — ZOLPIDEM TARTRATE 5 MG PO TABS: 5.0000 mg | ORAL_TABLET | Freq: Every evening | ORAL | 0 refills | Status: DC | PRN

## 2019-12-14 MED ORDER — METOPROLOL TARTRATE 25 MG PO TABS: 25.0000 mg | ORAL_TABLET | Freq: Two times a day (BID) | ORAL | 0 refills | Status: DC

## 2019-12-14 MED ORDER — METHOCARBAMOL 500 MG PO TABS: 500.0000 mg | ORAL_TABLET | Freq: Four times a day (QID) | ORAL | 0 refills | Status: DC | PRN

## 2019-12-14 NOTE — Progress Notes (Signed)
Patient's prevena wound vac is not working. [facility] paged, states we can change to regular honeycomb dressing and to give a few more dressings to patient to take home with her.

## 2019-12-14 NOTE — Discharge Instructions (Signed)
Diet: As you were doing prior to hospitalization   Shower:  May shower but keep the wounds dry, use an occlusive plastic wrap, NO SOAKING IN TUB.  If the bandage gets wet, change with a clean dry gauze.  Dressing:  Leave the woundvac on and intact.  HHPT will change the dressing for you when the suction is no longer functioning.   Activity:  Increase activity slowly as tolerated, but follow the weight bearing instructions below.  No lifting or driving for 6 weeks.  Weight Bearing:   Weight bearing as tolerated to left lower extremity  To prevent constipation: you may use a stool softener such as -  Colace (over the counter) 100 mg by mouth twice a day  Drink plenty of fluids (prune juice may be helpful) and high fiber foods Miralax (over the counter) for constipation as needed.    Itching:  If you experience itching with your medications, try taking only a single pain pill, or even half a pain pill at a time.  You may take up to 10 pain pills per day, and you can also use benadryl over the counter for itching or also to help with sleep.   Precautions:  If you experience chest pain or shortness of breath - call 911 immediately for transfer to the hospital emergency department!!  If you develop a fever greater that 101 F, purulent drainage from wound, increased redness or drainage from wound, or calf pain-Call [place]                                              Follow- Up Appointment:  Please call for an appointment to be seen in 2 weeks at [facility]

## 2019-12-14 NOTE — Progress Notes (Signed)
Patient discharging home. Honeycomb dressing in place on left knee. Discharge instructions given to patient, verbalized understanding. Husband to transport patient home.

## 2019-12-14 NOTE — Progress Notes (Signed)
Physical Therapy Treatment Patient Details Name: Michelle Jordan MRN: [id number] DOB: 10-06-1955 Today's Date: 12/14/2019    History of Present Illness pt is a 64 yo female s/p L TKR on 2/18, PMH includes obesity, R TKR in 2016, OA, obesity, HTN, HLD    PT Comments    Ready for session.  .Participated in exercises as described below.  Stood and was able to walk 110' and complete stair training with one rail left to simulate home.  HR 120's with activity.  Overall progressing well but continues with antalgic gait.  No LOB or buckling.  Generally safe with mobility.  Excited to go home today.  Voices no further questions.   Follow Up Recommendations  Home health PT;Follow surgeon's recommendation for DC plan and follow-up therapies     Equipment Recommendations  3in1 (PT)    Recommendations for Other Services       Precautions / Restrictions Precautions Precautions: Knee;Fall Precaution Booklet Issued: No Restrictions Weight Bearing Restrictions: Yes LLE Weight Bearing: Weight bearing as tolerated    Mobility  Bed Mobility Overal bed mobility: Needs Assistance             General bed mobility comments: in recliner before and after  Transfers Overall transfer level: Needs assistance Equipment used: Rolling walker (2 wheeled) Transfers: Sit to/from Stand Sit to Stand: Supervision         General transfer comment: Pt was able to stand from recliner and elevated toilet seat with supervision only.  Ambulation/Gait Ambulation/Gait assistance: Min guard Gait Distance (Feet): 110 Feet Assistive device: Rolling walker (2 wheeled) Gait Pattern/deviations: Step-to pattern;Antalgic Gait velocity: decreased   General Gait Details: Pt was able to ambulate increased distance from the AM session with  HR elevated to 123bpm. Pt tolerated well and required CGA for safety.   Stairs Stairs: Yes Stairs assistance: Min guard Stair Management: One rail Left Number of Stairs:  4 General stair comments: overall does well.   Wheelchair Mobility    Modified Rankin (Stroke Patients Only)       Balance Overall balance assessment: Needs assistance Sitting-balance support: Feet supported;Single extremity supported Sitting balance-Leahy Scale: Good     Standing balance support: Bilateral upper extremity supported Standing balance-Leahy Scale: Fair Standing balance comment: heavy reliance on RW at this time secondary to pain                            Cognition Arousal/Alertness: Awake/alert Behavior During Therapy: WFL for tasks assessed/performed Overall Cognitive Status: Within Functional Limits for tasks assessed                                 General Comments: Pt was A and O x 4      Exercises Total Joint Exercises Ankle Circles/Pumps: AROM;Both;10 reps Quad Sets: AROM;Left;10 reps Heel Slides: Left;AAROM;10 reps Hip ABduction/ADduction: AAROM;Left;10 reps Straight Leg Raises: AAROM;Left;10 reps Long Arc Quad: AAROM;Left;10 reps Knee Flexion: AAROM;Left;5 reps Goniometric ROM: 3-80 Other Exercises Other Exercises: to bathroom to void.  no BM.  RN in to address    General Comments        Pertinent Vitals/Pain Pain Assessment: 0-10 Pain Score: 4  Pain Location: L knee Pain Descriptors / Indicators: Sore;Grimacing;Operative site guarding;Sharp;Moaning;Crying Pain Intervention(s): RN gave pain meds during session;Monitored during session;Limited activity within patient's tolerance    Home Living  Prior Function            PT Goals (current goals can now be found in the care plan section) Progress towards PT goals: Progressing toward goals    Frequency    BID      PT Plan Current plan remains appropriate    Co-evaluation              AM-PAC PT "6 Clicks" Mobility   Outcome Measure  Help needed turning from your back to your side while in a flat bed without  using bedrails?: A Little Help needed moving from lying on your back to sitting on the side of a flat bed without using bedrails?: A Little Help needed moving to and from a bed to a chair (including a wheelchair)?: A Little Help needed standing up from a chair using your arms (e.g., wheelchair or bedside chair)?: A Little Help needed to walk in hospital room?: A Little Help needed climbing 3-5 steps with a railing? : A Little 6 Click Score: 18    End of Session Equipment Utilized During Treatment: Gait belt Activity Tolerance: Patient tolerated treatment well Patient left: in chair;with call bell/phone within reach;with chair alarm set;with nursing/sitter in room Nurse Communication: Mobility status Pain - Right/Left: Left Pain - part of body: Knee     Time: [id number] PT Time Calculation (min) (ACUTE ONLY): 38 min  Charges:  $Gait Training: 8-22 mins $Therapeutic Exercise: 8-22 mins $Therapeutic Activity: 8-22 mins                    Chesley Noon, PTA 12/14/19, 10:53 AM

## 2019-12-14 NOTE — Plan of Care (Signed)

## 2019-12-14 NOTE — Progress Notes (Signed)
1        [facility] [place] NAME: Michelle Jordan    MR#:  [id number]  DATE OF BIRTH:  [birth year]  SUBJECTIVE:  CHIEF COMPLAINT:  No chief complaint on file. Much better.  Walking therapy.  Heart rate and blood pressure much better controlled.  Pain is controlled REVIEW OF SYSTEMS:  Review of Systems  Constitutional: Negative for diaphoresis, fever, malaise/fatigue and weight loss.  HENT: Negative for ear discharge, ear pain, hearing loss, nosebleeds, sore throat and tinnitus.   Eyes: Negative for blurred vision and pain.  Respiratory: Negative for cough, hemoptysis, shortness of breath and wheezing.   Cardiovascular: Negative for chest pain, palpitations, orthopnea and leg swelling.  Gastrointestinal: Negative for abdominal pain, blood in stool, constipation, diarrhea, heartburn, nausea and vomiting.  Genitourinary: Negative for dysuria, frequency and urgency.  Musculoskeletal: Positive for joint pain. Negative for back pain and myalgias.  Skin: Negative for itching and rash.  Neurological: Negative for dizziness, tingling, tremors, focal weakness, seizures, weakness and headaches.  Psychiatric/Behavioral: Negative for depression. The patient is not nervous/anxious.    DRUG ALLERGIES:   Allergies  Allergen Reactions  . Etodolac Other (See Comments)    Patient is unsure (cannot remember) reaction type  . Isometheptene-Dichloral-Apap     (Midrin)Caused tingling  . Penicillins Other (See Comments)    Did it involve swelling of the face/tongue/throat, SOB, or low BP? No Did it involve sudden or severe rash/hives, skin peeling, or any reaction on the inside of your mouth or nose? No Did you need to seek medical attention at a hospital or doctor's office? No When did it last happen?DOES NOT HAVE A REACTION TO PENICILLIN (PENICILLIN contraindicated with METHOTREXATE) If all above answers are "NO", may proceed with cephalosporin use. .  . Spironolactone Other  (See Comments)    Gout  . Statins Other (See Comments)    Calf/leg pain  . Amitriptyline Anxiety    moodiness   VITALS:  Blood pressure 133/62, pulse 94, temperature 98.8 F (37.1 C), temperature source Oral, resp. rate 16, height 5\' 4"  (1.626 m), weight (!) 142.4 kg, SpO2 96 %. PHYSICAL EXAMINATION:  Physical Exam HENT:     Head: Normocephalic and atraumatic.  Eyes:     Conjunctiva/sclera: Conjunctivae normal.     Pupils: Pupils are equal, round, and reactive to light.  Neck:     Thyroid: No thyromegaly.     Trachea: No tracheal deviation.  Cardiovascular:     Rate and Rhythm: Normal rate and regular rhythm.     Heart sounds: Normal heart sounds.  Pulmonary:     Effort: Pulmonary effort is normal. No respiratory distress.     Breath sounds: Normal breath sounds. No wheezing.  Chest:     Chest wall: No tenderness.  Abdominal:     General: Bowel sounds are normal. There is no distension.     Palpations: Abdomen is soft.     Tenderness: There is no abdominal tenderness.  Musculoskeletal:        General: Normal range of motion.     Cervical back: Normal range of motion and neck supple.     Comments: Left knee dressing in place and looks clean   Skin:    General: Skin is warm and dry.     Findings: No rash.  Neurological:     Mental Status: She is alert and oriented to person, place, and time.     Cranial Nerves: No cranial nerve  deficit.    LABORATORY PANEL:  Female CBC Recent Labs  Lab 12/14/19 0459  WBC 9.2  HGB 10.9*  HCT 33.5*  PLT 190   ------------------------------------------------------------------------------------------------------------------ Chemistries  Recent Labs  Lab 12/14/19 0459  NA 135  K 3.7  CL 98  CO2 27  GLUCOSE 139*  BUN 10  CREATININE 0.60  CALCIUM 8.3*   RADIOLOGY:  No results found. ASSESSMENT AND PLAN:   Persistent tachycardia Most likely related to uncontrolled post surgical pain -Now resolved  Morbid obesity (BMI  [id number]  Complicates overall prognosis and care  Hypertension/tachycardia Change metoprolol at discharge  Status post left knee total arthroplasty Further management per orthopedics  Patient is being discharged today per primary team.  Stable for discharge medically   All the records are reviewed and case discussed with Care Management/Social Worker. Management plans discussed with the patient, primary team and they are in agreement.  CODE STATUS: Full Code  TOTAL TIME TAKING CARE OF THIS PATIENT: 15 minutes.   More than 50% of the time was spent in counseling/coordination of care: YES    Max Sane M.D on 12/14/2019 at 12:09 PM  [facility]   CC: Primary care physician; Tracie Harrier, MD  Note: This dictation was prepared with Dragon dictation along with smaller phrase technology. Any transcriptional errors that result from this process are unintentional.

## 2019-12-14 NOTE — Progress Notes (Addendum)
Subjective: 3 Days Post-Op Procedure(s) (LRB): LEFT TOTAL KNEE ARTHROPLASTY (Left) Patient reports pain as 7 on 0-10 scale.   States that her pain has improved compared to yesterday. Tachycardia present yesterday and day before, has improved.  HR this AM 94, HR while working with PT in the 110's-120's Denies any CP, SOB, ABD pain.  No palpitations We will continue therapy today.  Plan is to go Home after hospital stay.  Objective: Vital signs in last 24 hours: Temp:  [98.4 F (36.9 C)-99.8 F (37.7 C)] 98.8 F (37.1 C) (03/21 0741) Pulse Rate:  [91-112] 94 (03/21 0741) Resp:  [16-18] 16 (03/20 2319) BP: (117-163)/(50-82) 133/62 (03/21 0741) SpO2:  [90 %-96 %] 96 % (03/21 0741)  Intake/Output from previous day: 03/20 0701 - 03/21 0700 In: 0  Out: 1400 [Urine:1400] Intake/Output this shift: No intake/output data recorded.  Recent Labs    12/11/19 1232 12/12/19 0315 12/13/19 0509 12/14/19 0459  HGB 10.7* 11.1* 11.2* 10.9*   Recent Labs    12/13/19 0509 12/14/19 0459  WBC 9.3 9.2  RBC 3.93 3.77*  HCT 35.7* 33.5*  PLT 192 190   Recent Labs    12/13/19 0509 12/14/19 0459  NA 134* 135  K 4.0 3.7  CL 100 98  CO2 25 27  BUN 8 10  CREATININE 0.54 0.60  GLUCOSE 130* 139*  CALCIUM 8.0* 8.3*   No results for input(s): LABPT, INR in the last 72 hours.  EXAM General - Patient is Alert, Appropriate and Oriented  Heart -Regular rhythm Pulmonary -clear to auscultation bilaterally Extremity - Neurovascular intact Sensation intact distally Intact pulses distally Dorsiflexion/Plantar flexion intact No cellulitis present Compartment soft Dressing - dressing C/D/I and no drainage, Praveena intact without drainage Motor Function - intact, moving foot and toes well on exam.   Past Medical History:  Diagnosis Date  . AC (acromioclavicular) joint bone spurs    feet  . Anxiety   . Complication of anesthesia   . Diverticulosis of colon (without mention of hemorrhage)    . Epicondylitis, lateral    right  . Gout, unspecified   . Headache   . History of shingles   . Hypothyroidism    per pt, was dx in 1978, previosly on Synthroid, but has not taken it in years  . Irritable bowel syndrome   . Migraine headache   . Migraine-cluster headache syndrome   . Morbid obesity ([place])   . Obesity, unspecified   . Osteoarthrosis, unspecified whether generalized or localized, unspecified site    knees, cervical spine  . Other abnormal glucose   . Other and unspecified hyperlipidemia   . Other psoriasis   . Plantar fasciitis   . PONV (postoperative nausea and vomiting)    nausea only with foot surgery in Jan. 2016, otherwise no nausea  . 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 Rosacea   . Shingles   . Stress fracture of right foot   . TMJ (temporomandibular joint disorder)   . Unspecified essential hypertension     Assessment/Plan:   3 Days Post-Op Procedure(s) (LRB): LEFT TOTAL KNEE ARTHROPLASTY (Left) Principal Problem:   Tachycardia Active Problems:   OBESITY NOS   Essential hypertension   Knee joint replacement status, left  Estimated body mass index is 53.9 kg/m as calculated from the following:   Height as of this encounter: 5\' 4"  (1.626 m).   Weight as of this encounter: 142.4 kg. Advance diet Up with therapy   Labs  reviewed this AM, Hg 10.9 this morning. Tachycardia has improved, HR 94 this AM.  SpO2 96% this AM.  On room air.  Denies any chest pain or SOB. Continue to work on BM. Encouraged incentive spirometer, Continue with PT today.  Plan for discharge home with HHPT when able. Will discharge home on Metoprolol, instructed her to follow-up with PCP to discuss continuing this medication. Care management to assist with discharge  DVT Prophylaxis - Lovenox, Foot Pumps and TED hose Weight-Bearing as tolerated to left leg  J. Cameron Proud, PA-C [place] 12/14/2019, 9:17 AM

## 2019-12-14 NOTE — Discharge Summary (Addendum)
Physician Discharge Summary  Patient ID: Michelle Jordan MRN: [id number] DOB/AGE: [birth year] 62 y.o.  Admit date: 12/11/2019 Discharge date: 12/14/2019  Admission Diagnoses:  Knee joint replacement status, left [Z96.652]  Discharge Diagnoses: Patient Active Problem List   Diagnosis Date Noted  . Tachycardia 12/12/2019  . Knee joint replacement status, left 12/11/2019  . S/P total knee replacement 09/22/2015  . Preoperative examination 09/28/2014  . Foot fracture, left 08/18/2014  . Migraine 08/18/2014  . Lipoma 07/28/2011  . Anxiety 07/28/2011  . GOUT, UNSPECIFIED 03/09/2010  . Pain in limb 07/13/2008  . Hyperlipidemia 02/12/2007  . OBESITY NOS 02/12/2007  . IRITIS 02/12/2007  . Essential hypertension 02/12/2007  . DIVERTICULOSIS 02/12/2007  . IBS 02/12/2007  . ROSACEA 02/12/2007  . PSORIASIS NEC 02/12/2007  . OSTEOARTHROSIS NOS, UNSPECIFIED SITE 02/12/2007    Past Medical History:  Diagnosis Date  . AC (acromioclavicular) joint bone spurs    feet  . Anxiety   . Complication of anesthesia   . Diverticulosis of colon (without mention of hemorrhage)   . Epicondylitis, lateral    right  . Gout, unspecified   . Headache   . History of shingles   . Hypothyroidism    per pt, was dx in 1978, previosly on Synthroid, but has not taken it in years  . Irritable bowel syndrome   . Migraine headache   . Migraine-cluster headache syndrome   . Morbid obesity ([place])   . Obesity, unspecified   . Osteoarthrosis, unspecified whether generalized or localized, unspecified site    knees, cervical spine  . Other abnormal glucose   . Other and unspecified hyperlipidemia   . Other psoriasis   . Plantar fasciitis   . PONV (postoperative nausea and vomiting)    nausea only with foot surgery in Jan. 2016, otherwise no nausea  . 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 Rosacea   . Shingles   . Stress fracture of right foot   . TMJ (temporomandibular joint disorder)    . Unspecified essential hypertension      Transfusion: None.   Consultants (if any): Internal Medicine, Dr. Max Sane  Discharged Condition: Improved  Hospital Course: Michelle Jordan is an 64 y.o. female who was admitted 12/11/2019 with a diagnosis of primary osteoarthritis of the left knee and went to the operating room on 12/11/2019 and underwent the above named procedures.    Surgeries: Procedure(s): LEFT TOTAL KNEE ARTHROPLASTY on 12/11/2019 Patient tolerated the surgery well. Taken to PACU where she was stabilized and then transferred to the orthopedic floor.  Started on Lovenox 30mg  q 12 hrs. Foot pumps applied bilaterally at 80 mm. Heels elevated on bed with rolled towels. No evidence of DVT. Negative [place]. Physical therapy started on day #1 for gait training and transfer. OT started day #1 for ADL and assisted devices.  On POD1 the patient developed uncontrolled pain and tachycardia.  Pain medications were adjusted and better control was obtained.  Internal medicine was consulted due to continued tachycardia, likely due to pain.  The patient did have a urinalysis which was negative for UTI.  Metoprolol was started for the patient which helped control elevated HR and BP.  Patient's IV was removed on POD3.  Foley was removed on POD2.  Implants: Medacta GMK sphere system with a 4 left femur, 3T/4I  tibial baseplate with short stem and 12 mm insert, size 1 patella, all components cemented  She was given perioperative antibiotics:  Anti-infectives (From admission, onward)  Start     Dose/Rate Route Frequency Ordered Stop   12/11/19 1400  clindamycin (CLEOCIN) IVPB 900 mg     900 mg 100 mL/hr over 30 Minutes Intravenous Every 6 hours 12/11/19 1213 12/12/19 0623   12/11/19 1213  doxycycline (VIBRAMYCIN) 50 MG capsule 50 mg     50 mg Oral 2 times daily PRN 12/11/19 1213     12/11/19 0617  clindamycin (CLEOCIN) 900 MG/50ML IVPB    Note to Pharmacy: Michelle Jordan   : cabinet  override      12/11/19 0617 12/11/19 0750   12/11/19 0600  clindamycin (CLEOCIN) IVPB 900 mg     900 mg 100 mL/hr over 30 Minutes Intravenous On call to O.R. 12/10/19 2134 12/11/19 0740    .  She was given sequential compression devices, early ambulation, and Lovenox for DVT prophylaxis.  She benefited maximally from the hospital stay and there were no complications.    Recent vital signs:  Vitals:   12/13/19 2319 12/14/19 0741  BP: 117/71 133/62  Pulse: 91 94  Resp: 16   Temp: 98.8 F (37.1 C) 98.8 F (37.1 C)  SpO2: 90% 96%    Recent laboratory studies:  Lab Results  Component Value Date   HGB 10.9 (L) 12/14/2019   HGB 11.2 (L) 12/13/2019   HGB 11.1 (L) 12/12/2019   Lab Results  Component Value Date   WBC 9.2 12/14/2019   PLT 190 12/14/2019   Lab Results  Component Value Date   INR 1.0 12/08/2019   Lab Results  Component Value Date   NA 135 12/14/2019   K 3.7 12/14/2019   CL 98 12/14/2019   CO2 27 12/14/2019   BUN 10 12/14/2019   CREATININE 0.60 12/14/2019   GLUCOSE 139 (H) 12/14/2019    Discharge Medications:   Allergies as of 12/14/2019      Reactions   Etodolac Other (See Comments)   Patient is unsure (cannot remember) reaction type   Isometheptene-dichloral-apap    (Midrin)Caused tingling   Penicillins Other (See Comments)   Did it involve swelling of the face/tongue/throat, SOB, or low BP? No Did it involve sudden or severe rash/hives, skin peeling, or any reaction on the inside of your mouth or nose? No Did you need to seek medical attention at a hospital or doctor's office? No When did it last happen?DOES NOT HAVE A REACTION TO PENICILLIN (PENICILLIN contraindicated with METHOTREXATE) If all above answers are "NO", may proceed with cephalosporin use. Marland Kitchen   Spironolactone Other (See Comments)   Gout   Statins Other (See Comments)   Calf/leg pain   Amitriptyline Anxiety   moodiness      Medication List    STOP taking these medications    meloxicam 15 MG tablet Commonly known as: MOBIC     TAKE these medications   ALPRAZolam 0.5 MG tablet Commonly known as: XANAX Take 1 tablet (0.5 mg total) by mouth at bedtime as needed. for sleep What changed:   how much to take  reasons to take this  additional instructions   clidinium-chlordiazePOXIDE 5-2.5 MG capsule Commonly known as: LIBRAX Take 1 capsule by mouth 2 (two) times daily as needed. What changed: reasons to take this   clobetasol cream 0.05 % Commonly known as: TEMOVATE Apply 1 application topically 2 (two) times daily as needed (psoriasis).   doxycycline 50 MG capsule Commonly known as: VIBRAMYCIN Take 1 capsule by mouth  twice daily as needed as  directed What changed: See  the new instructions.   enoxaparin 30 MG/0.3ML injection Commonly known as: LOVENOX Inject 0.3 mLs (30 mg total) into the skin every 12 (twelve) hours.   folic acid 1 MG tablet Commonly known as: FOLVITE Take 1 mg by mouth at bedtime.   methocarbamol 500 MG tablet Commonly known as: ROBAXIN Take 1 tablet (500 mg total) by mouth every 6 (six) hours as needed for muscle spasms.   methotrexate 2.5 MG tablet Commonly known as: RHEUMATREX Take 15 mg by mouth every Thursday.   metoprolol tartrate 25 MG tablet Commonly known as: LOPRESSOR Take 1 tablet (25 mg total) by mouth 2 (two) times daily.   ondansetron 4 MG tablet Commonly known as: ZOFRAN Take 1 tablet (4 mg total) by mouth every 6 (six) hours as needed for nausea.   Oxycodone HCl 10 MG Tabs Take 1-1.5 tablets (10-15 mg total) by mouth every 4 (four) hours as needed for severe pain (pain score 7-10).   promethazine 25 MG tablet Commonly known as: PHENERGAN Take 1 tablet (25 mg total) by mouth every 6 (six) hours as needed for nausea (with headache).   traMADol 50 MG tablet Commonly known as: ULTRAM Take 1-2 tablets (50-100 mg total) by mouth every 6 (six) hours. What changed:   when to take this  reasons to  take this   zolpidem 5 MG tablet Commonly known as: AMBIEN Take 1 tablet (5 mg total) by mouth at bedtime as needed for sleep.            Durable Medical Equipment  (From admission, onward)         Start     Ordered   12/11/19 1214  DME Walker rolling  Once    Question Answer Comment  Walker: With [place] Wheels   Patient needs a walker to treat with the following condition Knee joint replacement status, left      12/11/19 1213   12/11/19 1214  DME 3 n 1  Once     12/11/19 1213   12/11/19 1214  DME Bedside commode  Once    Question:  Patient needs a bedside commode to treat with the following condition  Answer:  Knee joint replacement status, left   12/11/19 1213         Diagnostic Studies: DG Knee 1-2 Views Left  Result Date: 12/11/2019 CLINICAL DATA:  Status post total knee replacement EXAM: LEFT KNEE - 1-2 VIEW COMPARISON:  05/29/2019 FINDINGS: Status post left knee total arthroplasty with expected overlying postoperative changes. No evidence of perihardware fracture or component malpositioning. IMPRESSION: Status post left knee total arthroplasty. Electronically Signed   By: Eddie Candle M.D.   On: 12/11/2019 10:28   Disposition: Plan for possible d/c home today pending progress with PT.  Follow-up Information    Duanne Guess, PA-C Follow up in 14 day(s).   Specialties: Orthopedic Surgery, Emergency Medicine Why: Staple Removal. Contact information: [place] [address] [phone]          Signed: Judson Roch PA-C 12/14/2019, 10:09 AM

## 2020-07-06 ENCOUNTER — Other Ambulatory Visit: Payer: Self-pay | Admitting: Internal Medicine

## 2020-07-06 DIAGNOSIS — Z1231 Encounter for screening mammogram for malignant neoplasm of breast: Secondary | ICD-10-CM

## 2020-07-22 ENCOUNTER — Other Ambulatory Visit: Payer: Self-pay

## 2020-07-22 ENCOUNTER — Ambulatory Visit
Admission: RE | Admit: 2020-07-22 | Discharge: 2020-07-22 | Disposition: A | Discharge status: Home / Self Care | Payer: Managed Care, Other (non HMO) | Source: Ambulatory Visit | Attending: Internal Medicine | Admitting: Internal Medicine

## 2020-07-22 DIAGNOSIS — Z1231 Encounter for screening mammogram for malignant neoplasm of breast: Secondary | ICD-10-CM | POA: Insufficient documentation

## 2020-12-17 ENCOUNTER — Other Ambulatory Visit: Payer: Self-pay

## 2020-12-17 ENCOUNTER — Ambulatory Visit
Admission: RE | Admit: 2020-12-17 | Discharge: 2020-12-17 | Disposition: A | Discharge status: Home / Self Care | Payer: Managed Care, Other (non HMO) | Source: Ambulatory Visit | Attending: Emergency Medicine | Admitting: Emergency Medicine

## 2020-12-17 VITALS — BP 151/80 | HR 73 | Temp 97.7°F | Resp 14 | Ht 64.0 in | Wt 279.0 lb

## 2020-12-17 DIAGNOSIS — H209 Unspecified iridocyclitis: Secondary | ICD-10-CM | POA: Diagnosis not present

## 2020-12-17 DIAGNOSIS — R220 Localized swelling, mass and lump, head: Secondary | ICD-10-CM | POA: Diagnosis not present

## 2020-12-17 MED ORDER — PREDNISONE 10 MG (21) PO TBPK: ORAL_TABLET | ORAL | 0 refills | Status: DC

## 2020-12-17 NOTE — Discharge Instructions (Addendum)
Take the prednisone according to the package instructions starting tomorrow morning.  Take it each morning with breakfast.  Take over-the-counter cetirizine, 10 mg, daily for any potential allergy component.  Take over-the-counter Pepcid 20 mg twice daily also to cover allergic response.  If you develop any worsening redness to her eye, increasing light sensitivity, or visual changes you need to follow-up with your eye doctor.  Return for reevaluation for any new or worsening symptoms.  If you develop any swelling of your tongue, difficulty swallowing, or difficulty breathing go to the ER for evaluation.

## 2020-12-17 NOTE — ED Provider Notes (Addendum)
[facility]    CSN: [id number] Arrival date & time: 12/17/20  1555      History   Chief Complaint Chief Complaint  Patient presents with  . Sinus Problem  . Eye Problem    HPI Michelle Jordan is a 65 y.o. female.   HPI   65 year old female here for evaluation of multiple complaints.  Patient reports that 5 days ago she developed a right-sided headache with sinus pressure and has had some clear nasal discharge.  3 days ago she had a fever but that resolved.  2 days ago she developed redness to the outer aspect of her right eye and is also complaining of some itching but she denies discharge from the eye or visual changes.  Patient does report mild light sensitivity.  She also noticed last night that she began to have swelling to the right side of her upper lip after using some Carmex and then this morning her upper lip was swollen on both sides to twice is normal size.  That is mostly resolved at this point.  Patient denies sinus pain or ear pressure.  Past Medical History:  Diagnosis Date  . AC (acromioclavicular) joint bone spurs    feet  . Anxiety   . Complication of anesthesia   . Diverticulosis of colon (without mention of hemorrhage)   . Epicondylitis, lateral    right  . Gout, unspecified   . Headache   . History of shingles   . Hypothyroidism    per pt, was dx in 1978, previosly on Synthroid, but has not taken it in years  . Irritable bowel syndrome   . Migraine headache   . Migraine-cluster headache syndrome   . Morbid obesity ([place])   . Obesity, unspecified   . Osteoarthrosis, unspecified whether generalized or localized, unspecified site    knees, cervical spine  . Other abnormal glucose   . Other and unspecified hyperlipidemia   . Other psoriasis   . Plantar fasciitis   . PONV (postoperative nausea and vomiting)    nausea only with foot surgery in Jan. 2016, otherwise no nausea  . 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 Rosacea    . Shingles   . Stress fracture of right foot   . TMJ (temporomandibular joint disorder)   . Unspecified essential hypertension     Patient Active Problem List   Diagnosis Date Noted  . Tachycardia 12/12/2019  . Knee joint replacement status, left 12/11/2019  . S/P total knee replacement 09/22/2015  . Preoperative examination 09/28/2014  . Foot fracture, left 08/18/2014  . Migraine 08/18/2014  . Lipoma 07/28/2011  . Anxiety 07/28/2011  . GOUT, UNSPECIFIED 03/09/2010  . Pain in limb 07/13/2008  . Hyperlipidemia 02/12/2007  . OBESITY NOS 02/12/2007  . IRITIS 02/12/2007  . Essential hypertension 02/12/2007  . DIVERTICULOSIS 02/12/2007  . IBS 02/12/2007  . ROSACEA 02/12/2007  . PSORIASIS NEC 02/12/2007  . OSTEOARTHROSIS NOS, UNSPECIFIED SITE 02/12/2007    Past Surgical History:  Procedure Laterality Date  . ABDOMINAL HYSTERECTOMY    . c-s surgery  2005  . CARPAL TUNNEL RELEASE     bilateral  . CERVICAL DISC SURGERY     C5, C6  . CHOLECYSTECTOMY    . COLONOSCOPY N/A 09/10/2015   Procedure: COLONOSCOPY;  Surgeon: Lollie Sails, MD;  Location: [facility] ENDOSCOPY;  Service: Endoscopy;  Laterality: N/A;  . ESOPHAGOGASTRODUODENOSCOPY     had esophageal stricture and swallowing study  . FOOT FRACTURE  SURGERY Left 09/2014   and bunionectomy  . LAPAROSCOPIC SUPRACERVICAL HYSTERECTOMY  11/06   Fibroid tumor and ovarian cyst (one ovary and cervix left)  . NECK SURGERY  2006  . OOPHORECTOMY  2004   1 ovary removed  . OTHER SURGICAL HISTORY     history of gastric partition for weight loss  . OVARIAN CYST REMOVAL    . STOMACH SURGERY  years ago   for weight loss--ruptured incision  . TOTAL KNEE ARTHROPLASTY Right 09/22/2015   Procedure: TOTAL KNEE ARTHROPLASTY;  Surgeon: Leanor Kail, MD;  Location: [facility] ORS;  Service: Orthopedics;  Laterality: Right;  . TOTAL KNEE ARTHROPLASTY Left 12/11/2019   Procedure: LEFT TOTAL KNEE ARTHROPLASTY;  Surgeon: Hessie Knows, MD;   Location: [facility] ORS;  Service: Orthopedics;  Laterality: Left;    OB History   No obstetric history on file.      Home Medications    Prior to Admission medications   Medication Sig Start Date End Date Taking? Authorizing Provider  ALPRAZolam Duanne Moron) 0.5 MG tablet Take 1 tablet (0.5 mg total) by mouth at bedtime as needed. for sleep Patient taking differently: Take 0.25-0.5 mg by mouth at bedtime as needed for anxiety or sleep. 10/08/15  Yes Tower, Wynelle Fanny, MD  folic acid (FOLVITE) 1 MG tablet Take 1 mg by mouth at bedtime.   Yes [provider]  methocarbamol (ROBAXIN) 500 MG tablet Take 1 tablet (500 mg total) by mouth every 6 (six) hours as needed for muscle spasms. 12/14/19  Yes Lattie Corns, PA-C  methotrexate (RHEUMATREX) 2.5 MG tablet Take 15 mg by mouth every Thursday.  08/09/17  Yes [provider]  predniSONE (STERAPRED UNI-PAK 21 TAB) 10 MG (21) TBPK tablet Take 6 tablets on day 1, 5 tablets day 2, 4 tablets day 3, 3 tablets day 4, 2 tablets day 5, 1 tablet day 6 12/17/20  Yes Margarette Canada, NP  traMADol (ULTRAM) 50 MG tablet Take 1-2 tablets (50-100 mg total) by mouth every 6 (six) hours. 12/14/19  Yes Lattie Corns, PA-C  clidinium-chlordiazePOXIDE (LIBRAX) 2.5-5 MG per capsule Take 1 capsule by mouth 2 (two) times daily as needed. Patient taking differently: Take 1 capsule by mouth 2 (two) times daily as needed (irritable bowel syndrome).  07/28/11   Tower, Wynelle Fanny, MD  clobetasol (TEMOVATE) 0.05 % cream Apply 1 application topically 2 (two) times daily as needed (psoriasis).     [provider]  ondansetron (ZOFRAN) 4 MG tablet Take 1 tablet (4 mg total) by mouth every 6 (six) hours as needed for nausea. 12/14/19   Lattie Corns, PA-C  promethazine (PHENERGAN) 25 MG tablet Take 1 tablet (25 mg total) by mouth every 6 (six) hours as needed for nausea (with headache). 08/18/14   Tower, Wynelle Fanny, MD  zolpidem (AMBIEN) 5 MG tablet Take 1 tablet  (5 mg total) by mouth at bedtime as needed for sleep. 12/14/19   Lattie Corns, PA-C  enoxaparin (LOVENOX) 30 MG/0.3ML injection Inject 0.3 mLs (30 mg total) into the skin every 12 (twelve) hours. 12/14/19 12/17/20  Lattie Corns, PA-C  metoprolol tartrate (LOPRESSOR) 25 MG tablet Take 1 tablet (25 mg total) by mouth 2 (two) times daily. 12/14/19 12/17/20  Lattie Corns, PA-C    Family History Family History  Problem Relation Age of Onset  . Arthritis Mother   . Fibromyalgia Mother   . Other Mother        Severe lymphedema  . Obesity  Mother   . Obesity Brother   . Heart attack Father   . Coronary artery disease Father   . Ovarian cancer Maternal Aunt   . Breast cancer Paternal Aunt   . Coronary artery disease Paternal Uncle        all paternal uncles  . Stroke Other        GF  . Hypertension Other        a lot of family members  . Diabetes Other        GM  . Diabetes Brother     Social History Social History   Tobacco Use  . Smoking status: Never Smoker  . Smokeless tobacco: Never Used  Vaping Use  . Vaping Use: Never used  Substance Use Topics  . Alcohol use: No    Alcohol/week: 0.0 standard drinks  . Drug use: No     Allergies   Etodolac, Isometheptene-dichloral-apap, Penicillins, Spironolactone, Statins, and Amitriptyline   Review of Systems Review of Systems  Constitutional: Positive for fever. Negative for activity change and appetite change.  HENT: Positive for congestion and rhinorrhea. Negative for ear pain, sinus pressure, sinus pain and sore throat.   Eyes: Positive for redness and itching. Negative for pain, discharge and visual disturbance.  Respiratory: Positive for cough. Negative for shortness of breath and wheezing.   Cardiovascular: Negative for chest pain.  Hematological: Negative.   Psychiatric/Behavioral: Negative.      Physical Exam Triage Vital Signs ED Triage Vitals [12/17/20 1608]  Enc Vitals Group     BP      Pulse       Resp      Temp      Temp src      SpO2      Weight 279 lb (126.6 kg)     Height 5\' 4"  (1.626 m)     Head Circumference      Peak Flow      Pain Score 4     Pain Loc      Pain Edu?      Excl. in [place]?    No data found.  Updated Vital Signs BP (!) 151/80 (BP Location: Left Arm)   Pulse 73   Temp 97.7 F (36.5 C) (Oral)   Resp 14   Ht 5\' 4"  (1.626 m)   Wt 279 lb (126.6 kg)   SpO2 100%   BMI 47.89 kg/m   Visual Acuity Right Eye Distance:   Left Eye Distance:   Bilateral Distance:    Right Eye Near:   Left Eye Near:    Bilateral Near:     Physical Exam Vitals and nursing note reviewed.  Constitutional:      General: She is not in acute distress.    Appearance: Normal appearance. She is not ill-appearing.  HENT:     Head: Normocephalic and atraumatic.     Right Ear: Tympanic membrane, ear canal and external ear normal.     Left Ear: Tympanic membrane, ear canal and external ear normal.     Nose: Nose normal. No congestion or rhinorrhea.     Mouth/Throat:     Mouth: Mucous membranes are moist.     Pharynx: Oropharynx is clear. No posterior oropharyngeal erythema.  Eyes:     General: No scleral icterus.       Right eye: No discharge.        Left eye: No discharge.     Extraocular Movements: Extraocular movements intact.  Conjunctiva/sclera: Conjunctivae normal.     Pupils: Pupils are equal, round, and reactive to light.  Cardiovascular:     Rate and Rhythm: Normal rate and regular rhythm.     Pulses: Normal pulses.     Heart sounds: Normal heart sounds. No murmur heard. No gallop.   Pulmonary:     Effort: Pulmonary effort is normal.     Breath sounds: Normal breath sounds. No wheezing, rhonchi or rales.  Skin:    General: Skin is warm and dry.     Capillary Refill: Capillary refill takes less than 2 seconds.     Findings: No erythema.  Neurological:     General: No focal deficit present.     Mental Status: She is alert and oriented to person,  place, and time.  Psychiatric:        Mood and Affect: Mood normal.        Behavior: Behavior normal.        Thought Content: Thought content normal.        Judgment: Judgment normal.      UC Treatments / Results  Labs (all labs ordered are listed, but only abnormal results are displayed) Labs Reviewed - No data to display  EKG   Radiology No results found.  Procedures Procedures (including critical care time)  Medications Ordered in UC Medications - No data to display  Initial Impression / Assessment and Plan / UC Course  I have reviewed the triage vital signs and the nursing notes.  Pertinent labs & imaging results that were available during my care of the patient were reviewed by me and considered in my medical decision making (see chart for details).   Patient is a very pleasant 65 year old female here for evaluation of eye and respiratory complaints that been going on for the last 5 days.  Physical exam reveals some mild swelling to the right side of the patient's face.  There is erythema to the bulbar surface of the right eye on the lateral aspect only.  Palpebral conjunctiva are not injected or edematous.  There is no discharge noted.  Nasal mucosa is pink and moist with out edema.  There is scant clear nasal discharge.  Patient's right cheek is mildly erythematous from just below the inferior orbital rim to the nasolabial fold extending down onto the right aspect of the chin.  The right upper lip is mildly edematous.  Patient's face is symmetrical, cranial nerves II through XII intact.  Posterior oropharynx is unremarkable.  Lungs clear auscultation all fields.  Patient states she does have a history of rosacea, psoriasis, and psoriatic arthritis.  Patient also has a history of iritis.  Patient reports that she has been having a flareup of her psoriasis and psoriatic arthritis and has been using clobetasol.  Etiology of entirety of her symptoms is unclear but I believe it is  inflammatory and may be exacerbation of iritis as well as psoriasis.  Will put patient on a prednisone taper and have her return for new or worsening symptoms.   Final Clinical Impressions(s) / UC Diagnoses   Final diagnoses:  Uveitis of right eye  Right facial swelling     Discharge Instructions     Take the prednisone according to the package instructions starting tomorrow morning.  Take it each morning with breakfast.  Take over-the-counter cetirizine, 10 mg, daily for any potential allergy component.  Take over-the-counter Pepcid 20 mg twice daily also to cover allergic response.  Return for reevaluation for  any new or worsening symptoms.  If you develop any swelling of your tongue, difficulty swallowing, or difficulty breathing go to the ER for evaluation.    ED Prescriptions    Medication Sig Dispense Auth. Provider   predniSONE (STERAPRED UNI-PAK 21 TAB) 10 MG (21) TBPK tablet Take 6 tablets on day 1, 5 tablets day 2, 4 tablets day 3, 3 tablets day 4, 2 tablets day 5, 1 tablet day 6 21 tablet Margarette Canada, NP     PDMP not reviewed this encounter.   Margarette Canada, NP 12/17/20 1640    Margarette Canada, NP 12/17/20 1644

## 2020-12-17 NOTE — ED Triage Notes (Signed)
Patient c/o sinus pressure and headache and right eye irritation and redness that started couple days ago.  Patient reports swelling in her lips that started today.  Patient states that she has been taking OTC Sudafed.  Patient denies fevers.

## 2021-09-17 IMAGING — MG DIGITAL SCREENING BILAT W/ TOMO W/ CAD
6 of 12 series · 6 of 36 positions shown · non-contrast
Comparison: Previous exam(s).

ACR Breast Density Category a: The breast tissue is almost entirely
fatty.

CLINICAL DATA: Screening.

EXAM:
DIGITAL SCREENING BILATERAL MAMMOGRAM WITH TOMO AND CAD

[R MLO synth-2D (1 of 2)]
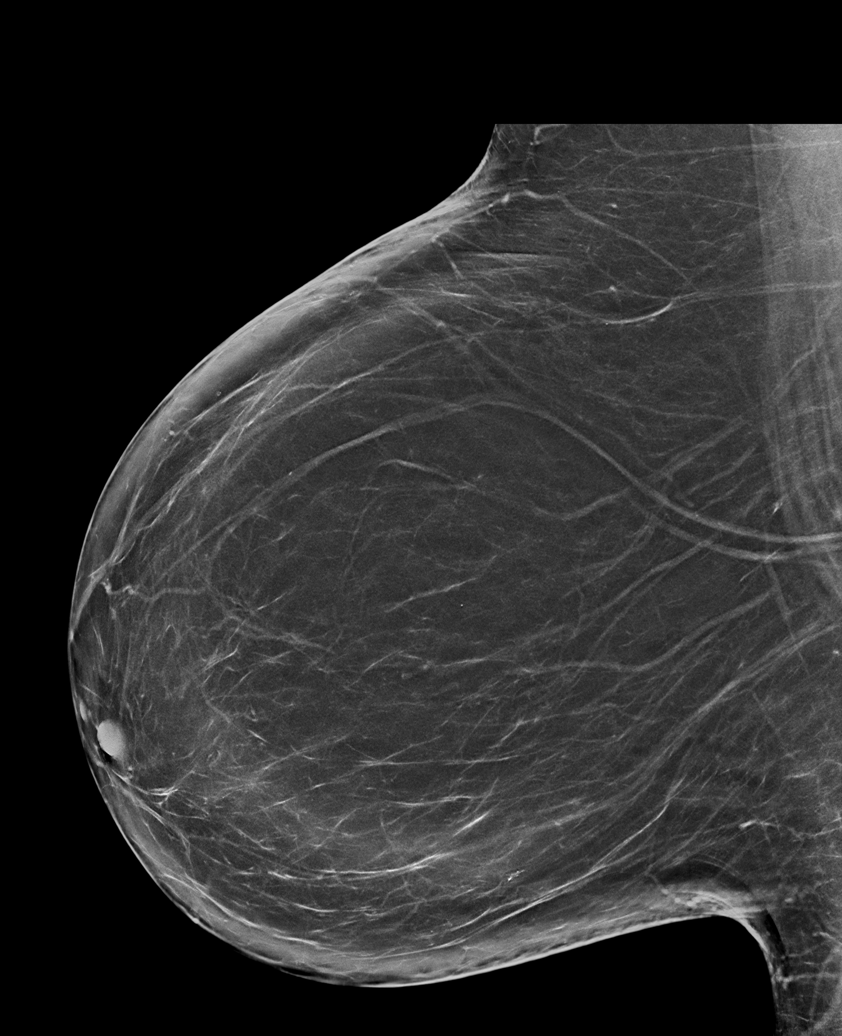

[L CC synth-2D]
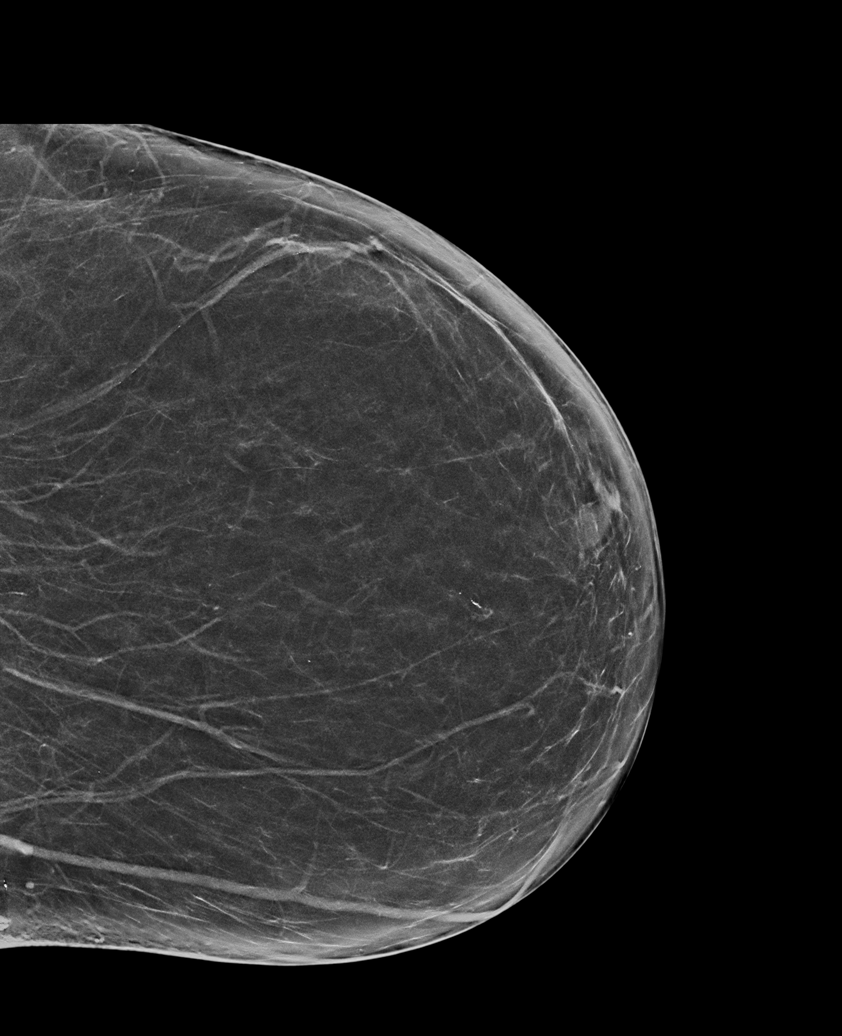

[R CC synth-2D]
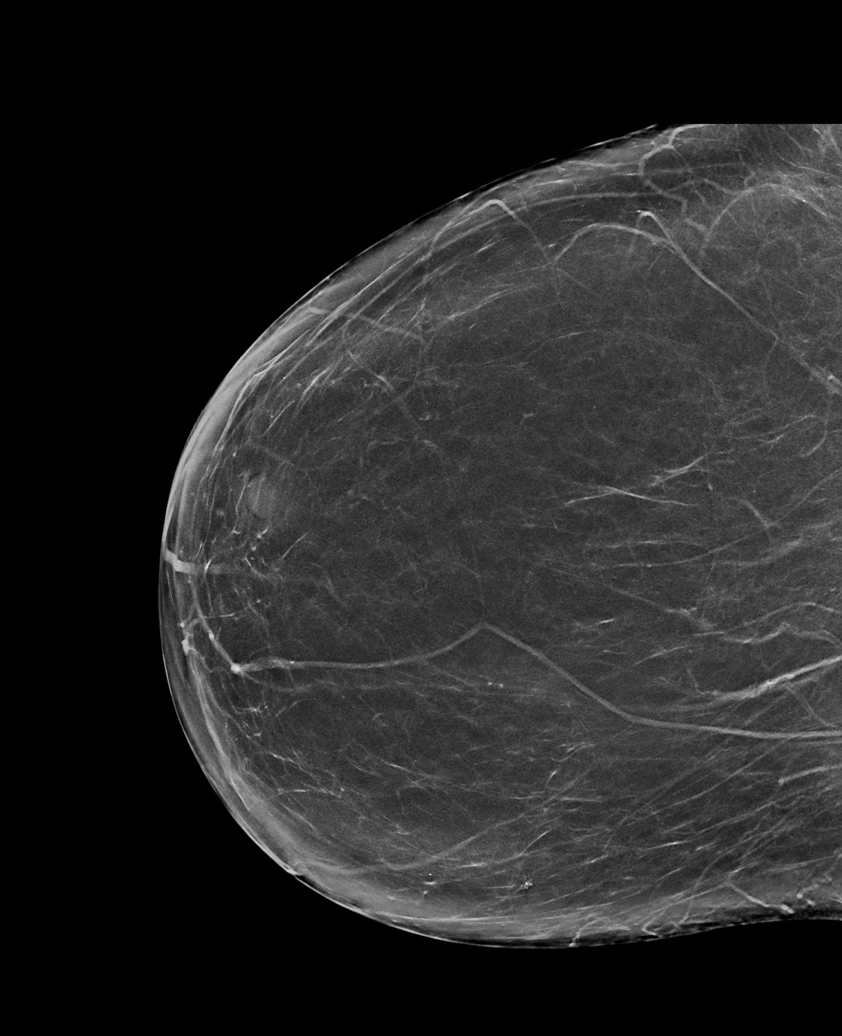

[R MLO synth-2D (2 of 2)]
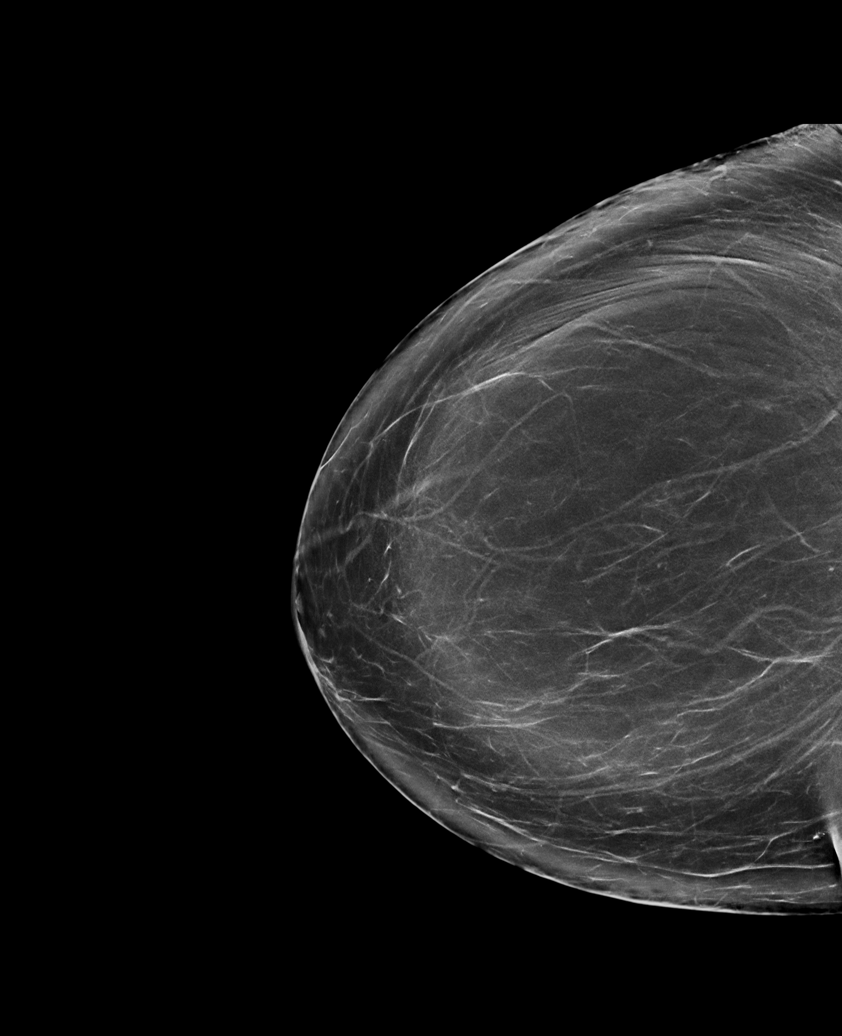

[L MLO synth-2D (1 of 2)]
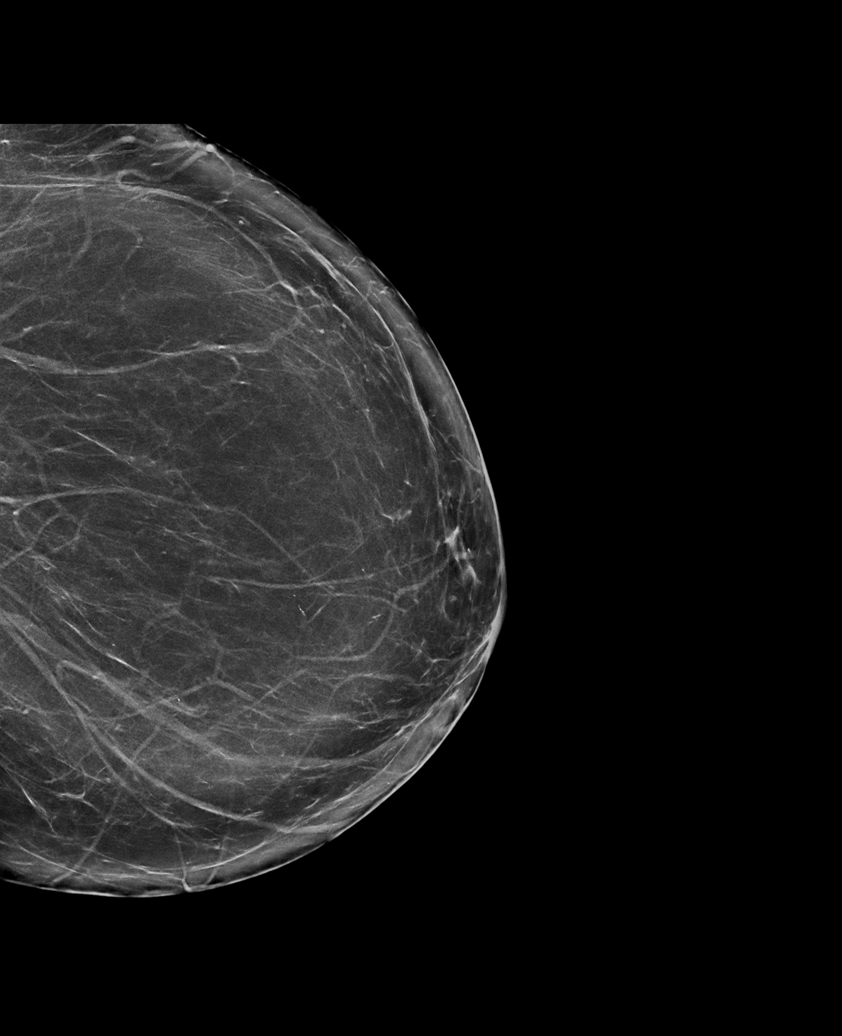

[L MLO synth-2D (2 of 2)]
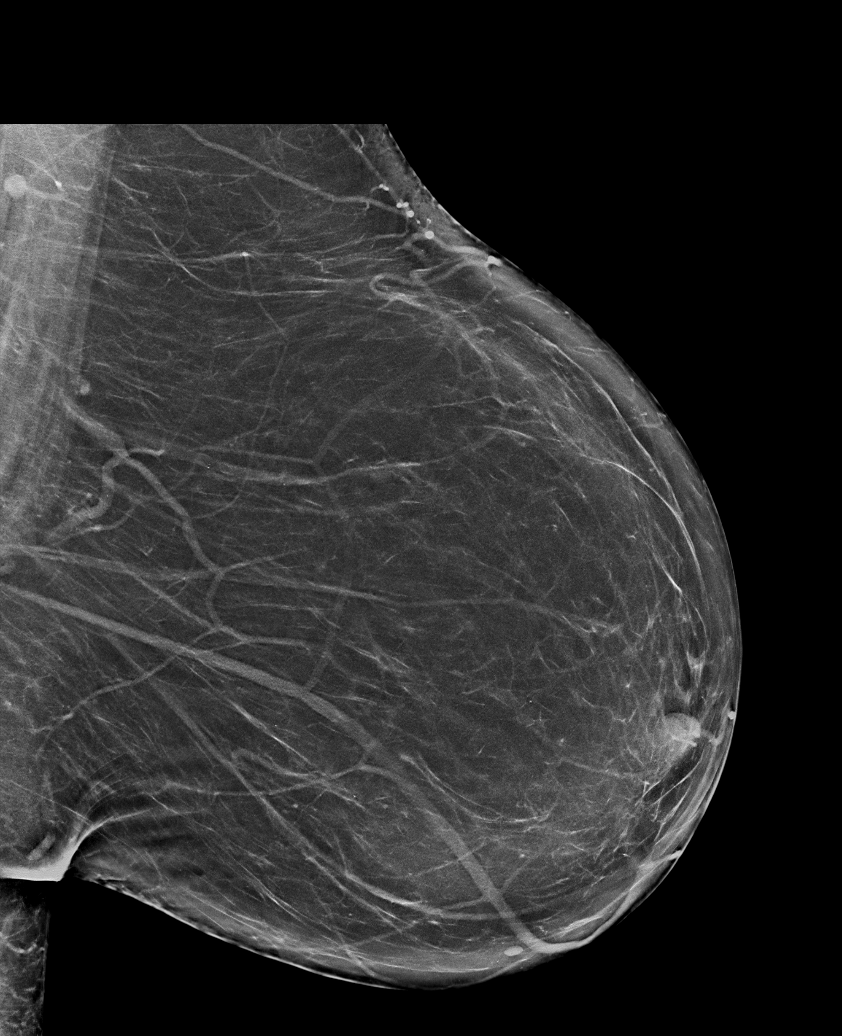

[6 of 36 positions shown; findings below may reference images not displayed]

FINDINGS: There are no findings suspicious for malignancy. Images were
processed with CAD.
IMPRESSION: No mammographic evidence of malignancy. A result letter of this
screening mammogram will be mailed directly to the patient.

RECOMMENDATION:
Screening mammogram in one year. (Code:[id number]-[id number])

BI-RADS CATEGORY  1: Negative.

## 2021-12-13 DIAGNOSIS — L4050 Arthropathic psoriasis, unspecified: Secondary | ICD-10-CM | POA: Diagnosis not present

## 2021-12-13 DIAGNOSIS — Z79899 Other long term (current) drug therapy: Secondary | ICD-10-CM | POA: Diagnosis not present

## 2021-12-13 DIAGNOSIS — H2010 Chronic iridocyclitis, unspecified eye: Secondary | ICD-10-CM | POA: Diagnosis not present

## 2022-02-06 DIAGNOSIS — M79672 Pain in left foot: Secondary | ICD-10-CM | POA: Diagnosis not present

## 2022-02-06 DIAGNOSIS — L4050 Arthropathic psoriasis, unspecified: Secondary | ICD-10-CM | POA: Diagnosis not present

## 2022-02-06 DIAGNOSIS — S92502A Displaced unspecified fracture of left lesser toe(s), initial encounter for closed fracture: Secondary | ICD-10-CM | POA: Diagnosis not present

## 2022-02-21 DIAGNOSIS — L4050 Arthropathic psoriasis, unspecified: Secondary | ICD-10-CM | POA: Diagnosis not present

## 2022-02-21 DIAGNOSIS — Z0000 Encounter for general adult medical examination without abnormal findings: Secondary | ICD-10-CM | POA: Diagnosis not present

## 2022-02-21 DIAGNOSIS — Z6841 Body Mass Index (BMI) 40.0 and over, adult: Secondary | ICD-10-CM | POA: Diagnosis not present

## 2022-02-21 DIAGNOSIS — M159 Polyosteoarthritis, unspecified: Secondary | ICD-10-CM | POA: Diagnosis not present

## 2022-02-21 DIAGNOSIS — G4700 Insomnia, unspecified: Secondary | ICD-10-CM | POA: Diagnosis not present

## 2022-02-21 DIAGNOSIS — E7800 Pure hypercholesterolemia, unspecified: Secondary | ICD-10-CM | POA: Diagnosis not present

## 2022-02-21 DIAGNOSIS — Z789 Other specified health status: Secondary | ICD-10-CM | POA: Diagnosis not present

## 2022-02-25 ENCOUNTER — Emergency Department
Admission: EM | Admit: 2022-02-25 | Discharge: 2022-02-25 | Disposition: A | Discharge status: Home / Self Care | Payer: 59 | Attending: Emergency Medicine | Admitting: Emergency Medicine

## 2022-02-25 ENCOUNTER — Emergency Department: Payer: 59

## 2022-02-25 ENCOUNTER — Other Ambulatory Visit: Payer: Self-pay

## 2022-02-25 ENCOUNTER — Encounter: Payer: Self-pay | Admitting: Emergency Medicine

## 2022-02-25 DIAGNOSIS — M4316 Spondylolisthesis, lumbar region: Secondary | ICD-10-CM | POA: Diagnosis not present

## 2022-02-25 DIAGNOSIS — E039 Hypothyroidism, unspecified: Secondary | ICD-10-CM | POA: Insufficient documentation

## 2022-02-25 DIAGNOSIS — W1839XA Other fall on same level, initial encounter: Secondary | ICD-10-CM | POA: Diagnosis not present

## 2022-02-25 DIAGNOSIS — S32018A Other fracture of first lumbar vertebra, initial encounter for closed fracture: Secondary | ICD-10-CM | POA: Diagnosis not present

## 2022-02-25 DIAGNOSIS — Y93H2 Activity, gardening and landscaping: Secondary | ICD-10-CM | POA: Diagnosis not present

## 2022-02-25 DIAGNOSIS — S32010A Wedge compression fracture of first lumbar vertebra, initial encounter for closed fracture: Secondary | ICD-10-CM

## 2022-02-25 DIAGNOSIS — S79919A Unspecified injury of unspecified hip, initial encounter: Secondary | ICD-10-CM | POA: Diagnosis not present

## 2022-02-25 DIAGNOSIS — S3992XA Unspecified injury of lower back, initial encounter: Secondary | ICD-10-CM | POA: Diagnosis not present

## 2022-02-25 DIAGNOSIS — I10 Essential (primary) hypertension: Secondary | ICD-10-CM | POA: Insufficient documentation

## 2022-02-25 DIAGNOSIS — S3993XA Unspecified injury of pelvis, initial encounter: Secondary | ICD-10-CM | POA: Diagnosis not present

## 2022-02-25 MED ORDER — OXYCODONE-ACETAMINOPHEN 5-325 MG PO TABS
1.0000 | ORAL_TABLET | ORAL | Status: DC | PRN
  Administered 2022-02-25: 1 via ORAL
  Filled 2022-02-25: qty 1

## 2022-02-25 MED ORDER — OXYCODONE-ACETAMINOPHEN 5-325 MG PO TABS: 1.0000 | ORAL_TABLET | Freq: Four times a day (QID) | ORAL | 0 refills | Status: DC | PRN

## 2022-02-25 MED ORDER — OXYCODONE-ACETAMINOPHEN 5-325 MG PO TABS
1.0000 | ORAL_TABLET | Freq: Once | ORAL | Status: AC
  Administered 2022-02-25: 1 via ORAL
  Filled 2022-02-25: qty 1

## 2022-02-25 MED ORDER — OXYCODONE HCL 5 MG PO TABS
10.0000 mg | ORAL_TABLET | Freq: Once | ORAL | Status: AC
  Administered 2022-02-25: 10 mg via ORAL
  Filled 2022-02-25: qty 2

## 2022-02-25 NOTE — ED Provider Notes (Signed)
[facility] Provider Note    Event Date/Time   First MD Initiated Contact with Patient 02/25/22 1834     (approximate)   History   Chief Complaint Back Pain   HPI Michelle Jordan is a 66 y.o. female, history of hyperlipidemia, gout, hypertension, anxiety, hypothyroidism, obesity, presents to the emergency department for evaluation of back pain.  Patient states that she was working outside and pulling weeds when she pulled a thick one that was not coming on the ground.  When it finally gave way, she fell backwards and landed on her buttocks.  She is now currently endorsing pain in her lower back.  She is still able to ambulate, though with moderate pain.  Denies fever/chills, numbness/tingling upper or lower extremities, bowel/bladder dysfunction, nausea/vomiting, head injury, chest pain, shortness of breath, or dizziness/lightheadedness.  History Limitations: No limitations        Physical Exam  Triage Vital Signs: ED Triage Vitals  Enc Vitals Group     BP 02/25/22 1749 (!) 169/100     Pulse Rate 02/25/22 1749 87     Resp 02/25/22 1749 20     Temp 02/25/22 1749 97.9 F (36.6 C)     Temp Source 02/25/22 1749 Oral     SpO2 02/25/22 1749 94 %     Weight 02/25/22 1730 277 lb 12.5 oz (126 kg)     Height 02/25/22 1730 '5\' 4"'$  (1.626 m)     Head Circumference --      Peak Flow --      Pain Score --      Pain Loc --      Pain Edu? --      Excl. in [place]? --     Most recent vital signs: Vitals:   02/25/22 1749  BP: (!) 169/100  Pulse: 87  Resp: 20  Temp: 97.9 F (36.6 C)  SpO2: 94%    General: Awake, NAD.  Skin: Warm, dry. No rashes or lesions.  Eyes: PERRL. Conjunctivae normal.  CV: Good peripheral perfusion.  Resp: Normal effort.  Abd: Soft, non-tender. No distention.  Neuro: At baseline. No gross neurological deficits.   Focused Exam: Patient is able to ambulate on her own.  There is moderate midline spinal tenderness around the L1-L2  region.  Pulse, motor, sensation intact distally in both lower extremities  Physical Exam    ED Results / Procedures / Treatments  Labs (all labs ordered are listed, but only abnormal results are displayed) Labs Reviewed - No data to display   EKG N/A.   RADIOLOGY  ED Provider Interpretation: I personally reviewed and interpreted this CT.  Compression fracture involving the superior endplate of L1 present.  CT Lumbar Spine Wo Contrast  Result Date: 02/25/2022 CLINICAL DATA:  Initial evaluation for acute trauma. EXAM: CT LUMBAR SPINE WITHOUT CONTRAST TECHNIQUE: Multidetector CT imaging of the lumbar spine was performed without intravenous contrast administration. Multiplanar CT image reconstructions were also generated. RADIATION DOSE REDUCTION: This exam was performed according to the departmental dose-optimization program which includes automated exposure control, adjustment of the mA and/or kV according to patient size and/or use of iterative reconstruction technique. COMPARISON:  None Available. FINDINGS: Segmentation: Standard. Lowest well-formed disc space labeled the L5-S1 level. Alignment: Mild levo scoliosis. Trace degenerative retrolisthesis of L2 on L3 and L3 on L4. Vertebrae: Acute compression fracture involving the superior endplate of L1 with mild 20% height loss and no more than trace 2 mm bony retropulsion. No significant spinal  stenosis. No other acute or chronic fracture. Visualized sacrum and pelvis intact. No discrete or worrisome osseous lesions. Paraspinal and other soft tissues: Mild paraspinous edema adjacent to the L1 compression fracture. Paraspinous soft tissues demonstrate no other acute finding. Aortic atherosclerosis. Colonic diverticulosis noted. Disc levels: Moderate degenerative disc disease with disc bulge and disc desiccation and reactive endplate change at O6-7 through L4-5. Moderate to advanced lower lumbar facet hypertrophy. IMPRESSION: 1. Acute compression  fracture involving the superior endplate of L1 with mild 20% height loss and trace 2 mm bony retropulsion, but no significant spinal stenosis. 2. No other acute traumatic injury within the lumbar spine. 3. Underlying moderate degenerative spondylosis at L2-3 through L4-5, with moderate to advanced lower lumbar facet hypertrophy. 4. Colonic diverticulosis. 5. Aortic Atherosclerosis (ICD10-I70.0). Electronically Signed   By: Jeannine Boga M.D.   On: 02/25/2022 19:31   CT PELVIS WO CONTRAST  Result Date: 02/25/2022 CLINICAL DATA:  Hip trauma EXAM: CT PELVIS WITHOUT CONTRAST TECHNIQUE: Multidetector CT imaging of the pelvis was performed following the standard protocol without intravenous contrast. RADIATION DOSE REDUCTION: This exam was performed according to the departmental dose-optimization program which includes automated exposure control, adjustment of the mA and/or kV according to patient size and/or use of iterative reconstruction technique. COMPARISON:  CT 01/17/2012 FINDINGS: Urinary Tract:  No abnormality visualized. Bowel: No acute bowel wall thickening. Diverticular disease of the sigmoid colon Vascular/Lymphatic: No suspicious lymph nodes. Aortic atherosclerosis Reproductive:  Uterus unremarkable.  No adnexal mass Other:  Negative for pelvic effusion Musculoskeletal: No acute fracture or malalignment. IMPRESSION: 1. No acute osseous abnormality 2. Diverticular disease of the sigmoid colon without acute wall thickening Electronically Signed   By: Donavan Foil M.D.   On: 02/25/2022 19:27    PROCEDURES:  Critical Care performed: N/A.  Procedures    MEDICATIONS ORDERED IN ED: Medications  oxyCODONE-acetaminophen (PERCOCET/ROXICET) 5-325 MG per tablet 1 tablet (1 tablet Oral Given 02/25/22 1752)  oxyCODONE-acetaminophen (PERCOCET/ROXICET) 5-325 MG per tablet 1 tablet (1 tablet Oral Given 02/25/22 1859)  oxyCODONE (Oxy IR/ROXICODONE) immediate release tablet 10 mg (10 mg Oral Given 02/25/22  2134)     IMPRESSION / MDM / ASSESSMENT AND PLAN / ED COURSE  I reviewed the triage vital signs and the nursing notes.                              Differential diagnosis includes, but is not limited to, lumbosacral strain, lumbar vertebral fracture, compression fracture, herniated disc.   ED Course Patient appears well, currently nursing moderate pain in the lower back region.  We will go ahead treat with oxycodone/acetaminophen.  Assessment/Plan Patient presents with back pain secondary to mechanical fall earlier today.  She is found to have a acute compression fracture of the superior endplate of L1 with 67% vertebral height loss and trace 2 mm bony retropulsion.  Patient still able to ambulate on her own.  Spoke with the on-call neurosurgeon, Dr. Tamala Julian, who advised that he can follow-up with the patient on an outpatient basis.  Offered the patient TSO bracing, however she states that she is satisfied with her current back brace that she has.  We will provide patient with short-term supply of oxycodone/acetaminophen.  We will plan to discharge.  Patient's presentation is most consistent with acute complicated illness / injury requiring diagnostic workup.   Provided the patient with anticipatory guidance, return precautions, and educational material. Encouraged the patient to return to  the emergency department at any time if they begin to experience any new or worsening symptoms. Patient expressed understanding and agreed with the plan.       FINAL CLINICAL IMPRESSION(S) / ED DIAGNOSES   Final diagnoses:  Compression fracture of L1 vertebra, initial encounter ([place])     Rx / DC Orders   ED Discharge Orders          Ordered    oxyCODONE-acetaminophen (PERCOCET/ROXICET) 5-325 MG tablet  Every 6 hours PRN        02/25/22 2201             Note:  This document was prepared using Dragon voice recognition software and may include unintentional dictation errors.   Teodoro Spray, [place] 02/25/22 2206    Rada Hay, MD 02/26/22 [phone]

## 2022-02-25 NOTE — ED Triage Notes (Signed)
Pt reports was working outside pulling weeds and she was pulling on a thick one that she could not get to come out of the ground. Pt reports it finally gave way and she fell backwards hurting her lower back. Pt reports pain across her lower back worse on the right side. Denies loss of bowel or bladder.

## 2022-02-25 NOTE — Discharge Instructions (Addendum)
-  Follow-up with the neurosurgeon listed above and schedule appointment tomorrow.  -You may keep the brace on for comfort.  -You may take ibuprofen as needed for pain.  You may additionally take oxycodone/acetaminophen, though do not take more than the recommended amount.  -Return to the emergency department anytime if you begin to experience any new or worsening symptoms

## 2022-02-28 DIAGNOSIS — M5441 Lumbago with sciatica, right side: Secondary | ICD-10-CM | POA: Diagnosis not present

## 2022-02-28 DIAGNOSIS — W19XXXD Unspecified fall, subsequent encounter: Secondary | ICD-10-CM | POA: Diagnosis not present

## 2022-02-28 DIAGNOSIS — S32019D Unspecified fracture of first lumbar vertebra, subsequent encounter for fracture with routine healing: Secondary | ICD-10-CM | POA: Diagnosis not present

## 2022-02-28 DIAGNOSIS — W1839XD Other fall on same level, subsequent encounter: Secondary | ICD-10-CM | POA: Diagnosis not present

## 2022-03-03 ENCOUNTER — Other Ambulatory Visit: Payer: Self-pay | Admitting: Physician Assistant

## 2022-03-03 DIAGNOSIS — S32019D Unspecified fracture of first lumbar vertebra, subsequent encounter for fracture with routine healing: Secondary | ICD-10-CM

## 2022-03-09 DIAGNOSIS — S32010B Wedge compression fracture of first lumbar vertebra, initial encounter for open fracture: Secondary | ICD-10-CM | POA: Diagnosis not present

## 2022-03-16 ENCOUNTER — Ambulatory Visit: Payer: 59

## 2022-03-24 ENCOUNTER — Ambulatory Visit: Admission: RE | Admit: 2022-03-24 | Payer: 59 | Source: Ambulatory Visit

## 2022-03-30 DIAGNOSIS — E7849 Other hyperlipidemia: Secondary | ICD-10-CM | POA: Diagnosis not present

## 2022-03-30 DIAGNOSIS — Z789 Other specified health status: Secondary | ICD-10-CM | POA: Diagnosis not present

## 2022-03-30 DIAGNOSIS — R69 Illness, unspecified: Secondary | ICD-10-CM | POA: Diagnosis not present

## 2022-03-30 DIAGNOSIS — M2578 Osteophyte, vertebrae: Secondary | ICD-10-CM | POA: Diagnosis not present

## 2022-03-30 DIAGNOSIS — Z9181 History of falling: Secondary | ICD-10-CM | POA: Diagnosis not present

## 2022-03-30 DIAGNOSIS — Z6841 Body Mass Index (BMI) 40.0 and over, adult: Secondary | ICD-10-CM | POA: Diagnosis not present

## 2022-03-30 DIAGNOSIS — Z96652 Presence of left artificial knee joint: Secondary | ICD-10-CM | POA: Diagnosis not present

## 2022-03-30 DIAGNOSIS — M47816 Spondylosis without myelopathy or radiculopathy, lumbar region: Secondary | ICD-10-CM | POA: Diagnosis not present

## 2022-03-30 DIAGNOSIS — S32010A Wedge compression fracture of first lumbar vertebra, initial encounter for closed fracture: Secondary | ICD-10-CM | POA: Diagnosis not present

## 2022-03-30 DIAGNOSIS — M419 Scoliosis, unspecified: Secondary | ICD-10-CM | POA: Diagnosis not present

## 2022-03-30 DIAGNOSIS — M1711 Unilateral primary osteoarthritis, right knee: Secondary | ICD-10-CM | POA: Diagnosis not present

## 2022-04-12 DIAGNOSIS — M159 Polyosteoarthritis, unspecified: Secondary | ICD-10-CM | POA: Diagnosis not present

## 2022-04-12 DIAGNOSIS — M8588 Other specified disorders of bone density and structure, other site: Secondary | ICD-10-CM | POA: Diagnosis not present

## 2022-04-12 DIAGNOSIS — H2010 Chronic iridocyclitis, unspecified eye: Secondary | ICD-10-CM | POA: Diagnosis not present

## 2022-04-12 DIAGNOSIS — Z8781 Personal history of (healed) traumatic fracture: Secondary | ICD-10-CM | POA: Diagnosis not present

## 2022-04-12 DIAGNOSIS — Z79899 Other long term (current) drug therapy: Secondary | ICD-10-CM | POA: Diagnosis not present

## 2022-04-12 DIAGNOSIS — L4050 Arthropathic psoriasis, unspecified: Secondary | ICD-10-CM | POA: Diagnosis not present

## 2022-04-13 ENCOUNTER — Telehealth: Payer: Self-pay

## 2022-04-13 NOTE — Telephone Encounter (Signed)
Patient declined referral. She is going to [facility] on Monday.

## 2022-04-13 NOTE — Telephone Encounter (Signed)
Please get the patient scheduled in 3 weeks to see Danielle for a L1 compression fracture and xrays prior to her appt. Also please call Marlowe Kays at [facility] and let her know when the appt is.

## 2022-04-17 DIAGNOSIS — S32010A Wedge compression fracture of first lumbar vertebra, initial encounter for closed fracture: Secondary | ICD-10-CM | POA: Diagnosis not present

## 2022-04-17 DIAGNOSIS — Z6841 Body Mass Index (BMI) 40.0 and over, adult: Secondary | ICD-10-CM | POA: Diagnosis not present

## 2022-06-05 DIAGNOSIS — D2272 Melanocytic nevi of left lower limb, including hip: Secondary | ICD-10-CM | POA: Diagnosis not present

## 2022-06-05 DIAGNOSIS — D485 Neoplasm of uncertain behavior of skin: Secondary | ICD-10-CM | POA: Diagnosis not present

## 2022-06-05 DIAGNOSIS — L578 Other skin changes due to chronic exposure to nonionizing radiation: Secondary | ICD-10-CM | POA: Diagnosis not present

## 2022-06-19 DIAGNOSIS — S32010A Wedge compression fracture of first lumbar vertebra, initial encounter for closed fracture: Secondary | ICD-10-CM | POA: Diagnosis not present

## 2022-06-20 DIAGNOSIS — R399 Unspecified symptoms and signs involving the genitourinary system: Secondary | ICD-10-CM | POA: Diagnosis not present

## 2022-06-20 DIAGNOSIS — N3090 Cystitis, unspecified without hematuria: Secondary | ICD-10-CM | POA: Diagnosis not present

## 2022-06-23 ENCOUNTER — Other Ambulatory Visit: Payer: Self-pay | Admitting: Neurosurgery

## 2022-06-23 DIAGNOSIS — M5416 Radiculopathy, lumbar region: Secondary | ICD-10-CM

## 2022-07-05 ENCOUNTER — Ambulatory Visit
Admission: RE | Admit: 2022-07-05 | Discharge: 2022-07-05 | Disposition: A | Discharge status: Home / Self Care | Payer: 59 | Source: Ambulatory Visit | Attending: Neurosurgery | Admitting: Neurosurgery

## 2022-07-05 DIAGNOSIS — M5416 Radiculopathy, lumbar region: Secondary | ICD-10-CM | POA: Insufficient documentation

## 2022-08-10 ENCOUNTER — Other Ambulatory Visit: Payer: Self-pay | Admitting: Internal Medicine

## 2022-08-10 ENCOUNTER — Other Ambulatory Visit: Payer: Self-pay

## 2022-08-10 ENCOUNTER — Other Ambulatory Visit: Payer: 59

## 2022-08-10 ENCOUNTER — Ambulatory Visit
Admission: RE | Admit: 2022-08-10 | Discharge: 2022-08-10 | Disposition: A | Discharge status: Home / Self Care | Payer: 59 | Source: Ambulatory Visit | Attending: Internal Medicine | Admitting: Internal Medicine

## 2022-08-10 DIAGNOSIS — R1031 Right lower quadrant pain: Secondary | ICD-10-CM

## 2022-08-10 MED ORDER — IOPAMIDOL (ISOVUE-300) INJECTION 61%
100.0000 mL | Freq: Once | INTRAVENOUS | Status: AC | PRN
  Administered 2022-08-10: 100 mL via INTRAVENOUS

## 2022-10-23 ENCOUNTER — Other Ambulatory Visit: Payer: Self-pay | Admitting: Internal Medicine

## 2022-10-23 DIAGNOSIS — Z1231 Encounter for screening mammogram for malignant neoplasm of breast: Secondary | ICD-10-CM

## 2022-10-25 ENCOUNTER — Ambulatory Visit
Admission: RE | Admit: 2022-10-25 | Discharge: 2022-10-25 | Disposition: A | Discharge status: Home / Self Care | Payer: 59 | Source: Ambulatory Visit | Attending: Internal Medicine | Admitting: Internal Medicine

## 2022-10-25 DIAGNOSIS — Z1231 Encounter for screening mammogram for malignant neoplasm of breast: Secondary | ICD-10-CM | POA: Diagnosis not present

## 2023-09-28 ENCOUNTER — Inpatient Hospital Stay
Admission: RE | Admit: 2023-09-28 | Discharge: 2023-09-28 | Disposition: A | Discharge status: Home / Self Care | Payer: Self-pay | Source: Ambulatory Visit | Attending: Physician Assistant | Admitting: Physician Assistant

## 2023-09-28 ENCOUNTER — Other Ambulatory Visit: Payer: Self-pay

## 2023-09-28 DIAGNOSIS — Z049 Encounter for examination and observation for unspecified reason: Secondary | ICD-10-CM

## 2023-10-02 ENCOUNTER — Other Ambulatory Visit: Payer: Self-pay | Admitting: Internal Medicine

## 2023-10-02 DIAGNOSIS — Z1231 Encounter for screening mammogram for malignant neoplasm of breast: Secondary | ICD-10-CM

## 2023-10-02 NOTE — Progress Notes (Signed)
 Referring Physician:  Lorelle Hussar, MD [address] [place],  [place] [address]  Primary Physician:  Sadie Manna, MD  History of Present Illness: 10/03/2023 [name] is here today with a chief complaint of back and hip pain.  She was previously seen by me for a L1 fracture years ago.  She states her biggest complaint is hip pain that radiates to her groin.  She adds that sometimes this extends up into her lower back and as a result it prevents her from standing for prolonged period of time.  She denies any radiation from her back to her legs with the exception of 1 previous sciatic attack.  She also denies any numbness and tingling in bilateral lower extremities.  She has relief when she sits down or lays down.  She uses a cane and rollator to ambulate more easily.  She notes significant relief with previous hip injection and very little relief with her previous lumbar injections last year.    Injections:   07/12/23: Right hip injection (95% relief) 06/29/23: Bilateral L3 and L4 medial branch and L5 dorsal ramus blocks(no significant relief during the anesthetic phase) 06/07/23: Bilateral L3-4 TF ESI (temporary relief) 04/30/23: Bilateral L4-5 TF ESI (50% relief) 04/13/23: Left hip injection (85%  relief)   Review of Systems:  A 10 point review of systems is negative, except for the pertinent positives and negatives detailed in the HPI.  Past Medical History: Past Medical History:  Diagnosis Date   AC (acromioclavicular) joint bone spurs    feet   Anxiety    Complication of anesthesia    Diverticulosis of colon (without mention of hemorrhage)    Epicondylitis, lateral    right   Gout, unspecified    Headache    History of shingles    Hypothyroidism    per pt, was dx in 1978, previosly on Synthroid, but has not taken it in years   Irritable bowel syndrome    Migraine headache    Migraine-cluster headache syndrome    Morbid obesity (HCC)    Obesity,  unspecified    Osteoarthrosis, unspecified whether generalized or localized, unspecified site    knees, cervical spine   Other abnormal glucose    Other and unspecified hyperlipidemia    Other psoriasis    Plantar fasciitis    PONV (postoperative nausea and vomiting)    nausea only with foot surgery in Jan. 2016, otherwise no nausea   Psoriatic arthritis mutilans (HCC)    methotrexate    Rosacea    Shingles    Stress fracture of right foot    TMJ (temporomandibular joint disorder)    Unspecified essential hypertension     Past Surgical History: Past Surgical History:  Procedure Laterality Date   ABDOMINAL HYSTERECTOMY     c-s surgery  2005   CARPAL TUNNEL RELEASE     bilateral   CERVICAL DISC SURGERY     C5, C6   CHOLECYSTECTOMY     COLONOSCOPY N/A 09/10/2015   Procedure: COLONOSCOPY;  Surgeon: Gladis RAYMOND Mariner, MD;  Location: [facility] ENDOSCOPY;  Service: Endoscopy;  Laterality: N/A;   ESOPHAGOGASTRODUODENOSCOPY     had esophageal stricture and swallowing study   FOOT FRACTURE SURGERY Left 09/2014   and bunionectomy   LAPAROSCOPIC SUPRACERVICAL HYSTERECTOMY  11/06   Fibroid tumor and ovarian cyst (one ovary and cervix left)   NECK SURGERY  2006   OOPHORECTOMY  2004   1 ovary removed   OTHER SURGICAL HISTORY  history of gastric partition for weight loss   OVARIAN CYST REMOVAL     STOMACH SURGERY  years ago   for weight loss--ruptured incision   TOTAL KNEE ARTHROPLASTY Right 09/22/2015   Procedure: TOTAL KNEE ARTHROPLASTY;  Surgeon: Helayne Glenn, MD;  Location: [facility] ORS;  Service: Orthopedics;  Laterality: Right;   TOTAL KNEE ARTHROPLASTY Left 12/11/2019   Procedure: LEFT TOTAL KNEE ARTHROPLASTY;  Surgeon: Kathlynn Sharper, MD;  Location: [facility] ORS;  Service: Orthopedics;  Laterality: Left;    Allergies: Allergies as of 10/03/2023 - Review Complete 10/03/2023  Allergen Reaction Noted   Gabapentin Other (See Comments) 07/17/2022   Etodolac Other (See Comments)  09/09/2015   Isometheptene-dichloral-apap  02/12/2007   Penicillins Other (See Comments) 08/18/2019   Spironolactone Other (See Comments) 04/06/2010   Statins Other (See Comments) 08/18/2019   Amitriptyline Anxiety 09/28/2014    Medications: Outpatient Encounter Medications as of 10/03/2023  Medication Sig   adalimumab (HUMIRA) 40 MG/0.4ML pen Inject into the skin.   Biotin  2.5 MG CAPS 1 capsule every day by oral route.   clobetasol  (TEMOVATE ) 0.05 % cream Apply 1 application topically 2 (two) times daily as needed (psoriasis).    dicyclomine (BENTYL) 20 MG tablet Take 1 tablet by mouth every 6 (six) hours.   ezetimibe (ZETIA) 10 MG tablet TAKE 1 TABLET (10 MG TOTAL) BY MOUTH EVERY DAY FOR 180 DAYS   folic acid  (FOLVITE ) 1 MG tablet Take 1 mg by mouth at bedtime.   meloxicam (MOBIC) 15 MG tablet TAKE 1 TABLET BY MOUTH EVERY DAY AS NEEDED FOR PAIN   methotrexate  (RHEUMATREX) 2.5 MG tablet Take 15 mg by mouth every Thursday.    Phentermine -Topiramate  (QSYMIA ) 7.5-46 MG CP24 Take by mouth.   tiZANidine  (ZANAFLEX ) 2 MG tablet Take 2 mg by mouth 3 (three) times daily as needed.   traZODone  (DESYREL ) 100 MG tablet Take by mouth.   Vitamin D, Ergocalciferol, (DRISDOL) 1.25 MG (50000 UNIT) CAPS capsule Take by mouth.   ALPRAZolam  (XANAX ) 0.5 MG tablet Take 1 tablet (0.5 mg total) by mouth at bedtime as needed. for sleep (Patient not taking: Reported on 10/03/2023)   [DISCONTINUED] clidinium-chlordiazePOXIDE  (LIBRAX ) 2.5-5 MG per capsule Take 1 capsule by mouth 2 (two) times daily as needed. (Patient taking differently: Take 1 capsule by mouth 2 (two) times daily as needed (irritable bowel syndrome). )   [DISCONTINUED] enoxaparin  (LOVENOX ) 30 MG/0.3ML injection Inject 0.3 mLs (30 mg total) into the skin every 12 (twelve) hours.   [DISCONTINUED] methocarbamol  (ROBAXIN ) 500 MG tablet Take 1 tablet (500 mg total) by mouth every 6 (six) hours as needed for muscle spasms.   [DISCONTINUED] metoprolol   tartrate (LOPRESSOR ) 25 MG tablet Take 1 tablet (25 mg total) by mouth 2 (two) times daily.   [DISCONTINUED] ondansetron  (ZOFRAN ) 4 MG tablet Take 1 tablet (4 mg total) by mouth every 6 (six) hours as needed for nausea.   [DISCONTINUED] oxyCODONE -acetaminophen  (PERCOCET/ROXICET) 5-325 MG tablet Take 1-2 tablets by mouth every 6 (six) hours as needed for severe pain.   [DISCONTINUED] predniSONE  (STERAPRED UNI-PAK 21 TAB) 10 MG (21) TBPK tablet Take 6 tablets on day 1, 5 tablets day 2, 4 tablets day 3, 3 tablets day 4, 2 tablets day 5, 1 tablet day 6   [DISCONTINUED] promethazine  (PHENERGAN ) 25 MG tablet Take 1 tablet (25 mg total) by mouth every 6 (six) hours as needed for nausea (with headache).   [DISCONTINUED] traMADol  (ULTRAM ) 50 MG tablet Take 1-2 tablets (50-100 mg total) by mouth every  6 (six) hours. (Patient not taking: Reported on 10/03/2023)   [DISCONTINUED] zolpidem  (AMBIEN ) 5 MG tablet Take 1 tablet (5 mg total) by mouth at bedtime as needed for sleep.   No facility-administered encounter medications on file as of 10/03/2023.    Social History: Social History   Tobacco Use   Smoking status: Never   Smokeless tobacco: Never  Vaping Use   Vaping status: Never Used  Substance Use Topics   Alcohol use: No    Alcohol/week: 0.0 standard drinks of alcohol   Drug use: No    Family Medical History: Family History  Problem Relation Age of Onset   Arthritis Mother    Fibromyalgia Mother    Other Mother        Severe lymphedema   Obesity Mother    Obesity Brother    Heart attack Father    Coronary artery disease Father    Ovarian cancer Maternal Aunt    Breast cancer Paternal Aunt    Coronary artery disease Paternal Uncle        all paternal uncles   Stroke Other        GF   Hypertension Other        a lot of family members   Diabetes Other        GM   Diabetes Brother     Physical Examination:   General: Patient is well developed, well nourished, calm, collected, and  in no apparent distress. Attention to examination is appropriate.  Psychiatric: Patient is non-anxious.  Head:  Pupils equal, round, and reactive to light.  ENT:  Oral mucosa appears well hydrated.  Neck:   Supple.    Respiratory: Patient is breathing without any difficulty.  Extremities: No edema.  Vascular: Palpable dorsal pedal pulses.  Skin:   On exposed skin, there are no abnormal skin lesions.  NEUROLOGICAL:     Awake, alert, oriented to person, place, and time.  Speech is clear and fluent. Fund of knowledge is appropriate.   Cranial Nerves: Pupils equal round and reactive to light.  Facial tone is symmetric.  Facial sensation is symmetric.  Mild tenderness to palpation of lumbar paraspinals Strength:  Side Iliopsoas Quads Hamstring PF DF EHL  R 4-4+ 5 5 5 5 5   L 5 5 5 5 5 5    -SLR  Clonus is not present.  Toes are down-going.  Bilateral upper and lower extremity sensation is intact to light touch.    Patient ambulating with rollater  Medical Decision Making  Imaging: MRI 06/2022: IMPRESSION: 1. Similar extent of the subacute compression fracture of the L1 vertebral body compared to the CT from 02/25/2022, with minimal bony retropulsion but no significant spinal canal or neural foraminal stenosis. 2. No other evidence of acute injury in the lumbar spine. 3. Multilevel disc and facet degeneration throughout the lumbar spine as above resulting in up to moderate neural foraminal stenosis on the right at L3-L4 and on the left at L4-L5, and no greater than mild spinal canal stenosis.   I have personally reviewed the images and agree with the above interpretation.  Assessment and Plan: Ms. Klich is a pleasant 68 y.o. female is here today with a chief complaint of back and hip pain.  She was previously seen by me for a L1 fracture years ago.  She states her biggest complaint is hip pain that radiates to her groin.  She adds that sometimes this extends up into  her lower back and as  a result it prevents her from standing for prolonged period of time.  She denies any radiation from her back to her legs with the exception of 1 previous sciatic attack.  She also denies any numbness and tingling in bilateral lower extremities.  She has relief when she sits down or lays down.  She uses a cane and rollator to ambulate more easily.  She notes significant relief with previous hip injection and very little relief with her previous lumbar injections last year.  On examination negative straight leg raise.  Mild weakness in right hip flexion.  No ankle clonus present.  Some mild tenderness to palpation of her lumbar paraspinals.  Previous MRI was reviewed as well as outside x-rays.  Chronic compression fracture of the L1 vertebral body was noted.  Patient also does have some moderate neuroforaminal stenosis in her lumbar spine.  It  was a pleasure to see patient again today.  Per the patient, her hip pain is her biggest source of discomfort.  She is actively trying to lose weight in order to undergo hip replacement surgery soon as possible.  From her symptoms and review of her imaging as well as our discussion I do think this would be a larger benefit to her rather than lumbar spine surgery at this time.  She is instructed to continue follow-up with her pain team as well as her orthopedic surgeon.  She is encouraged to reach out to us  if she has any problems or concerns in the future.   Thank you for involving me in the care of this patient.   Lyle Decamp, PA-C Dept. of Neurosurgery

## 2023-10-03 ENCOUNTER — Encounter: Payer: Self-pay | Admitting: Physician Assistant

## 2023-10-03 ENCOUNTER — Ambulatory Visit: Payer: PPO | Admitting: Physician Assistant

## 2023-10-03 VITALS — BP 116/82 | Ht 64.0 in | Wt 251.0 lb

## 2023-10-03 DIAGNOSIS — M25559 Pain in unspecified hip: Secondary | ICD-10-CM

## 2023-10-03 DIAGNOSIS — M48061 Spinal stenosis, lumbar region without neurogenic claudication: Secondary | ICD-10-CM | POA: Diagnosis not present

## 2023-10-03 DIAGNOSIS — M545 Low back pain, unspecified: Secondary | ICD-10-CM

## 2023-10-03 DIAGNOSIS — G8929 Other chronic pain: Secondary | ICD-10-CM

## 2023-10-05 DIAGNOSIS — L4050 Arthropathic psoriasis, unspecified: Secondary | ICD-10-CM | POA: Diagnosis not present

## 2023-10-05 DIAGNOSIS — Z0000 Encounter for general adult medical examination without abnormal findings: Secondary | ICD-10-CM | POA: Diagnosis not present

## 2023-10-05 DIAGNOSIS — Z23 Encounter for immunization: Secondary | ICD-10-CM | POA: Diagnosis not present

## 2023-10-05 DIAGNOSIS — M1611 Unilateral primary osteoarthritis, right hip: Secondary | ICD-10-CM | POA: Diagnosis not present

## 2023-10-05 DIAGNOSIS — E778 Other disorders of glycoprotein metabolism: Secondary | ICD-10-CM | POA: Diagnosis not present

## 2023-10-05 DIAGNOSIS — L409 Psoriasis, unspecified: Secondary | ICD-10-CM | POA: Diagnosis not present

## 2023-10-05 DIAGNOSIS — F411 Generalized anxiety disorder: Secondary | ICD-10-CM | POA: Diagnosis not present

## 2023-10-05 DIAGNOSIS — E7800 Pure hypercholesterolemia, unspecified: Secondary | ICD-10-CM | POA: Diagnosis not present

## 2023-10-05 DIAGNOSIS — Z6841 Body Mass Index (BMI) 40.0 and over, adult: Secondary | ICD-10-CM | POA: Diagnosis not present

## 2023-10-05 DIAGNOSIS — G4700 Insomnia, unspecified: Secondary | ICD-10-CM | POA: Diagnosis not present

## 2023-10-19 DIAGNOSIS — M160 Bilateral primary osteoarthritis of hip: Secondary | ICD-10-CM | POA: Diagnosis not present

## 2023-10-29 ENCOUNTER — Ambulatory Visit
Admission: RE | Admit: 2023-10-29 | Discharge: 2023-10-29 | Disposition: A | Discharge status: Home / Self Care | Payer: PPO | Source: Ambulatory Visit | Attending: Internal Medicine | Admitting: Internal Medicine

## 2023-10-29 DIAGNOSIS — Z1231 Encounter for screening mammogram for malignant neoplasm of breast: Secondary | ICD-10-CM | POA: Insufficient documentation

## 2023-10-29 DIAGNOSIS — M25511 Pain in right shoulder: Secondary | ICD-10-CM | POA: Diagnosis not present

## 2023-10-29 DIAGNOSIS — M5416 Radiculopathy, lumbar region: Secondary | ICD-10-CM | POA: Diagnosis not present

## 2023-10-29 DIAGNOSIS — M5441 Lumbago with sciatica, right side: Secondary | ICD-10-CM | POA: Diagnosis not present

## 2023-10-29 DIAGNOSIS — M25551 Pain in right hip: Secondary | ICD-10-CM | POA: Diagnosis not present

## 2023-10-29 DIAGNOSIS — M5442 Lumbago with sciatica, left side: Secondary | ICD-10-CM | POA: Diagnosis not present

## 2023-10-29 DIAGNOSIS — G8929 Other chronic pain: Secondary | ICD-10-CM | POA: Diagnosis not present

## 2023-11-05 ENCOUNTER — Telehealth: Payer: Self-pay | Admitting: Physician Assistant

## 2023-11-05 NOTE — Telephone Encounter (Signed)
 Patient requested this to be mailed to her.

## 2023-11-05 NOTE — Telephone Encounter (Signed)
 Seen on 10/03/2023 Patient called this morning asking if Ruthann Cover had submitted an order to [employer] for her to have therapeutic massages for her back and hips. Patient states that with her new [employer] plan they will pay for these services twice a month. I told the patient that I did not see an order in her chart and if she had a fax number to submit the order to her insurance. She did not. I tried calling [employer] but I got the run around by 2 people that they needed a CPT code. Brooke told her that she would place the order for therapeutic massages.

## 2023-11-06 ENCOUNTER — Other Ambulatory Visit: Payer: Self-pay | Admitting: Orthopedic Surgery

## 2023-11-13 ENCOUNTER — Other Ambulatory Visit: Payer: Self-pay

## 2023-11-13 ENCOUNTER — Encounter
Admission: RE | Admit: 2023-11-13 | Discharge: 2023-11-13 | Disposition: A | Discharge status: Home / Self Care | Payer: PPO | Source: Ambulatory Visit | Attending: Orthopedic Surgery | Admitting: Orthopedic Surgery

## 2023-11-13 ENCOUNTER — Telehealth: Payer: Self-pay | Admitting: Urgent Care

## 2023-11-13 ENCOUNTER — Encounter: Payer: Self-pay | Admitting: Urgent Care

## 2023-11-13 VITALS — BP 113/86 | HR 85 | Temp 97.5°F | Resp 18 | Ht 64.0 in | Wt 242.0 lb

## 2023-11-13 DIAGNOSIS — Z01810 Encounter for preprocedural cardiovascular examination: Secondary | ICD-10-CM | POA: Diagnosis not present

## 2023-11-13 DIAGNOSIS — Z01818 Encounter for other preprocedural examination: Secondary | ICD-10-CM | POA: Diagnosis not present

## 2023-11-13 DIAGNOSIS — Z01812 Encounter for preprocedural laboratory examination: Secondary | ICD-10-CM

## 2023-11-13 DIAGNOSIS — Z22322 Carrier or suspected carrier of Methicillin resistant Staphylococcus aureus: Secondary | ICD-10-CM

## 2023-11-13 HISTORY — DX: Other long term (current) drug therapy: Z79.899

## 2023-11-13 HISTORY — DX: Spinal stenosis, lumbar region with neurogenic claudication: M48.062

## 2023-11-13 HISTORY — DX: Carrier or suspected carrier of methicillin resistant Staphylococcus aureus: Z22.322

## 2023-11-13 HISTORY — DX: Personal history of (healed) traumatic fracture: Z87.81

## 2023-11-13 LAB — COMPREHENSIVE METABOLIC PANEL
ALT: 21 U/L (ref 0–44)
AST: 20 U/L (ref 15–41)
Albumin: 3.9 g/dL (ref 3.5–5.0)
Alkaline Phosphatase: 48 U/L (ref 38–126)
Anion gap: 9 (ref 5–15)
BUN: 20 mg/dL (ref 8–23)
CO2: 23 mmol/L (ref 22–32)
Calcium: 8.9 mg/dL (ref 8.9–10.3)
Chloride: 107 mmol/L (ref 98–111)
Creatinine, Ser: 0.74 mg/dL (ref 0.44–1.00)
GFR, Estimated: >60 mL/min (ref >60)
Glucose, Bld: 118 mg/dL — ABNORMAL HIGH (ref 70–99)
Potassium: 3.8 mmol/L (ref 3.5–5.1)
Sodium: 139 mmol/L (ref 135–145)
Total Bilirubin: 0.9 mg/dL (ref 0.0–1.2)
Total Protein: 6.2 g/dL — ABNORMAL LOW (ref 6.5–8.1)

## 2023-11-13 LAB — TYPE AND SCREEN
ABO/RH(D): A POS
Antibody Screen: NEGATIVE

## 2023-11-13 LAB — SURGICAL PCR SCREEN
MRSA, PCR: POSITIVE — AB
Staphylococcus aureus: POSITIVE — AB

## 2023-11-13 LAB — CBC WITH DIFFERENTIAL/PLATELET
Abs Immature Granulocytes: 0.02 10*3/uL (ref 0.00–0.07)
Basophils Absolute: 0 10*3/uL (ref 0.0–0.1)
Basophils Relative: 1 %
Eosinophils Absolute: 0 10*3/uL (ref 0.0–0.5)
Eosinophils Relative: 1 %
HCT: 43 % (ref 36.0–46.0)
Hemoglobin: 14.2 g/dL (ref 12.0–15.0)
Immature Granulocytes: 0 %
Lymphocytes Relative: 21 %
Lymphs Abs: 1.4 10*3/uL (ref 0.7–4.0)
MCH: 31.9 pg (ref 26.0–34.0)
MCHC: 33 g/dL (ref 30.0–36.0)
MCV: 96.6 fL (ref 80.0–100.0)
Monocytes Absolute: 0.5 10*3/uL (ref 0.1–1.0)
Monocytes Relative: 8 %
Neutro Abs: 4.5 10*3/uL (ref 1.7–7.7)
Neutrophils Relative %: 69 %
Platelets: 227 10*3/uL (ref 150–400)
RBC: 4.45 MIL/uL (ref 3.87–5.11)
RDW: 13.3 % (ref 11.5–15.5)
WBC: 6.4 10*3/uL (ref 4.0–10.5)
nRBC: 0 % (ref 0.0–0.2)

## 2023-11-13 LAB — URINALYSIS, ROUTINE W REFLEX MICROSCOPIC
Bilirubin Urine: NEGATIVE
Glucose, UA: NEGATIVE mg/dL
Hgb urine dipstick: NEGATIVE
Ketones, ur: NEGATIVE mg/dL
Leukocytes,Ua: NEGATIVE
Nitrite: NEGATIVE
Protein, ur: NEGATIVE mg/dL
Specific Gravity, Urine: 1.024 (ref 1.005–1.030)
pH: 5 (ref 5.0–8.0)

## 2023-11-13 MED ORDER — MUPIROCIN 2 % EX OINT: TOPICAL_OINTMENT | CUTANEOUS | 0 refills | Status: AC

## 2023-11-13 NOTE — Patient Instructions (Addendum)
Your procedure is scheduled on: Thursday 11/22/23  Report to the Registration Desk on the 1st floor of the [facility]. To find out your arrival time, please call [phone] between 1PM - 3PM on: Wednesday 11/21/23  If your arrival time is 6:00 am, do not arrive before that time as the [facility] entrance doors do not open until 6:00 am.  REMEMBER: Instructions that are not followed completely may result in serious medical risk, up to and including death; or upon the discretion of your surgeon and anesthesiologist your surgery may need to be rescheduled.  Do not eat food after midnight the night before surgery.  No gum chewing or hard candies.  You may however, drink CLEAR liquids up to 2 hours before you are scheduled to arrive for your surgery. Do not drink anything within 2 hours of your scheduled arrival time.  Clear liquids include: - water  - apple juice without pulp - gatorade (not RED colors) - black coffee or tea (Do NOT add milk or creamers to the coffee or tea) Do NOT drink anything that is not on this list.   In addition, your doctor has ordered for you to drink the provided:  Ensure Pre-Surgery Clear Carbohydrate Drink  Drinking this carbohydrate drink up to two hours before surgery helps to reduce insulin resistance and improve patient outcomes. Please complete drinking 2 hours before scheduled arrival time.  One week prior to surgery: Stop Anti-inflammatories (NSAIDS) such as Advil, Aleve, Ibuprofen, Motrin, Naproxen, Naprosyn and Aspirin based products such as Excedrin, Goody's Powder, BC Powder. Stop ANY OVER THE COUNTER supplements until after surgery. Biotin 09811 MCG  folic acid (FOLVITE) 1 MG  meloxicam (MOBIC) 15 MG  Vitamin D  Stop Phentermine-Topiramate (QSYMIA) 7.5-46 MG 7 days prior to surgery (take last dose 11/14/23) Do not take methotrexate (RHEUMATREX) 2.5 MG (the day of surgery)  You may however, continue to take Tylenol if needed for pain up  until the day of surgery.   Continue taking all of your other prescription medications up until the day of surgery.  ON THE DAY OF SURGERY ONLY TAKE THESE MEDICATIONS WITH SIPS OF WATER:  ALPRAZolam (XANAX) 0.5 MG (if needed)    No Alcohol for 24 hours before or after surgery.  No Smoking including e-cigarettes for 24 hours before surgery.  No chewable tobacco products for at least 6 hours before surgery.  No nicotine patches on the day of surgery.  Do not use any "recreational" drugs for at least a week (preferably 2 weeks) before your surgery.  Please be advised that the combination of cocaine and anesthesia may have negative outcomes, up to and including death. If you test positive for cocaine, your surgery will be cancelled.  On the morning of surgery brush your teeth with toothpaste and water, you may rinse your mouth with mouthwash if you wish. Do not swallow any toothpaste or mouthwash.  Use CHG Soap or wipes as directed on instruction sheet.  Do not wear jewelry, make-up, hairpins, clips or nail polish.  For welded (permanent) jewelry: bracelets, anklets, waist bands, etc.  Please have this removed prior to surgery.  If it is not removed, there is a chance that hospital personnel will need to cut it off on the day of surgery.  Do not wear lotions, powders, or perfumes.   Do not shave body hair from the neck down 48 hours before surgery.  Contact lenses, hearing aids and dentures may not be worn into surgery.  Do not bring valuables to the hospital. [facility] is not responsible for any missing/lost belongings or valuables.   Total Shoulder Arthroplasty:  use Benzoyl Peroxide 5% Gel as directed on instruction sheet.  Bring your C-PAP to the hospital in case you may have to spend the night.   Notify your doctor if there is any change in your medical condition (cold, fever, infection).  Wear comfortable clothing (specific to your surgery type) to the  hospital.  After surgery, you can help prevent lung complications by doing breathing exercises.  Take deep breaths and cough every 1-2 hours. Your doctor may order a device called an Incentive Spirometer to help you take deep breaths. When coughing or sneezing, hold a pillow firmly against your incision with both hands. This is called "splinting." Doing this helps protect your incision. It also decreases belly discomfort.  If you are being admitted to the hospital overnight, leave your suitcase in the car. After surgery it may be brought to your room.  In case of increased patient census, it may be necessary for you, the patient, to continue your postoperative care in the Same Day Surgery department.  If you are being discharged the day of surgery, you will not be allowed to drive home. You will need a responsible individual to drive you home and stay with you for 24 hours after surgery.   If you are taking public transportation, you will need to have a responsible individual with you.  Please call the Pre-admissions Testing Dept. at [phone] if you have any questions about these instructions.  Surgery Visitation Policy:  Patients having surgery or a procedure may have two visitors.  Children under the age of 26 must have an adult with them who is not the patient.  Temporary Visitor Restrictions Due to increasing cases of flu, RSV and COVID-19: Children ages 70 and under will not be able to visit patients in [facility] hospitals under most circumstances.  Inpatient Visitation:    Visiting hours are 7 a.m. to 8 p.m. Up to four visitors are allowed at one time in a patient room. The visitors may rotate out with other people during the day.  One visitor age 6 or older may stay with the patient overnight and must be in the room by 8 p.m.    Pre-operative 5 CHG Bath Instructions   You can play a key role in reducing the risk of infection after surgery. Your skin needs to be as  free of germs as possible. You can reduce the number of germs on your skin by washing with CHG (chlorhexidine gluconate) soap before surgery. CHG is an antiseptic soap that kills germs and continues to kill germs even after washing.   DO NOT use if you have an allergy to chlorhexidine/CHG or antibacterial soaps. If your skin becomes reddened or irritated, stop using the CHG and notify one of our RNs at [phone].   Please shower with the CHG soap starting 4 days before surgery using the following schedule:     Please keep in mind the following:  DO NOT shave, including legs and underarms, starting the day of your first shower.   You may shave your face at any point before/day of surgery.  Place clean sheets on your bed the day you start using CHG soap. Use a clean washcloth (not used since being washed) for each shower. DO NOT sleep with pets once you start using the CHG.   CHG Shower Instructions:  If  you choose to wash your hair and private area, wash first with your normal shampoo/soap.  After you use shampoo/soap, rinse your hair and body thoroughly to remove shampoo/soap residue.  Turn the water OFF and apply about 3 tablespoons (45 ml) of CHG soap to a CLEAN washcloth.  Apply CHG soap ONLY FROM YOUR NECK DOWN TO YOUR TOES (washing for 3-5 minutes)  DO NOT use CHG soap on face, private areas, open wounds, or sores.  Pay special attention to the area where your surgery is being performed.  If you are having back surgery, having someone wash your back for you may be helpful. Wait 2 minutes after CHG soap is applied, then you may rinse off the CHG soap.  Pat dry with a clean towel  Put on clean clothes/pajamas   If you choose to wear lotion, please use ONLY the CHG-compatible lotions on the back of this paper.     Additional instructions for the day of surgery: DO NOT APPLY any lotions, deodorants, cologne, or perfumes.   Put on clean/comfortable clothes.  Brush your teeth.  Ask  your nurse before applying any prescription medications to the skin.      CHG Compatible Lotions   Aveeno Moisturizing lotion  Cetaphil Moisturizing Cream  Cetaphil Moisturizing Lotion  Clairol Herbal Essence Moisturizing Lotion, Dry Skin  Clairol Herbal Essence Moisturizing Lotion, Extra Dry Skin  Clairol Herbal Essence Moisturizing Lotion, Normal Skin  Curel Age Defying Therapeutic Moisturizing Lotion with Alpha Hydroxy  Curel Extreme Care Body Lotion  Curel Soothing Hands Moisturizing Hand Lotion  Curel Therapeutic Moisturizing Cream, Fragrance-Free  Curel Therapeutic Moisturizing Lotion, Fragrance-Free  Curel Therapeutic Moisturizing Lotion, Original Formula  Eucerin Daily Replenishing Lotion  Eucerin Dry Skin Therapy Plus Alpha Hydroxy Crme  Eucerin Dry Skin Therapy Plus Alpha Hydroxy Lotion  Eucerin Original Crme  Eucerin Original Lotion  Eucerin Plus Crme Eucerin Plus Lotion  Eucerin TriLipid Replenishing Lotion  Keri Anti-Bacterial Hand Lotion  Keri Deep Conditioning Original Lotion Dry Skin Formula Softly Scented  Keri Deep Conditioning Original Lotion, Fragrance Free Sensitive Skin Formula  Keri Lotion Fast Absorbing Fragrance Free Sensitive Skin Formula  Keri Lotion Fast Absorbing Softly Scented Dry Skin Formula  Keri Original Lotion  Keri Skin Renewal Lotion Keri Silky Smooth Lotion  Keri Silky Smooth Sensitive Skin Lotion  Nivea Body Creamy Conditioning Oil  Nivea Body Extra Enriched Lotion  Nivea Body Original Lotion  Nivea Body Sheer Moisturizing Lotion Nivea Crme  Nivea Skin Firming Lotion  NutraDerm 30 Skin Lotion  NutraDerm Skin Lotion  NutraDerm Therapeutic Skin Cream  NutraDerm Therapeutic Skin Lotion  ProShield Protective Hand Cream  Provon moisturizing lotion  How to Use an Incentive Spirometer  An incentive spirometer is a tool that measures how well you are filling your lungs with each breath. Learning to take long, deep breaths using this  tool can help you keep your lungs clear and active. This may help to reverse or lessen your chance of developing breathing (pulmonary) problems, especially infection. You may be asked to use a spirometer: After a surgery. If you have a lung problem or a history of smoking. After a long period of time when you have been unable to move or be active. If the spirometer includes an indicator to show the highest number that you have reached, your health care provider or respiratory therapist will help you set a goal. Keep a log of your progress as told by your health care provider. What are the risks?  Breathing too quickly may cause dizziness or cause you to pass out. Take your time so you do not get dizzy or light-headed. If you are in pain, you may need to take pain medicine before doing incentive spirometry. It is harder to take a deep breath if you are having pain. How to use your incentive spirometer  Sit up on the edge of your bed or on a chair. Hold the incentive spirometer so that it is in an upright position. Before you use the spirometer, breathe out normally. Place the mouthpiece in your mouth. Make sure your lips are closed tightly around it. Breathe in slowly and as deeply as you can through your mouth, causing the piston or the ball to rise toward the top of the chamber. Hold your breath for 3-5 seconds, or for as long as possible. If the spirometer includes a coach indicator, use this to guide you in breathing. Slow down your breathing if the indicator goes above the marked areas. Remove the mouthpiece from your mouth and breathe out normally. The piston or ball will return to the bottom of the chamber. Rest for a few seconds, then repeat the steps 10 or more times. Take your time and take a few normal breaths between deep breaths so that you do not get dizzy or light-headed. Do this every 1-2 hours when you are awake. If the spirometer includes a goal marker to show the highest number  you have reached (best effort), use this as a goal to work toward during each repetition. After each set of 10 deep breaths, cough a few times. This will help to make sure that your lungs are clear. If you have an incision on your chest or abdomen from surgery, place a pillow or a rolled-up towel firmly against the incision when you cough. This can help to reduce pain while taking deep breaths and coughing. General tips When you are able to get out of bed: Walk around often. Continue to take deep breaths and cough in order to clear your lungs. Keep using the incentive spirometer until your health care provider says it is okay to stop using it. If you have been in the hospital, you may be told to keep using the spirometer at home. Contact a health care provider if: You are having difficulty using the spirometer. You have trouble using the spirometer as often as instructed. Your pain medicine is not giving enough relief for you to use the spirometer as told. You have a fever. Get help right away if: You develop shortness of breath. You develop a cough with bloody mucus from the lungs. You have fluid or blood coming from an incision site after you cough. Summary An incentive spirometer is a tool that can help you learn to take long, deep breaths to keep your lungs clear and active. You may be asked to use a spirometer after a surgery, if you have a lung problem or a history of smoking, or if you have been inactive for a long period of time. Use your incentive spirometer as instructed every 1-2 hours while you are awake. If you have an incision on your chest or abdomen, place a pillow or a rolled-up towel firmly against your incision when you cough. This will help to reduce pain. Get help right away if you have shortness of breath, you cough up bloody mucus, or blood comes from your incision when you cough. This information is not intended to replace advice given to you by  your health care  provider. Make sure you discuss any questions you have with your health care provider. Document Revised: 12/01/2019 Document Reviewed: 12/01/2019   Please go to the following website to access important education materials concerning your upcoming joint replacement.                                   [email or web address]

## 2023-11-13 NOTE — Progress Notes (Signed)
  Perioperative Services  Abnormal Lab Notification and Treatment Plan of Care  Date: 11/13/23  Name: Michelle Jordan MRN:   [id number]  Re: Abnormal labs noted during PAT appointment  Provider Notified: Reinaldo Berber, MD Notification mode: Routed and/or faxed via [facility]  Labs of concern: Lab Results  Component Value Date   STAPHAUREUS POSITIVE (A) 11/13/2023   MRSAPCR POSITIVE (A) 11/13/2023    Notes: Patient is scheduled for a RIGHT TOTAL HIP ARTHROPLASTY ANTERIOR APPROACH on 11/22/2023. She is scheduled to receive CEFAZOLIN pre-operatively. Pre-surgical PCR (+) for MRSA; see above.  PLANS:  Review renal function. Estimated Creatinine Clearance: 84.9 mL/min (by C-G formula based on SCr of 0.74 mg/dL).  Review allergies. No documented allergy to vancomycin.  Order added for VANCOMYCIN 1 GRAM IV to current preoperative prophylactic regimen.   Patient with orders for both CEFAZOLIN + VANCOMYCIN to be given in the setting of documented MRSA (+) surgical PCR.   Guidelines suggest that a beta-lactam antibiotic (first or second generation cephalosporin) should be added for activity against gram-negative organisms.  Vancomycin appears to be less effective than cefazolin for preventing SSIs caused by MSSA. For this reason, the use of vancomycin in combination with cefazolin is favored for prevention of SSI due to MRSA and coagulase-negative staphylococci.  Medical history in [facility] updated to reflect (+) PCR result indicating nasal MRSA colonization   Rx for additional preoperative nasal decolonization sent to patient's pharmacy of record per MD request.   Meds ordered this encounter  Medications   mupirocin ointment (BACTROBAN) 2 %    Sig: Apply small amount to the inside of both nostrils TWICE a day for the next 5 days.    Dispense:  15 g    Refill:  0    Please contact patient with available for pick up. Rx is for preoperative nasal decolonization following PCR testing for MRSA.  Needs to be started ASAP.   Quentin Mulling, MSN, APRN, FNP-C, CEN [facility]  Perioperative Services Nurse Practitioner Phone: [phone] 11/13/23 2:28 PM

## 2023-11-21 DIAGNOSIS — M160 Bilateral primary osteoarthritis of hip: Secondary | ICD-10-CM | POA: Diagnosis not present

## 2023-11-21 MED ORDER — VANCOMYCIN HCL IN DEXTROSE 1-5 GM/200ML-% IV SOLN
1000.0000 mg | Freq: Once | INTRAVENOUS | Status: AC
  Administered 2023-11-22: 1000 mg via INTRAVENOUS

## 2023-11-21 MED ORDER — LACTATED RINGERS IV SOLN: INTRAVENOUS | Status: DC

## 2023-11-21 MED ORDER — ORAL CARE MOUTH RINSE: 15.0000 mL | Freq: Once | OROMUCOSAL | Status: AC

## 2023-11-21 MED ORDER — CHLORHEXIDINE GLUCONATE 0.12 % MT SOLN
15.0000 mL | Freq: Once | OROMUCOSAL | Status: AC
  Administered 2023-11-22: 15 mL via OROMUCOSAL

## 2023-11-21 MED ORDER — DEXAMETHASONE SODIUM PHOSPHATE 10 MG/ML IJ SOLN
8.0000 mg | Freq: Once | INTRAMUSCULAR | Status: AC
  Administered 2023-11-22: 10 mg via INTRAVENOUS

## 2023-11-21 MED ORDER — CEFAZOLIN SODIUM-DEXTROSE 2-4 GM/100ML-% IV SOLN
2.0000 g | INTRAVENOUS | Status: AC
  Administered 2023-11-22: 2 g via INTRAVENOUS

## 2023-11-22 ENCOUNTER — Ambulatory Visit: Payer: PPO | Admitting: Urgent Care

## 2023-11-22 ENCOUNTER — Other Ambulatory Visit: Payer: Self-pay

## 2023-11-22 ENCOUNTER — Ambulatory Visit
Admission: RE | Admit: 2023-11-22 | Discharge: 2023-11-23 | Disposition: A | Discharge status: Home Health | Payer: PPO | Attending: Orthopedic Surgery | Admitting: Orthopedic Surgery

## 2023-11-22 ENCOUNTER — Ambulatory Visit: Payer: PPO

## 2023-11-22 ENCOUNTER — Encounter: Payer: Self-pay | Admitting: Orthopedic Surgery

## 2023-11-22 ENCOUNTER — Encounter
Admission: RE | Disposition: A | Discharge status: Home Health | Payer: Self-pay | Source: Home / Self Care | Attending: Orthopedic Surgery

## 2023-11-22 DIAGNOSIS — M1611 Unilateral primary osteoarthritis, right hip: Secondary | ICD-10-CM | POA: Insufficient documentation

## 2023-11-22 DIAGNOSIS — I10 Essential (primary) hypertension: Secondary | ICD-10-CM | POA: Insufficient documentation

## 2023-11-22 DIAGNOSIS — Z96641 Presence of right artificial hip joint: Secondary | ICD-10-CM | POA: Diagnosis present

## 2023-11-22 HISTORY — PX: TOTAL HIP ARTHROPLASTY: SHX124

## 2023-11-22 SURGERY — ARTHROPLASTY, HIP, TOTAL, ANTERIOR APPROACH
Anesthesia: Spinal | Site: Hip | Laterality: Right

## 2023-11-22 MED ORDER — VANCOMYCIN HCL IN DEXTROSE 1-5 GM/200ML-% IV SOLN
INTRAVENOUS | Status: AC
  Filled 2023-11-22: qty 200

## 2023-11-22 MED ORDER — MIDAZOLAM HCL 2 MG/2ML IJ SOLN
INTRAMUSCULAR | Status: AC
  Filled 2023-11-22: qty 2

## 2023-11-22 MED ORDER — MIDAZOLAM HCL 5 MG/5ML IJ SOLN
INTRAMUSCULAR | Status: DC | PRN
  Administered 2023-11-22: 2 mg via INTRAVENOUS

## 2023-11-22 MED ORDER — PHENTERMINE-TOPIRAMATE ER 7.5-46 MG PO CP24: 1.0000 | ORAL_CAPSULE | Freq: Every day | ORAL | Status: DC

## 2023-11-22 MED ORDER — SODIUM CHLORIDE 0.9 % IV SOLN: INTRAVENOUS | Status: DC

## 2023-11-22 MED ORDER — FENTANYL CITRATE (PF) 100 MCG/2ML IJ SOLN
INTRAMUSCULAR | Status: DC | PRN
  Administered 2023-11-22 (×2): 25 ug via INTRAVENOUS
  Administered 2023-11-22: 50 ug via INTRAVENOUS

## 2023-11-22 MED ORDER — CEFAZOLIN SODIUM-DEXTROSE 2-4 GM/100ML-% IV SOLN
2.0000 g | Freq: Four times a day (QID) | INTRAVENOUS | Status: AC
  Administered 2023-11-22 – 2023-11-23 (×2): 2 g via INTRAVENOUS
  Filled 2023-11-22 (×2): qty 100

## 2023-11-22 MED ORDER — ENOXAPARIN SODIUM 40 MG/0.4ML IJ SOSY
40.0000 mg | PREFILLED_SYRINGE | INTRAMUSCULAR | Status: DC
  Administered 2023-11-23: 40 mg via SUBCUTANEOUS
  Filled 2023-11-22: qty 0.4

## 2023-11-22 MED ORDER — MORPHINE SULFATE (PF) 2 MG/ML IV SOLN: 0.5000 mg | INTRAVENOUS | Status: DC | PRN

## 2023-11-22 MED ORDER — DOCUSATE SODIUM 100 MG PO CAPS
100.0000 mg | ORAL_CAPSULE | Freq: Every day | ORAL | Status: DC
  Administered 2023-11-22 – 2023-11-23 (×2): 100 mg via ORAL
  Filled 2023-11-22 (×2): qty 1

## 2023-11-22 MED ORDER — PHENYLEPHRINE HCL-NACL 20-0.9 MG/250ML-% IV SOLN
INTRAVENOUS | Status: DC | PRN
  Administered 2023-11-22: 40 ug/min via INTRAVENOUS

## 2023-11-22 MED ORDER — CHLORHEXIDINE GLUCONATE 4 % EX SOLN: 1.0000 | CUTANEOUS | 1 refills | Status: AC

## 2023-11-22 MED ORDER — SODIUM CHLORIDE 0.9 % IR SOLN
Status: DC | PRN
  Administered 2023-11-22: 100

## 2023-11-22 MED ORDER — SODIUM CHLORIDE (PF) 0.9 % IJ SOLN
INTRAMUSCULAR | Status: DC | PRN
  Administered 2023-11-22: 50 mL via INTRAMUSCULAR

## 2023-11-22 MED ORDER — ONDANSETRON HCL 4 MG/2ML IJ SOLN
INTRAMUSCULAR | Status: DC | PRN
  Administered 2023-11-22: 4 mg via INTRAVENOUS

## 2023-11-22 MED ORDER — ONDANSETRON HCL 4 MG PO TABS: 4.0000 mg | ORAL_TABLET | Freq: Four times a day (QID) | ORAL | Status: DC | PRN

## 2023-11-22 MED ORDER — TRAZODONE HCL 100 MG PO TABS
200.0000 mg | ORAL_TABLET | Freq: Every day | ORAL | Status: DC
  Administered 2023-11-22: 200 mg via ORAL
  Filled 2023-11-22: qty 2

## 2023-11-22 MED ORDER — KETAMINE HCL 50 MG/5ML IJ SOSY
PREFILLED_SYRINGE | INTRAMUSCULAR | Status: DC | PRN
  Administered 2023-11-22: 10 mg via INTRAVENOUS
  Administered 2023-11-22 (×2): 20 mg via INTRAVENOUS

## 2023-11-22 MED ORDER — PROPOFOL 10 MG/ML IV BOLUS
INTRAVENOUS | Status: DC | PRN
  Administered 2023-11-22: 60 ug/kg/min via INTRAVENOUS

## 2023-11-22 MED ORDER — METOCLOPRAMIDE HCL 5 MG/ML IJ SOLN: 5.0000 mg | Freq: Three times a day (TID) | INTRAMUSCULAR | Status: DC | PRN

## 2023-11-22 MED ORDER — TRAMADOL HCL 50 MG PO TABS
50.0000 mg | ORAL_TABLET | Freq: Four times a day (QID) | ORAL | Status: DC | PRN
  Administered 2023-11-22 – 2023-11-23 (×3): 50 mg via ORAL
  Filled 2023-11-22 (×2): qty 1

## 2023-11-22 MED ORDER — METOCLOPRAMIDE HCL 10 MG PO TABS: 5.0000 mg | ORAL_TABLET | Freq: Three times a day (TID) | ORAL | Status: DC | PRN

## 2023-11-22 MED ORDER — TRANEXAMIC ACID-NACL 1000-0.7 MG/100ML-% IV SOLN
1000.0000 mg | INTRAVENOUS | Status: AC
  Administered 2023-11-22 (×2): 1000 mg via INTRAVENOUS

## 2023-11-22 MED ORDER — CHLORHEXIDINE GLUCONATE 0.12 % MT SOLN
OROMUCOSAL | Status: AC
  Filled 2023-11-22: qty 15

## 2023-11-22 MED ORDER — SENNA 8.6 MG PO TABS
1.0000 | ORAL_TABLET | Freq: Two times a day (BID) | ORAL | Status: DC
  Administered 2023-11-22 – 2023-11-23 (×2): 8.6 mg via ORAL
  Filled 2023-11-22 (×2): qty 1

## 2023-11-22 MED ORDER — BUPIVACAINE HCL (PF) 0.5 % IJ SOLN
INTRAMUSCULAR | Status: DC | PRN
Start: 2023-11-22 — End: 2023-11-22
  Administered 2023-11-22: 3 mL

## 2023-11-22 MED ORDER — CEFAZOLIN SODIUM-DEXTROSE 2-4 GM/100ML-% IV SOLN
INTRAVENOUS | Status: AC
  Filled 2023-11-22: qty 100

## 2023-11-22 MED ORDER — TIZANIDINE HCL 4 MG PO TABS
2.0000 mg | ORAL_TABLET | Freq: Three times a day (TID) | ORAL | Status: DC | PRN
  Administered 2023-11-22: 2 mg via ORAL
  Filled 2023-11-22: qty 1

## 2023-11-22 MED ORDER — HYDROCODONE-ACETAMINOPHEN 5-325 MG PO TABS
1.0000 | ORAL_TABLET | ORAL | Status: DC | PRN
  Administered 2023-11-22 – 2023-11-23 (×2): 1 via ORAL
  Filled 2023-11-22 (×3): qty 1

## 2023-11-22 MED ORDER — OXYCODONE HCL 5 MG PO TABS
ORAL_TABLET | ORAL | Status: AC
  Filled 2023-11-22: qty 1

## 2023-11-22 MED ORDER — TRANEXAMIC ACID-NACL 1000-0.7 MG/100ML-% IV SOLN
INTRAVENOUS | Status: AC
  Filled 2023-11-22: qty 100

## 2023-11-22 MED ORDER — ACETAMINOPHEN 10 MG/ML IV SOLN
INTRAVENOUS | Status: AC
  Filled 2023-11-22: qty 100

## 2023-11-22 MED ORDER — PHENOL 1.4 % MT LIQD: 1.0000 | OROMUCOSAL | Status: DC | PRN

## 2023-11-22 MED ORDER — OXYCODONE HCL 5 MG/5ML PO SOLN: 5.0000 mg | Freq: Once | ORAL | Status: AC | PRN

## 2023-11-22 MED ORDER — OXYCODONE HCL 5 MG PO TABS
5.0000 mg | ORAL_TABLET | Freq: Once | ORAL | Status: AC | PRN
  Administered 2023-11-22: 5 mg via ORAL

## 2023-11-22 MED ORDER — ACETAMINOPHEN 10 MG/ML IV SOLN
INTRAVENOUS | Status: DC | PRN
  Administered 2023-11-22: 1000 mg via INTRAVENOUS

## 2023-11-22 MED ORDER — ACETAMINOPHEN 325 MG PO TABS: 325.0000 mg | ORAL_TABLET | Freq: Four times a day (QID) | ORAL | Status: DC | PRN

## 2023-11-22 MED ORDER — ORAL CARE MOUTH RINSE: 15.0000 mL | OROMUCOSAL | Status: DC | PRN

## 2023-11-22 MED ORDER — ACETAMINOPHEN 500 MG PO TABS
1000.0000 mg | ORAL_TABLET | Freq: Three times a day (TID) | ORAL | Status: DC
  Administered 2023-11-22 – 2023-11-23 (×2): 1000 mg via ORAL
  Filled 2023-11-22 (×2): qty 2

## 2023-11-22 MED ORDER — 0.9 % SODIUM CHLORIDE (POUR BTL) OPTIME
TOPICAL | Status: DC | PRN
  Administered 2023-11-22: 500 mL

## 2023-11-22 MED ORDER — FENTANYL CITRATE (PF) 100 MCG/2ML IJ SOLN
25.0000 ug | INTRAMUSCULAR | Status: DC | PRN
Start: 2023-11-22 — End: 2023-11-22

## 2023-11-22 MED ORDER — TRAMADOL HCL 50 MG PO TABS
ORAL_TABLET | ORAL | Status: AC
  Filled 2023-11-22: qty 1

## 2023-11-22 MED ORDER — MUPIROCIN 2 % EX OINT: 1.0000 | TOPICAL_OINTMENT | Freq: Two times a day (BID) | CUTANEOUS | 0 refills | Status: AC

## 2023-11-22 MED ORDER — MENTHOL 3 MG MT LOZG: 1.0000 | LOZENGE | OROMUCOSAL | Status: DC | PRN

## 2023-11-22 MED ORDER — PANTOPRAZOLE SODIUM 40 MG PO TBEC
40.0000 mg | DELAYED_RELEASE_TABLET | Freq: Every day | ORAL | Status: DC
  Administered 2023-11-22 – 2023-11-23 (×2): 40 mg via ORAL
  Filled 2023-11-22 (×2): qty 1

## 2023-11-22 MED ORDER — FENTANYL CITRATE (PF) 100 MCG/2ML IJ SOLN
INTRAMUSCULAR | Status: AC
Start: 2023-11-22 — End: ?
  Filled 2023-11-22: qty 2

## 2023-11-22 MED ORDER — SURGIPHOR WOUND IRRIGATION SYSTEM - OPTIME: TOPICAL | Status: DC | PRN

## 2023-11-22 MED ORDER — KETOROLAC TROMETHAMINE 15 MG/ML IJ SOLN
7.5000 mg | Freq: Four times a day (QID) | INTRAMUSCULAR | Status: AC
  Administered 2023-11-22 – 2023-11-23 (×4): 7.5 mg via INTRAVENOUS
  Filled 2023-11-22 (×4): qty 1

## 2023-11-22 MED ORDER — ALPRAZOLAM 0.25 MG PO TABS: 0.2500 mg | ORAL_TABLET | Freq: Every day | ORAL | Status: DC | PRN

## 2023-11-22 MED ORDER — ONDANSETRON HCL 4 MG/2ML IJ SOLN: 4.0000 mg | Freq: Four times a day (QID) | INTRAMUSCULAR | Status: DC | PRN

## 2023-11-22 MED ORDER — KETAMINE HCL 50 MG/5ML IJ SOSY
PREFILLED_SYRINGE | INTRAMUSCULAR | Status: AC
  Filled 2023-11-22: qty 5

## 2023-11-22 SURGICAL SUPPLY — 63 items
BLADE CLIPPER SURG (BLADE) IMPLANT
BLADE SAGITTAL AGGR TOOTH XLG (BLADE) ×1 IMPLANT
BNDG COHESIVE 6X5 TAN ST LF (GAUZE/BANDAGES/DRESSINGS) ×1 IMPLANT
BRUSH SCRUB EZ PLAIN DRY (MISCELLANEOUS) ×1 IMPLANT
CHLORAPREP W/TINT 26 (MISCELLANEOUS) ×1 IMPLANT
DERMABOND ADVANCED .7 DNX12 (GAUZE/BANDAGES/DRESSINGS) ×1 IMPLANT
DRAPE C-ARM XRAY 36X54 (DRAPES) ×1 IMPLANT
DRAPE SHEET LG 3/4 BI-LAMINATE (DRAPES) ×3 IMPLANT
DRAPE TABLE BACK 80X90 (DRAPES) ×1 IMPLANT
DRSG MEPILEX SACRM 8.7X9.8 (GAUZE/BANDAGES/DRESSINGS) ×1 IMPLANT
DRSG OPSITE POSTOP 4X8 (GAUZE/BANDAGES/DRESSINGS) ×1 IMPLANT
ELECT BLADE 4.0 EZ CLEAN MEGAD (MISCELLANEOUS) ×1 IMPLANT
ELECT REM PT RETURN 9FT ADLT (ELECTROSURGICAL) ×1 IMPLANT
ELECTRODE BLDE 4.0 EZ CLN MEGD (MISCELLANEOUS) ×1 IMPLANT
ELECTRODE REM PT RTRN 9FT ADLT (ELECTROSURGICAL) ×1 IMPLANT
GLOVE BIO SURGEON STRL SZ8 (GLOVE) ×1 IMPLANT
GLOVE BIOGEL PI IND STRL 8 (GLOVE) ×1 IMPLANT
GLOVE PI ORTHO PRO STRL 7.5 (GLOVE) ×2 IMPLANT
GLOVE PI ORTHO PRO STRL SZ8 (GLOVE) ×2 IMPLANT
GLOVE SURG SYN 7.5 E (GLOVE) ×1 IMPLANT
GLOVE SURG SYN 7.5 PF PI (GLOVE) ×1 IMPLANT
GOWN SRG XL LVL 3 NONREINFORCE (GOWNS) ×1 IMPLANT
GOWN STRL REUS W/ TWL LRG LVL3 (GOWN DISPOSABLE) ×1 IMPLANT
GOWN STRL REUS W/ TWL XL LVL3 (GOWN DISPOSABLE) ×1 IMPLANT
HEAD BIOLOX HIP 36/-2.5 (Joint) IMPLANT
HIP BIOLOX HD 36/-2.5 (Joint) ×1 IMPLANT
HOOD PEEL AWAY T7 (MISCELLANEOUS) ×2 IMPLANT
INSERT 0 DEGREE 36 (Miscellaneous) IMPLANT
IV NS 100ML SINGLE PACK (IV SOLUTION) ×1 IMPLANT
KIT PATIENT CARE HANA TABLE (KITS) ×1 IMPLANT
KIT TURNOVER CYSTO (KITS) ×1 IMPLANT
LIGHT WAVEGUIDE WIDE FLAT (MISCELLANEOUS) ×1 IMPLANT
MANIFOLD NEPTUNE II (INSTRUMENTS) ×1 IMPLANT
MARKER SKIN DUAL TIP RULER LAB (MISCELLANEOUS) ×1 IMPLANT
MAT ABSORB FLUID 56X50 GRAY (MISCELLANEOUS) ×1 IMPLANT
NDL SPNL 20GX3.5 QUINCKE YW (NEEDLE) ×1 IMPLANT
NEEDLE SPNL 20GX3.5 QUINCKE YW (NEEDLE) ×1 IMPLANT
NS IRRIG 500ML POUR BTL (IV SOLUTION) ×1 IMPLANT
PACK HIP COMPR (MISCELLANEOUS) ×1 IMPLANT
PAD ARMBOARD 7.5X6 YLW CONV (MISCELLANEOUS) ×1 IMPLANT
PENCIL SMOKE EVACUATOR (MISCELLANEOUS) ×1 IMPLANT
SCREW HEX LP 6.5X20 (Screw) IMPLANT
SCREW HEX LP 6.5X30 (Screw) IMPLANT
SHELL ACETAB TRIDENT 48 (Shell) IMPLANT
SLEEVE SCD COMPRESS KNEE MED (STOCKING) ×1 IMPLANT
SOLUTION IRRIG SURGIPHOR (IV SOLUTION) ×1 IMPLANT
STEM STD OFFSET SZ5 36 (Stem) IMPLANT
SURGIFLO W/THROMBIN 8M KIT (HEMOSTASIS) IMPLANT
SUT BONE WAX W31G (SUTURE) ×1 IMPLANT
SUT ETHIBOND 2 V 37 (SUTURE) ×1 IMPLANT
SUT SILK 0 30XBRD TIE 6 (SUTURE) ×1 IMPLANT
SUT STRATA 1 CT-1 DLB (SUTURE) ×1 IMPLANT
SUT STRATAFIX 14 PDO 48 VLT (SUTURE) ×1 IMPLANT
SUT STRATAFIX PDO 1 14 VIOLET (SUTURE) ×1 IMPLANT
SUT VIC AB 0 CT1 36 (SUTURE) ×1 IMPLANT
SUT VIC AB 2-0 CT2 27 (SUTURE) ×1 IMPLANT
SUTURE STRATA SPIR 4-0 18 (SUTURE) ×1 IMPLANT
SYR 30ML LL (SYRINGE) ×2 IMPLANT
TAPE MICROFOAM 4IN (TAPE) IMPLANT
TOWEL OR 17X26 4PK STRL BLUE (TOWEL DISPOSABLE) IMPLANT
TRAP FLUID SMOKE EVACUATOR (MISCELLANEOUS) ×1 IMPLANT
WAND WEREWOLF FASTSEAL 6.0 (MISCELLANEOUS) ×1 IMPLANT
WATER STERILE IRR 1000ML POUR (IV SOLUTION) ×1 IMPLANT

## 2023-11-22 NOTE — Progress Notes (Signed)
 PT Cancellation Note  Patient Details Name: Michelle Jordan MRN: [id number] DOB: 1956/06/06   Cancelled Treatment:    Reason Eval/Treat Not Completed: Medical issues which prohibited therapy Discussed case with PACU nurse, she is out of surgery but not close to being able to participate with PT at this time.  Will plan to initiate PT eval tomorrow AM.    Malachi Pro, DPT 11/22/2023, 4:01 PM

## 2023-11-22 NOTE — Anesthesia Procedure Notes (Signed)
 Spinal  Patient location during procedure: OR Start time: 11/22/2023 1:32 PM End time: 11/22/2023 1:32 PM Reason for block: surgical anesthesia Staffing Performed: resident/CRNA  Anesthesiologist: Piscitello, Cleda Mccreedy, MD Resident/CRNA: Maryla Morrow., CRNA Performed by: Maryla Morrow., CRNA Authorized by: Rosaria Ferries, MD   Preanesthetic Checklist Completed: patient identified, IV checked, site marked, risks and benefits discussed, surgical consent, monitors and equipment checked, pre-op evaluation and timeout performed Spinal Block Patient position: sitting Prep: Betadine Patient monitoring: heart rate, continuous pulse ox, blood pressure and cardiac monitor Approach: midline Location: L4-5 Injection technique: single-shot Needle Needle type: Whitacre and Introducer  Needle gauge: 24 G Needle length: 9 cm Assessment Events: CSF return Additional Notes Negative paresthesia. Negative blood return. Positive free-flowing CSF. Expiration date of kit checked and confirmed. Patient tolerated procedure well, without complications.

## 2023-11-22 NOTE — Anesthesia Preprocedure Evaluation (Signed)
 Anesthesia Evaluation  Patient identified by MRN, date of birth, ID band Patient awake    Reviewed: Allergy & Precautions, NPO status , Patient's Chart, lab work & pertinent test results  History of Anesthesia Complications (+) PONV and history of anesthetic complications  Airway Mallampati: III  TM Distance: <3 FB Neck ROM: full    Dental  (+) Chipped   Pulmonary neg pulmonary ROS, neg shortness of breath   Pulmonary exam normal        Cardiovascular Exercise Tolerance: Good hypertension, (-) angina (-) Past MI Normal cardiovascular exam     Neuro/Psych  Headaches  negative psych ROS   GI/Hepatic negative GI ROS, Neg liver ROS,neg GERD  ,,  Endo/Other  Hypothyroidism    Renal/GU      Musculoskeletal   Abdominal   Peds  Hematology negative hematology ROS (+)   Anesthesia Other Findings Patient reports chronic lumbar back pain  Past Medical History: No date: AC (acromioclavicular) joint bone spurs     Comment:  feet No date: Anxiety No date: Complication of anesthesia No date: Diverticulosis of colon (without mention of hemorrhage) No date: Encounter for long-term (current) use of high-risk medication No date: Epicondylitis, lateral     Comment:  right No date: Gout, unspecified No date: Headache No date: History of compression fracture of spine No date: History of shingles No date: Hypothyroidism     Comment:  per pt, was dx in 1978, previosly on Synthroid, but has               not taken it in years No date: Irritable bowel syndrome No date: Migraine headache No date: Migraine-cluster headache syndrome No date: Morbid obesity (HCC) 11/13/2023: Nose colonized with MRSA     Comment:  a.) pursurgical PCR (+) 11/13/2023 prior to RIGHT THR No date: Obesity, unspecified No date: Osteoarthrosis, unspecified whether generalized or localized,  unspecified site     Comment:  knees, cervical spine No date:  Other abnormal glucose No date: Other and unspecified hyperlipidemia No date: Other psoriasis No date: Plantar fasciitis No date: PONV (postoperative nausea and vomiting)     Comment:  nausea only with foot surgery in Jan. 2016, otherwise no              nausea No date: Psoriatic arthritis mutilans (HCC)     Comment:  methotrexate No date: Rosacea No date: Shingles No date: Spinal stenosis of lumbar region with neurogenic claudication No date: Stress fracture of right foot No date: TMJ (temporomandibular joint disorder) No date: Unspecified essential hypertension  Past Surgical History: No date: ABDOMINAL HYSTERECTOMY 2005: c-s surgery No date: CARPAL TUNNEL RELEASE     Comment:  bilateral No date: CERVICAL DISC SURGERY     Comment:  C5, C6 No date: CHOLECYSTECTOMY 09/10/2015: COLONOSCOPY; N/A     Comment:  Procedure: COLONOSCOPY;  Surgeon: Christena Deem, MD;              Location: [facility] ENDOSCOPY;  Service: Endoscopy;                Laterality: N/A; No date: ESOPHAGOGASTRODUODENOSCOPY     Comment:  had esophageal stricture and swallowing study 09/2014: FOOT FRACTURE SURGERY; Left     Comment:  and bunionectomy 07/2005: LAPAROSCOPIC SUPRACERVICAL HYSTERECTOMY     Comment:  Fibroid tumor and ovarian cyst (one ovary and cervix               left) 2006: NECK SURGERY 2004: OOPHORECTOMY  Comment:  1 ovary removed No date: OTHER SURGICAL HISTORY     Comment:  history of gastric partition for weight loss No date: OVARIAN CYST REMOVAL years ago: STOMACH SURGERY     Comment:  for weight loss--ruptured incision 09/22/2015: TOTAL KNEE ARTHROPLASTY; Right     Comment:  Procedure: TOTAL KNEE ARTHROPLASTY;  Surgeon: Erin Sons, MD;  Location: [facility] ORS;  Service: Orthopedics;              Laterality: Right; 12/11/2019: TOTAL KNEE ARTHROPLASTY; Left     Comment:  Procedure: LEFT TOTAL KNEE ARTHROPLASTY;  Surgeon: Kennedy Bucker, MD;  Location:  [facility] ORS;  Service: Orthopedics;               Laterality: Left;     Reproductive/Obstetrics negative OB ROS                             Anesthesia Physical Anesthesia Plan  ASA: 3  Anesthesia Plan: Spinal   Post-op Pain Management:    Induction:   PONV Risk Score and Plan:   Airway Management Planned: Natural Airway and Nasal Cannula  Additional Equipment:   Intra-op Plan:   Post-operative Plan:   Informed Consent: I have reviewed the patients History and Physical, chart, labs and discussed the procedure including the risks, benefits and alternatives for the proposed anesthesia with the patient or authorized representative who has indicated his/her understanding and acceptance.     Dental Advisory Given  Plan Discussed with: Anesthesiologist, CRNA and Surgeon  Anesthesia Plan Comments: (Patient reports no bleeding problems and no anticoagulant use.  Plan for spinal with backup GA  Patient consented for risks of anesthesia including but not limited to:  - adverse reactions to medications - damage to eyes, teeth, lips or other oral mucosa - nerve damage due to positioning  - risk of bleeding, infection and or nerve damage from spinal that could lead to paralysis - risk of headache or failed spinal - damage to teeth, lips or other oral mucosa - sore throat or hoarseness - damage to heart, brain, nerves, lungs, other parts of body or loss of life  Patient voiced understanding and assent.)       Anesthesia Quick Evaluation

## 2023-11-22 NOTE — Op Note (Signed)
 Patient Name: Michelle Jordan  JYN:[id number]  Pre-Operative Diagnosis: Right hip Osteoarthritis  Post-Operative Diagnosis: (same)  Procedure: Right Total Hip Arthroplasty  Components/Implants: Cup: Trident Tritanium Clusterhole 48/D w/ x2 screws    Liner: Neutral X3 poly 36/D  Stem: Insignia #5 Std  Head:Biolox Ceramic 36 -2.65mm  Date of Surgery: 11/22/2023  Surgeon: Reinaldo Berber MD  Assistant: Amador Cunas PA (present and scrubbed throughout the case, critical for assistance with exposure, retraction, instrumentation, and closure), [facility]   Anesthesiologist: Piscitello  Anesthesia: Spinal   EBL: 150cc  IVF:600cc  Complications: None   Brief history: The patient is a 68 year old female with a history of osteoarthritis of the right hip with pain limiting their range of motion and activities of daily living, which has failed multiple attempts at conservative therapy.  The risks and benefits of total hip arthroplasty as definitive surgical treatment were discussed with the patient, who opted to proceed with the operation.  After outpatient medical clearance and optimization was completed the patient was admitted to [facility] for the procedure.  All preoperative films were reviewed and an appropriate surgical plan was made prior to surgery.   Description of procedure: The patient was brought to the operating room where laterality was confirmed by all those present to be the right side.  The patient was administered spinal anesthesia on a stretcher prior to being moved supine on the operating room table. Patient was given an intravenous dose of antibiotics for surgical prophylaxis and TXA.  All bony prominences and extremities were well padded and the patient was securely attached to the table boots, a perineal post was placed and the patient had a safety strap placed.  Surgical site was prepped with alcohol and chlorhexidine. The surgical site over the hip  was and draped in typical sterile fashion with multiple layers of adhesive and nonadhesive drapes.  The incision site was marked out with a sterile marker and care was taken to assess the position of the ASIS and ensure appropriate position for the incision.    A surgical timeout was then called with participation of all staff in the room the patient was then a confirmed again and laterality confirmed.  Incision was made over the anterior lateral aspect of the proximal thigh in line with the TFL.  Appropriate retractors were placed and all bleeding vessels were coagulated within the subcutaneous and fatty layers.  An incision was made in the TFL fascia in the interval was carefully identified.  The lateral ascending branches of the circumflex vessels were identified, cauterized and carefully dissected. The main vessels were then tied with a 0 silk hand tie.  Retractors were placed around the superior lateral and inferior medial aspects of the femoral neck and a capsulotomy was performed exposing the hip joint.  Retraction stitches were placed and the capsulotomy to assist with visualization.  Femoral neck cut was then made and the femoral head was extracted after placing the leg in traction.  Bone wax was then applied to the proximal cut surface of the femur and aqua mantis was used to address any bleeding around the femoral neck cut.  Retractors were then placed around the acetabulum to fully visualize the joint space, and the remaining labral tissue was removed and pulvinar was removed.   The acetabulum was then sequentially reamed up to the appropriate size in order to get good fit and fill for the acetabular component while under fluoroscopic guidance.  Acetabular component was then placed and malleted  into a secure fit while confirming position and abduction angle and anteversion utilizing fluoroscopy.  2 screws were then placed in the acetabular cup to assist in securing the cup in place. The cup was  irrigated,  a real neutral liner was placed, impacted, and checked for stability. The femur traction was dropped and sequentially externally rotated while performing a release of the posterior and superomedial tissues off of the proximal femur to allow for mobility, care was taken to preserve the external rotators and piriformis attachments.  The remaining interval between the abductors and the capsule was dissected out and a retractor was placed over the superolateral aspect of the femur over the greater trochanter.  The leg was carefully brought down into extension and adducted to provide visualization of the proximal femur for broaching.  The femur was then sequentially broached up to an appropriate size which provided for good fill and stability to the femoral broach.  A trial neck and head were placed on the femoral broach and the leg was brought up for reduction.  The hip was reduced and manual check of stability was performed.  The hip was found to be stable in flexion internal rotation and extension external rotation.  Leg lengths were confirmed on fluoroscopy.   The hip was then dislocated the trial neck and head were removed.  The leg was then brought down into extension and adduction in the proximal femur was reexposed.  The broach trial was removed and the femur was irrigated with normal saline prior to the real femoral stem being implanted.  After the femoral stem was seated and shown to have good fit and fill the appropriate head was impacted the leg was brought up and reduced.  There was good range of motion with stability in flexion internal rotation and extension external rotation on testing.  Leg lengths were found to be appropriate on fluoroscopic evaluation at this time.  The hip was then irrigated with betdine based surgiphor solution and then saline solution.  The capsulotomy was repaired with Ethibond sutures.  A pericapsular and peritrochanteric cocktail with Exparel and bupivacaine was  then injected as well as the subcutaneous tissues. The fascia was closed with a #1 barbed running suture.  The deep tissues were closed with Vicryl sutures the subcutaneous tissues were closed with interrupted Vicryl sutures and a running barbed 4-0 suture.  The skin was then reinforced with Dermabond and a sterile dressing was placed.   The patient was awoken from anesthesia transferred off of the operating room table onto a hospital bed where examination of leg lengths found the leg lengths to be equal with a good distal pulse.  The patient was then transferred to the PACU in stable condition.

## 2023-11-22 NOTE — H&P (Signed)
 History of Present Illness: Michelle Jordan is an 68 y.o. female presents for follow-up evaluation of her right hip prior to planned right total hip replacement. The patient is undergone conservative treatments with injections physical therapy and medications and continues to have severe pain in her right hip which limits her function on a daily basis up to a 10 out of 10 at its worst and about a 4-10 at rest today. She is continue to work on her weight loss and is down even further with a BMI now at 41. As her right hip has functionally limited her in her life we are here to discuss moving forward with right hip replacement.  The patient is a nondiabetic with the most recent A1c of 5.1 she is a non-smoker or tobacco user and her BMI is down to 41  Past Medical History: Past Medical History:  Diagnosis Date  Diverticulosis 09/10/2015  History of carpal tunnel release 04/21/2014  History of migraine headaches 04/21/2014  Hyperlipidemia  Hypothyroidism  She stated this was diagnosed in l978, she was previously on Synthroid, however, has not been on this medication for years.  IBS (irritable bowel syndrome)  Iritis  Migraine-cluster headache syndrome  Obesity  Osteoarthritis  a. Knees, b. Cervical spine  Psoriasis  Psoriatic arthritis (CMS/HHS-HCC)  a. Methotrexate  Right lateral epicondylitis 04/21/2014  Rosacea  S/P removal of ovarian cyst 04/21/2014  Right  Shingles  Statin intolerance 01/27/2021  Stress fracture of right foot 04/21/2014   Past Surgical History: Past Surgical History:  Procedure Laterality Date  CHOLECYSTECTOMY 1984  Cervical disc surgery 04-14-2004  C-5, C-6  bunionectomy/non union jones Left 09/2014  COLONOSCOPY 09/10/2015  Diverticulosis/Repeat 20yrs/MUS  total knee arthroplasty Right 09/22/2015  Carpal tunnel syndrome Bilateral  History of gastric partition  for weight loss  HYSTERECTOMY  OOPHORECTOMY Right  Removal of ovarian cyst Right   Past Family  History: Family History  Problem Relation Age of Onset  Diabetes type II Mother  High blood pressure (Hypertension) Mother  Rheum arthritis Mother  High blood pressure (Hypertension) Father  Myocardial Infarction (Heart attack) Father  Esophageal cancer Maternal Aunt   Medications: Current Outpatient Medications  Medication Sig Dispense Refill  acetaminophen (TYLENOL) 500 MG tablet Take 2,000 mg by mouth  adalimumab (HUMIRA) 40 mg/0.4 mL pen injector kit Inject 40 mg subcutaneously every 14 (fourteen) days 1 kit 0  adalimumab (HUMIRA) 40 mg/0.4 mL pen injector kit Inject 40 mg subcutaneously every 14 (fourteen) days 2 kit 0  adalimumab (HUMIRA,CF, PEN) 40 mg/0.4 mL pen injector kit Inject 40 mg subcutaneously every 14 (fourteen) days 6 kit 1  ALPRAZolam (XANAX) 0.5 MG tablet Take 1 tablet (0.5 mg total) by mouth once daily as needed 30 tablet 2  calcium carbonate (CALTRATE 600 ORAL) Take 1 tablet by mouth once daily  clobetasoL (TEMOVATE) 0.05 % ointment Apply topically twice a week 1 liberally to affected area twice a week 60 g 3  dicyclomine (BENTYL) 20 mg tablet TAKE 1 TABLET BY MOUTH EVERY 6 HOURS. 28 tablet 1  docusate (COLACE) 100 MG capsule Take 100 mg by mouth once daily  ergocalciferol, vitamin D2, 1,250 mcg (50,000 unit) capsule TAKE 1 CAPSULE (50,000 UNITS TOTAL) BY MOUTH ONCE A WEEK FOR 180 DAYS 12 capsule 1  ezetimibe (ZETIA) 10 mg tablet TAKE 1 TABLET (10 MG TOTAL) BY MOUTH EVERY DAY FOR 180 DAYS 30 tablet 5  folic acid (FOLVITE) 1 MG tablet Take 1mg  by mouth daily 90 tablet 3  meloxicam (  MOBIC) 15 MG tablet TAKE 1 TABLET BY MOUTH EVERY DAY AS NEEDED FOR PAIN 30 tablet 5  methotrexate (RHEUMATREX) 2.5 MG tablet Take 6 tablets (15 mg total) by mouth every 7 (seven) days 24 tablet 1  phentermine-topiramate (QSYMIA) 7.5-46 mg ER capsule Take 1 capsule by mouth every morning for 180 days 30 capsule 5  promethazine (PHENERGAN) 25 MG tablet TAKE 1 TABLET BY MOUTH ONCE DAILY AS  NEEDED FOR NAUSEA. 30 tablet 3  tiZANidine (ZANAFLEX) 2 MG tablet Take 1 tablet (2 mg total) by mouth 3 (three) times daily as needed 60 tablet 3  traZODone (DESYREL) 100 MG tablet Take 2 tablets (200 mg total) by mouth at bedtime for 180 days 60 tablet 5   No current facility-administered medications for this visit.   Allergies: Allergies  Allergen Reactions  Gabapentin Other (See Comments)  Depression, anxiety, brain fog, insomnia  Etodolac Other (See Comments)  Difficulty moving/walking  Isometh-Dichloral-Acetaminophn Other (See Comments)  tingled REACTION: caused tingling  Penicillin Other (See Comments)  Can only take sulfamethoxazole TMP DS tablet due to being on Methotrexate per RA physician  Spironolactone Other (See Comments)  REACTION: gout  Statins-Hmg-Coa Reductase Inhibitors Other (See Comments)  Amitriptyline Other (See Comments) and Anxiety  Mood swings moodiness    Visit Vitals: Vitals:  11/21/23 1002  BP: 124/82    Review of Systems:  A comprehensive 14 point ROS was performed, reviewed, and the pertinent orthopaedic findings are documented in the HPI.  Physical Exam: Body mass index is 41.02 kg/m. General/Constitutional: No apparent distress: well-nourished and well developed. Lymphatic: No palpable adenopathy. Pulmonary exam: Lungs clear to auscultation bilaterally no wheezing rales or rhonchi Cardiac exam: Regular rate and rhythm no obvious murmurs rubs or gallops. Vascular: No edema, swelling or tenderness, except as noted in detailed exam. Integumentary: No impressive skin lesions present, except as noted in detailed exam. Neuro/Psych: Normal mood and affect, oriented to person, place and time. Musculoskeletal: Normal, except as noted in detailed exam and in HPI.  Right hip exam  SKIN: intact SWELLING: none WARMTH: no warmth TENDERNESS: Mild tenderness over the lateral aspect the greater trochanter, Stinchfield Positive ROM: 10 degrees  internal rotation and 30 degrees external rotation and pain with internal rotation,; Hip Flexion 80 due to soft tissue impingement STRENGTH: normal GAIT: antalgic with a rolling walker STABILITY: stable to testing CREPITUS: no LEG LENGTH DISCREPANCY: left longer by .5 cm NEUROLOGICAL EXAM: normal VASCULAR EXAM: normal LUMBAR SPINE: tenderness: yes paraspinal straight leg raising sign: no motor exam: normal  The contralateral hip was examined for comparison and it showed: TENDERNESS: none ROM: normal and full STRENGTH: normal STABILITY: stable to testing  Hip Imaging :  I reviewed AP pelvis and lateral x-rays of the bilateral hips performed on 08/15/2023 images reviewed myself. There is bilateral central degenerative changes with medial joint space narrowing and inferior sclerosis as well as superior acetabular sclerosis worse in the left hip than the right. The right hip does have some flattening of the femoral head on the frog-leg view. Moderate degenerative changes to the bilateral hips.  MRI of the right hip performed on 08/18/2023 images and report reviewed by myself. There is high-grade cartilage loss throughout the entire right femoral head and acetabulum with a small effusion and some debris. There is edema within the femoral head and flattening along the superior and lateral aspect of the head consistent with some chondral insufficiency injury. Severe right hip arthritis with subchondral insufficiency injury agree radiology interpretation. Full  radiologist report below.  Narrative & Impression  MRI HIP RIGHT WITHOUT CONTRAST  Comparison: None  Indication: Hip pain, chronic, tendon/ligament abnormality suspected, xray done, M76.891 Other specified enthesopathies of right lower limb, excluding foot  Technique: Multiplanar MR sequences of the right hip were performed without contrast.  Findings: There is diffuse high-grade articular cartilage loss to bone throughout  the right femoral head and right acetabulum.   Small right hip joint effusion synovitis and debris is present.   Marked marrow edema is present throughout the right femoral head with a few regions of adjacent subchondral flattening and slight T1 hypointense subchondral signal abnormality.  Mild hypertrophic ridging and productive change about the pubic symphysis and sacroiliac joints.  Mild gluteus minimus and medius tendinosis without discrete tear defect.  Unremarkable rectus femoris, iliopsoas, hamstring and adductor origins.  Diffuse degenerative blunting tearing and fraying of the right hip labrum.  Impression: Diffuse high-grade articular cartilage loss to bone throughout the right femoral head and right acetabulum.  Small right hip joint effusion synovitis and debris is present.  Marked marrow edema is present throughout the right femoral head with a few regions of adjacent subchondral flattening and slight T1 hypointense subchondral signal abnormality. Findings could represent subchondral stress response versus subchondral stress/insufficiency fracture, transient osteoporosis of the hip is considered less likely.  Mild gluteus minimus and medius tendinosis without discrete tear defect.  Electronically Signed by: Virgilio Frees, MD Electronically Signed on: 08/20/2023 2:30 PM   Assessment:  Right hip osteoarthritis  Plan: Vittoria is a 68 year old female presents with right hip arthritis with degenerative changes which is failed to improve with conservative treatments including injections exercise medications and weight loss. Patient has successfully lost weight and got her weight down closer to a BMI of 40. We discussed that as long as she continues to lose weight then we can move forward with surgical intervention for her hip. Based upon the patient's continued symptoms and failure to respond to conservative treatment, I have recommended a right total hip replacement for  this patient. A long discussion took place with the patient describing what a total joint replacement is and what the procedure would entail. A hip model, similar to the implants that will be used during the operation, was utilized to demonstrate the implants. Choices of implant manufactures were discussed and reviewed. The ability to secure the implant utilizing cement or cementless (press fit) fixation was discussed. Anterior and posterior exposures were discussed. For this patient an appropriate approach will be anterior.   The hospitalization and post-operative care and rehabilitation were also discussed. The use of perioperative antibiotics and DVT prophylaxis were discussed. The risk, benefits and alternatives to a surgical intervention were discussed at length with the patient. The patient was also advised of risks related to the medical comorbidities and elevated body mass index (BMI). A lengthy discussion took place to review the most common complications including but not limited to: deep vein thrombosis, pulmonary embolus, heart attack, stroke, infection, wound breakdown, heterotopic ossification, dislocation, numbness, leg length in-equality, intraoperative fracture, damage to nerves, tendon,muscles, arteries or other blood vessels, death and other possible complications from anesthesia. The patient was told that we will take steps to minimize these risks by using sterile technique, antibiotics and DVT prophylaxis when appropriate and follow the patient postoperatively in the office setting to monitor progress. The possibility of recurrent pain, no improvement in pain and actual worsening of pain were also discussed with the patient. The risk of dislocation following total hip  replacement was discussed and potential precautions to prevent dislocation were reviewed.   Patient asked about and confirms no history of any reactions to metal or metal allergy in the past.  The discharge plan of care  focused on the patient going home following surgery. The patient was encouraged to make the necessary arrangements to have someone stay with them when they are discharged home.   The benefits of surgery were discussed with the patient including the potential for improving the patient's current clinical condition through operative intervention. Alternatives to surgical intervention including continued conservative management were also discussed in detail. All questions were answered to the satisfaction of the patient. The patient participated and agreed to the plan of care as well as the use of the recommended implants for their total hip replacement surgery. An information packet was given to the patient to review prior to surgery.   The patient received clearance for surgery. All questions answered patient agrees with above plan make preparations for right anterior total hip replacement. Her Humira has been held for 2 weeks and we will hold her next dose postoperatively per [employer] of rheumatology guidelines.    Portions of this record have been created using Scientist, clinical (histocompatibility and immunogenetics). Dictation errors have been sought, but may not have been identified and corrected.  Reinaldo Berber MD

## 2023-11-22 NOTE — Plan of Care (Signed)
  Problem: Activity: Goal: Ability to avoid complications of mobility impairment will improve Outcome: Progressing Goal: Ability to tolerate increased activity will improve Outcome: Progressing   Problem: Clinical Measurements: Goal: Postoperative complications will be avoided or minimized Outcome: Progressing   Problem: Pain Management: Goal: Pain level will decrease with appropriate interventions Outcome: Progressing   Problem: Skin Integrity: Goal: Will show signs of wound healing Outcome: Progressing   

## 2023-11-22 NOTE — Interval H&P Note (Signed)
 Patient history and physical updated. Consent reviewed including risks, benefits, and alternatives to surgery. Patient agrees with above plan to proceed with right anterior total hip arthroplasty.

## 2023-11-22 NOTE — Transfer of Care (Signed)
 Immediate Anesthesia Transfer of Care Note  Patient: Michelle Jordan  Procedure(s) Performed: TOTAL HIP ARTHROPLASTY ANTERIOR APPROACH (Right: Hip)  Patient Location: PACU  Anesthesia Type:General  Level of Consciousness: awake, alert , and patient cooperative  Airway & Oxygen Therapy: Patient Spontanous Breathing  Post-op Assessment: Report given to RN and Post -op Vital signs reviewed and stable  Post vital signs: stable  Last Vitals:  Vitals Value Taken Time  BP 132/57 11/22/23 1507  Temp 36 C 11/22/23 1510  Pulse 80 11/22/23 1511  Resp 21 11/22/23 1511  SpO2 99 % 11/22/23 1511  Vitals shown include unfiled device data.  Last Pain:  Vitals:   11/22/23 1139  PainSc: 0-No pain      Patients Stated Pain Goal: 0 (11/22/23 1139)  Complications: No notable events documented.

## 2023-11-23 ENCOUNTER — Encounter: Payer: Self-pay | Admitting: Orthopedic Surgery

## 2023-11-23 DIAGNOSIS — M1611 Unilateral primary osteoarthritis, right hip: Secondary | ICD-10-CM | POA: Diagnosis not present

## 2023-11-23 LAB — BASIC METABOLIC PANEL
Anion gap: 6 (ref 5–15)
BUN: 15 mg/dL (ref 8–23)
CO2: 24 mmol/L (ref 22–32)
Calcium: 8.5 mg/dL — ABNORMAL LOW (ref 8.9–10.3)
Chloride: 105 mmol/L (ref 98–111)
Creatinine, Ser: 0.62 mg/dL (ref 0.44–1.00)
GFR, Estimated: >60 mL/min (ref >60)
Glucose, Bld: 152 mg/dL — ABNORMAL HIGH (ref 70–99)
Potassium: 4 mmol/L (ref 3.5–5.1)
Sodium: 135 mmol/L (ref 135–145)

## 2023-11-23 LAB — CBC
HCT: 35.1 % — ABNORMAL LOW (ref 36.0–46.0)
Hemoglobin: 11.8 g/dL — ABNORMAL LOW (ref 12.0–15.0)
MCH: 32 pg (ref 26.0–34.0)
MCHC: 33.6 g/dL (ref 30.0–36.0)
MCV: 95.1 fL (ref 80.0–100.0)
Platelets: 127 10*3/uL — ABNORMAL LOW (ref 150–400)
RBC: 3.69 MIL/uL — ABNORMAL LOW (ref 3.87–5.11)
RDW: 13.2 % (ref 11.5–15.5)
WBC: 11.3 10*3/uL — ABNORMAL HIGH (ref 4.0–10.5)
nRBC: 0 % (ref 0.0–0.2)

## 2023-11-23 MED ORDER — CELECOXIB 200 MG PO CAPS: 200.0000 mg | ORAL_CAPSULE | Freq: Two times a day (BID) | ORAL | 0 refills | Status: AC

## 2023-11-23 MED ORDER — ENOXAPARIN SODIUM 40 MG/0.4ML IJ SOSY
40.0000 mg | PREFILLED_SYRINGE | INTRAMUSCULAR | 0 refills | Status: AC
Start: 2023-11-23 — End: 2023-12-07

## 2023-11-23 MED ORDER — CAPSAICIN 0.025 % EX CREA: TOPICAL_CREAM | Freq: Two times a day (BID) | CUTANEOUS | 0 refills | Status: AC

## 2023-11-23 MED ORDER — CAPSAICIN 0.025 % EX CREA
TOPICAL_CREAM | Freq: Two times a day (BID) | CUTANEOUS | Status: DC
  Filled 2023-11-23: qty 1

## 2023-11-23 MED ORDER — ACETAMINOPHEN 500 MG PO TABS: 1000.0000 mg | ORAL_TABLET | Freq: Three times a day (TID) | ORAL | 0 refills | Status: AC

## 2023-11-23 MED ORDER — OXYCODONE HCL 5 MG PO TABS: 2.5000 mg | ORAL_TABLET | Freq: Three times a day (TID) | ORAL | 0 refills | Status: AC | PRN

## 2023-11-23 MED ORDER — TRAMADOL HCL 50 MG PO TABS: 50.0000 mg | ORAL_TABLET | Freq: Four times a day (QID) | ORAL | 0 refills | Status: AC | PRN

## 2023-11-23 MED ORDER — ONDANSETRON HCL 4 MG PO TABS: 4.0000 mg | ORAL_TABLET | Freq: Four times a day (QID) | ORAL | 0 refills | Status: AC | PRN

## 2023-11-23 NOTE — TOC Initial Note (Signed)
 Transition of Care [facility]) - Initial/Assessment Note    Patient Details  Name: Michelle Jordan MRN: [id number] Date of Birth: 22-Apr-1956  [facility] CM/SW Contact:    Marlowe Sax, RN Phone Number: 11/23/2023, 3:43 PM  Clinical Narrative:                    Patient is set u0p with [facility] prior to surgery by surgeons office     Patient Goals and CMS Choice            Expected Discharge Plan and Services         Expected Discharge Date: 11/23/23                                    Prior Living Arrangements/Services                       Activities of Daily Living   ADL Screening (condition at time of admission) Independently performs ADLs?: Yes (appropriate for developmental age) Is the patient deaf or have difficulty hearing?: No Does the patient have difficulty seeing, even when wearing glasses/contacts?: No Does the patient have difficulty concentrating, remembering, or making decisions?: No  Permission Sought/Granted                  Emotional Assessment              Admission diagnosis:  S/P total right hip arthroplasty [Z96.641] Patient Active Problem List   Diagnosis Date Noted   S/P total right hip arthroplasty 11/22/2023   Tachycardia 12/12/2019   Knee joint replacement status, left 12/11/2019   S/P total knee replacement 09/22/2015   Preoperative examination 09/28/2014   Foot fracture, left 08/18/2014   Migraine 08/18/2014   Lipoma 07/28/2011   Anxiety 07/28/2011   GOUT, UNSPECIFIED 03/09/2010   Pain in limb 07/13/2008   Hyperlipidemia 02/12/2007   OBESITY NOS 02/12/2007   IRITIS 02/12/2007   Essential hypertension 02/12/2007   Diverticulosis of colon 02/12/2007   IBS 02/12/2007   ROSACEA 02/12/2007   PSORIASIS NEC 02/12/2007   Osteoarthritis 02/12/2007   PCP:  Barbette Reichmann, MD Pharmacy:   [facility] - [place], [place] - [address] [place] [address] Phone:  [phone] Fax: [phone]  [id number] [place], [place] - [address] [place] [address] Phone: [phone] Fax: [phone]     Social Drivers of Health (SDOH) Social History: SDOH Screenings   Food Insecurity: No Food Insecurity (11/22/2023)  Housing: Low Risk  (11/22/2023)  Transportation Needs: No Transportation Needs (11/22/2023)  Utilities: Not At Risk (11/22/2023)  Financial Resource Strain: Low Risk  (10/05/2023)   Received from [facility]  Social Connections: Moderately Integrated (11/22/2023)  Tobacco Use: Low Risk  (11/22/2023)   SDOH Interventions:     Readmission Risk Interventions     No data to display

## 2023-11-23 NOTE — Progress Notes (Signed)
 Nsg Discharge Note  Admit Date:  11/22/2023 Discharge date: 11/23/2023   Almee Pelphrey to be D/C'd Home per MD order.  AVS completed.  Patient/caregiver able to verbalize understanding.  Discharge Medication: Allergies as of 11/23/2023       Reactions   Gabapentin Other (See Comments)   Depression, anxiety, brain fog, insomnia   Etodolac Other (See Comments)   Patient is unsure (cannot remember) reaction type   Isometheptene-dichloral-apap    (Midrin)Caused tingling   Penicillins Other (See Comments)   Can't take due to being on methotrexate    Spironolactone Other (See Comments)   Gout   Statins Other (See Comments)   Calf/leg pain   Amitriptyline Anxiety   moodiness        Medication List     STOP taking these medications    meloxicam 15 MG tablet Commonly known as: MOBIC       TAKE these medications    acetaminophen 500 MG tablet Commonly known as: TYLENOL Take 2 tablets (1,000 mg total) by mouth every 8 (eight) hours. What changed:  how much to take when to take this   adalimumab 40 MG/0.4ML pen Commonly known as: HUMIRA Inject 40 mg into the skin every 14 (fourteen) days. Every other Thursday   ALPRAZolam 0.5 MG tablet Commonly known as: XANAX Take 1 tablet (0.5 mg total) by mouth at bedtime as needed. for sleep What changed:  when to take this reasons to take this additional instructions   Biotin 91478 MCG Tabs Take 1 tablet by mouth daily.   bisacodyl 5 MG EC tablet Commonly known as: DULCOLAX Take 10 mg by mouth daily as needed for moderate constipation.   calcium carbonate 1500 (600 Ca) MG Tabs tablet Commonly known as: OSCAL Take by mouth 2 (two) times daily with a meal.   capsaicin 0.025 % cream Commonly known as: ZOSTRIX Apply topically 2 (two) times daily.   celecoxib 200 MG capsule Commonly known as: CeleBREX Take 1 capsule (200 mg total) by mouth 2 (two) times daily for 10 days.   chlorhexidine 4 % external liquid Commonly  known as: HIBICLENS Apply 15 mLs (1 Application total) topically as directed for 30 doses. Use as directed daily for 5 days every other week for 6 weeks.   clobetasol cream 0.05 % Commonly known as: TEMOVATE Apply 1 application topically 2 (two) times daily as needed (psoriasis).   dicyclomine 20 MG tablet Commonly known as: BENTYL Take 1 tablet by mouth every 6 (six) hours as needed for spasms.   docusate sodium 100 MG capsule Commonly known as: COLACE Take 100 mg by mouth daily.   enoxaparin 40 MG/0.4ML injection Commonly known as: LOVENOX Inject 0.4 mLs (40 mg total) into the skin daily for 14 days.   ezetimibe 10 MG tablet Commonly known as: ZETIA TAKE 1 TABLET (10 MG TOTAL) BY MOUTH EVERY DAY FOR 180 DAYS   folic acid 1 MG tablet Commonly known as: FOLVITE Take 1 mg by mouth at bedtime.   methotrexate 2.5 MG tablet Commonly known as: RHEUMATREX Take 15 mg by mouth every Thursday.   mupirocin ointment 2 % Commonly known as: BACTROBAN Apply small amount to the inside of both nostrils TWICE a day for the next 5 days. What changed: Another medication with the same name was added. Make sure you understand how and when to take each.   mupirocin ointment 2 % Commonly known as: BACTROBAN Place 1 Application into the nose 2 (two) times daily for  60 doses. Use as directed 2 times daily for 5 days every other week for 6 weeks. What changed: You were already taking a medication with the same name, and this prescription was added. Make sure you understand how and when to take each.   ondansetron 4 MG tablet Commonly known as: ZOFRAN Take 1 tablet (4 mg total) by mouth every 6 (six) hours as needed for nausea.   oxyCODONE 5 MG immediate release tablet Commonly known as: Roxicodone Take 0.5-1 tablets (2.5-5 mg total) by mouth every 8 (eight) hours as needed for breakthrough pain.   Qsymia 7.5-46 MG Cp24 Generic drug: Phentermine-Topiramate Take 1 capsule by mouth daily.    tiZANidine 2 MG tablet Commonly known as: ZANAFLEX Take 2 mg by mouth 2 (two) times daily.   traMADol 50 MG tablet Commonly known as: ULTRAM Take 1 tablet (50 mg total) by mouth every 6 (six) hours as needed for moderate pain (pain score 4-6).   traZODone 100 MG tablet Commonly known as: DESYREL Take 200 mg by mouth at bedtime.   Vitamin D (Ergocalciferol) 1.25 MG (50000 UNIT) Caps capsule Commonly known as: DRISDOL Take 50,000 Units by mouth every Thursday.        Discharge Assessment: Vitals:   11/23/23 0440 11/23/23 0819  BP: (!) 124/54 (!) 142/64  Pulse: 61 74  Resp: 16 18  Temp: (!) 97 F (36.1 C) 97.9 F (36.6 C)  SpO2: 94% 96%   Skin clean, dry and intact without evidence of skin break down, no evidence of skin tears noted. IV catheter discontinued intact. Site without signs and symptoms of complications - no redness or edema noted at insertion site, patient denies c/o pain - only slight tenderness at site.  Dressing with slight pressure applied.  D/c Instructions-Education: Discharge instructions given to patient/family with verbalized understanding. D/c education completed with patient/family including follow up instructions, medication list, d/c activities limitations if indicated, with other d/c instructions as indicated by MD - patient able to verbalize understanding, all questions fully answered. Patient instructed to return to ED, call 911, or call MD for any changes in condition.  Patient escorted via WC, and D/C home via private auto.  Theodore Demark, RN 11/23/2023 12:31 PM

## 2023-11-23 NOTE — Progress Notes (Addendum)
 Physical Therapy Treatment Patient Details Name: Michelle Jordan MRN: [id number] DOB: Mar 15, 1956 Today's Date: 11/23/2023   History of Present Illness Patient is a 68 year old female s/p R hip THA on 11/22/23. PMH HLD, hypothyroidism, migraine, obesity,    PT Comments  Patient in bed for afternoon PT session. Agreeable to participate in therapy. Is dressed and ready to go home, awaiting ride and last PT session. Patient is able to perform supine strengthening exercises with decreased need for cueing compared to morning session. She then transitions EOB and stands with good hand placement. She is able to ambulate a further distance this session, turned and returned to room. Patient's significant other is in room waiting to take patient home. Nursing notified. Current POC remains appropriate at this time.     If plan is discharge home, recommend the following: A little help with walking and/or transfers;A little help with bathing/dressing/bathroom;Assistance with cooking/housework;Assist for transportation;Help with stairs or ramp for entrance   Can travel by private vehicle        Equipment Recommendations  None recommended by PT (Patient has necessary equiptment at home)    Recommendations for Other Services       Precautions / Restrictions Precautions Precautions: Anterior Hip Precaution Booklet Issued: Yes (comment) Restrictions Weight Bearing Restrictions Per Provider Order: Yes RLE Weight Bearing Per Provider Order: Weight bearing as tolerated     Mobility  Bed Mobility Overal bed mobility: Modified Independent             General bed mobility comments: extra time for transitioning to EOB    Transfers Overall transfer level: Needs assistance Equipment used: Rolling walker (2 wheels) Transfers: Sit to/from Stand Sit to Stand: Supervision           General transfer comment: slow but safe    Ambulation/Gait Ambulation/Gait assistance: Contact guard assist Gait  Distance (Feet): 100 Feet Assistive device: Rolling walker (2 wheels) Gait Pattern/deviations: Decreased stance time - right, Decreased weight shift to right, Step-through pattern Gait velocity: decreased     General Gait Details: antalgic gait pattern on RLE   Stairs             Wheelchair Mobility     Tilt Bed    Modified Rankin (Stroke Patients Only)       Balance Overall balance assessment: Needs assistance Sitting-balance support: Feet supported, No upper extremity supported Sitting balance-Leahy Scale: Good Sitting balance - Comments: able to don jacket seated without assistance   Standing balance support: Bilateral upper extremity supported, During functional activity Standing balance-Leahy Scale: Fair Standing balance comment: requires hands on walker for mobility, able to static stand without UE support                            Communication Communication Communication: No apparent difficulties  Cognition Arousal: Alert Behavior During Therapy: WFL for tasks assessed/performed   PT - Cognitive impairments: No apparent impairments                       PT - Cognition Comments: A and O x 4 Following commands: Intact      Cueing Cueing Techniques: Verbal cues  Exercises Total Joint Exercises Ankle Circles/Pumps: AROM, Strengthening, Both, Supine, 15 reps Quad Sets: Strengthening, Both, 10 reps, Supine Gluteal Sets: Strengthening, Both, 10 reps, Supine Heel Slides: Strengthening, Right, 10 reps, Supine Hip ABduction/ADduction: AROM, Strengthening, Right, 10 reps, Supine Other Exercises Other Exercises:  Patient educated on role of PT in acute care setting, safe mobility and transfers, anterior hip precautions    General Comments General comments (skin integrity, edema, etc.): post surgical dressing      Pertinent Vitals/Pain Pain Assessment Pain Assessment: 0-10 Pain Score: 2  Pain Location: R hip Pain Descriptors /  Indicators: Pins and needles Pain Intervention(s): Monitored during session, Repositioned    Home Living Family/patient expects to be discharged to:: Private residence Living Arrangements: Spouse/significant other Available Help at Discharge: Family Type of Home: House Home Access: Ramped entrance       Home Layout: One level Home Equipment: Grab bars - tub/shower;Grab bars - toilet;Toilet riser;Shower seat - built Charity fundraiser (2 wheels);Rollator (4 wheels)      Prior Function            PT Goals (current goals can now be found in the care plan section) Acute Rehab PT Goals Patient Stated Goal: to return home PT Goal Formulation: With patient Time For Goal Achievement: 12/07/23 Potential to Achieve Goals: Fair Progress towards PT goals: Progressing toward goals    Frequency    BID      PT Plan      Co-evaluation              AM-PAC PT "6 Clicks" Mobility   Outcome Measure  Help needed turning from your back to your side while in a flat bed without using bedrails?: A Little Help needed moving from lying on your back to sitting on the side of a flat bed without using bedrails?: A Little Help needed moving to and from a bed to a chair (including a wheelchair)?: A Little Help needed standing up from a chair using your arms (e.g., wheelchair or bedside chair)?: None Help needed to walk in hospital room?: A Little Help needed climbing 3-5 steps with a railing? : A Lot 6 Click Score: 18    End of Session Equipment Utilized During Treatment: Gait belt Activity Tolerance: Patient tolerated treatment well;Patient limited by pain Patient left: in bed;with call bell/phone within reach Nurse Communication: Mobility status PT Visit Diagnosis: Unsteadiness on feet (R26.81);Other abnormalities of gait and mobility (R26.89);Muscle weakness (generalized) (M62.81);Difficulty in walking, not elsewhere classified (R26.2);Pain Pain - Right/Left: Right Pain - part of  body: Hip     Time: 7829-5621 PT Time Calculation (min) (ACUTE ONLY): 15 min  Charges:    $Therapeutic Activity: 8-22 mins PT General Charges $$ ACUTE PT VISIT: 1 Visit                    Precious Bard, PT, DPT Physical Therapist - [place]   11/23/2023, 1:36 PM

## 2023-11-23 NOTE — Anesthesia Postprocedure Evaluation (Signed)
 Anesthesia Post Note  Patient: Michelle Jordan  Procedure(s) Performed: TOTAL HIP ARTHROPLASTY ANTERIOR APPROACH (Right: Hip)  Patient location during evaluation: Nursing Unit Anesthesia Type: Spinal Level of consciousness: oriented and awake and alert Pain management: pain level controlled Vital Signs Assessment: post-procedure vital signs reviewed and stable Respiratory status: spontaneous breathing and respiratory function stable Cardiovascular status: blood pressure returned to baseline and stable Postop Assessment: no headache, no backache, no apparent nausea or vomiting and patient able to bend at knees Anesthetic complications: no   There were no known notable events for this encounter.   Last Vitals:  Vitals:   11/23/23 0440 11/23/23 0819  BP: (!) 124/54 (!) 142/64  Pulse: 61 74  Resp: 16 18  Temp: (!) 36.1 C 36.6 C  SpO2: 94% 96%    Last Pain:  Vitals:   11/23/23 0832  TempSrc:   PainSc: 4                  Cleda Mccreedy Izak Anding

## 2023-11-23 NOTE — Evaluation (Signed)
 Physical Therapy Evaluation Patient Details Name: Michelle Jordan MRN: [id number] DOB: December 30, 1955 Today's Date: 11/23/2023  History of Present Illness  Patient is a 68 year old female s/p R hip THA on 11/22/23. PMH HLD, hypothyroidism, migraine, obesity,  Clinical Impression  Patient is a 68 year old female who presents s/p R THA. Prior to hospital admission, pt was modified independent and lives with husband in a modified home that is accessible.   Patient is in bed upon PT arrival and agreeable to participate in evaluation. Patient reports burning "fire ants" of R thigh, nursing is aware. She is educated on anterior hip surgery, given packet with precautions and home exercise program. Patient demonstrates understanding of home program. Patient transitioned EOB with extra time. She is able to don a jacket in seated and then zip in standing without LOB. She then ambulates with antalgic gait pattern with RW into hallway with cue for breathing and weight shift. Patient returned to room with needs met.  Pt would benefit from skilled PT to address noted impairments and functional limitations (see below for any additional details).          If plan is discharge home, recommend the following: A little help with walking and/or transfers;A little help with bathing/dressing/bathroom;Assistance with cooking/housework;Assist for transportation;Help with stairs or ramp for entrance   Can travel by private vehicle        Equipment Recommendations None recommended by PT (Patient has necessary equiptment at home)  Recommendations for Other Services       Functional Status Assessment Patient has had a recent decline in their functional status and demonstrates the ability to make significant improvements in function in a reasonable and predictable amount of time.     Precautions / Restrictions Precautions Precautions: Anterior Hip Precaution Booklet Issued: Yes (comment) Restrictions Weight Bearing  Restrictions Per Provider Order: Yes RLE Weight Bearing Per Provider Order: Weight bearing as tolerated      Mobility  Bed Mobility Overal bed mobility: Modified Independent             General bed mobility comments: extra time for transitioning to EOB    Transfers Overall transfer level: Needs assistance Equipment used: Rolling walker (2 wheels) Transfers: Sit to/from Stand Sit to Stand: Supervision, Contact guard assist           General transfer comment: slow but safe    Ambulation/Gait Ambulation/Gait assistance: Contact guard assist Gait Distance (Feet): 60 Feet Assistive device: Rolling walker (2 wheels) Gait Pattern/deviations: Decreased stance time - right, Decreased weight shift to right, Step-through pattern Gait velocity: decreased     General Gait Details: antalgic gait pattern on RLE  Stairs            Wheelchair Mobility     Tilt Bed    Modified Rankin (Stroke Patients Only)       Balance Overall balance assessment: Needs assistance Sitting-balance support: Feet supported, No upper extremity supported Sitting balance-Leahy Scale: Good Sitting balance - Comments: able to don jacket seated without assistance   Standing balance support: Bilateral upper extremity supported, During functional activity Standing balance-Leahy Scale: Fair Standing balance comment: requires hands on walker for mobility, able to static stand and zip up jacket without UE support                             Pertinent Vitals/Pain Pain Assessment Pain Assessment: 0-10 Pain Score: 4  Pain Location: R hip Pain  Descriptors / Indicators: Pins and needles Pain Intervention(s): Limited activity within patient's tolerance, Repositioned, Monitored during session    Home Living Family/patient expects to be discharged to:: Private residence Living Arrangements: Spouse/significant other Available Help at Discharge: Family Type of Home: House Home  Access: Ramped entrance       Home Layout: One level Home Equipment: Grab bars - tub/shower;Grab bars - toilet;Toilet riser;Shower seat - built Charity fundraiser (2 wheels);Rollator (4 wheels)      Prior Function Prior Level of Function : Independent/Modified Independent             Mobility Comments: has history of mobility impairments, house set up for accessibility ADLs Comments: independent with modifications made to home (shower seat, raised toilet, etc)     Extremity/Trunk Assessment   Upper Extremity Assessment Upper Extremity Assessment: Defer to OT evaluation    Lower Extremity Assessment Lower Extremity Assessment: RLE deficits/detail;LLE deficits/detail RLE Deficits / Details: deferred due to s/p THA; burning of thigh noted RLE Sensation: decreased light touch (full sensation knee and below, decreased sensation to thigh) LLE Deficits / Details: WFL LLE Sensation: WNL LLE Coordination: WNL       Communication   Communication Communication: No apparent difficulties    Cognition Arousal: Alert Behavior During Therapy: WFL for tasks assessed/performed   PT - Cognitive impairments: No apparent impairments                       PT - Cognition Comments: A and O x 4 Following commands: Intact       Cueing Cueing Techniques: Verbal cues     General Comments General comments (skin integrity, edema, etc.): post surgical dressing    Exercises Total Joint Exercises Ankle Circles/Pumps: AROM, Strengthening, Both, Supine, 15 reps Quad Sets: Strengthening, Both, 10 reps, Supine Gluteal Sets: Strengthening, Both, 10 reps, Supine Heel Slides: Strengthening, Right, 10 reps, Supine Hip ABduction/ADduction: AROM, Strengthening, Right, 10 reps, Supine Other Exercises Other Exercises: Patient educated on role of PT in acute care setting, safe mobility and transfers, anterior hip precautions   Assessment/Plan    PT Assessment Patient needs continued PT  services  PT Problem List Decreased strength;Decreased range of motion;Decreased activity tolerance;Decreased mobility;Decreased balance;Impaired sensation;Pain       PT Treatment Interventions DME instruction;Gait training;Stair training;Functional mobility training;Neuromuscular re-education;Balance training;Therapeutic exercise;Therapeutic activities;Cognitive remediation;Patient/family education;Manual techniques    PT Goals (Current goals can be found in the Care Plan section)  Acute Rehab PT Goals Patient Stated Goal: to return home PT Goal Formulation: With patient Time For Goal Achievement: 12/07/23 Potential to Achieve Goals: Fair    Frequency BID     Co-evaluation               AM-PAC PT "6 Clicks" Mobility  Outcome Measure Help needed turning from your back to your side while in a flat bed without using bedrails?: A Little Help needed moving from lying on your back to sitting on the side of a flat bed without using bedrails?: A Little Help needed moving to and from a bed to a chair (including a wheelchair)?: A Little Help needed standing up from a chair using your arms (e.g., wheelchair or bedside chair)?: None Help needed to walk in hospital room?: A Little Help needed climbing 3-5 steps with a railing? : A Lot 6 Click Score: 18    End of Session Equipment Utilized During Treatment: Gait belt Activity Tolerance: Patient tolerated treatment well;Patient limited by pain Patient left:  in bed;with call bell/phone within reach Nurse Communication: Mobility status PT Visit Diagnosis: Unsteadiness on feet (R26.81);Other abnormalities of gait and mobility (R26.89);Muscle weakness (generalized) (M62.81);Difficulty in walking, not elsewhere classified (R26.2);Pain Pain - Right/Left: Right Pain - part of body: Hip    Time: 8295-6213 PT Time Calculation (min) (ACUTE ONLY): 24 min   Charges:   PT Evaluation $PT Eval Moderate Complexity: 1 Mod PT  Treatments $Therapeutic Activity: 8-22 mins PT General Charges $$ ACUTE PT VISIT: 1 Visit         Precious Bard, PT, DPT Physical Therapist - [facility]    11/23/2023, 10:22 AM

## 2023-11-23 NOTE — Discharge Instructions (Signed)

## 2023-11-23 NOTE — Progress Notes (Signed)
 Subjective: 1 Day Post-Op Procedure(s) (LRB): TOTAL HIP ARTHROPLASTY ANTERIOR APPROACH (Right) Patient reports pain as mild.   Patient is well, and has had no acute complaints or problems Denies any CP, SOB, ABD pain. We will continue therapy today.  Plan is to go Home after hospital stay.  Objective: Vital signs in last 24 hours: Temp:  [96.8 F (36 C)-98.2 F (36.8 C)] 97 F (36.1 C) (02/28 0440) Pulse Rate:  [56-85] 61 (02/28 0440) Resp:  [13-22] 16 (02/28 0440) BP: (124-148)/(54-82) 124/54 (02/28 0440) SpO2:  [94 %-100 %] 94 % (02/28 0440) Weight:  [108.4 kg] 108.4 kg (02/27 1139)  Intake/Output from previous day: 02/27 0701 - 02/28 0700 In: 1500 [I.V.:1000; IV Piggyback:500] Out: 550 [Urine:400; Blood:150] Intake/Output this shift: No intake/output data recorded.  Recent Labs    11/23/23 0606  HGB 11.8*   Recent Labs    11/23/23 0606  WBC 11.3*  RBC 3.69*  HCT 35.1*  PLT 127*   Recent Labs    11/23/23 0606  NA 135  K 4.0  CL 105  CO2 24  BUN 15  CREATININE 0.62  GLUCOSE 152*  CALCIUM 8.5*   No results for input(s): "LABPT", "INR" in the last 72 hours.  EXAM General - Patient is Alert, Appropriate, and Oriented Extremity - Neurovascular intact Sensation intact distally Intact pulses distally Dorsiflexion/Plantar flexion intact No cellulitis present Compartment soft Dressing - dressing C/D/I and no drainage Motor Function - intact, moving foot and toes well on exam.   Past Medical History:  Diagnosis Date   AC (acromioclavicular) joint bone spurs    feet   Anxiety    Complication of anesthesia    Diverticulosis of colon (without mention of hemorrhage)    Encounter for long-term (current) use of high-risk medication    Epicondylitis, lateral    right   Gout, unspecified    Headache    History of compression fracture of spine    History of shingles    Hypothyroidism    per pt, was dx in 1978, previosly on Synthroid, but has not  taken it in years   Irritable bowel syndrome    Migraine headache    Migraine-cluster headache syndrome    Morbid obesity (HCC)    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 2016, otherwise no nausea   Psoriatic arthritis mutilans (HCC)    methotrexate   Rosacea    Shingles    Spinal stenosis of lumbar region with neurogenic claudication    Stress fracture of right foot    TMJ (temporomandibular joint disorder)    Unspecified essential hypertension     Assessment/Plan:   1 Day Post-Op Procedure(s) (LRB): TOTAL HIP ARTHROPLASTY ANTERIOR APPROACH (Right) Principal Problem:   S/P total right hip arthroplasty  Estimated body mass index is 41.02 kg/m as calculated from the following:   Height as of this encounter: 5\' 4"  (1.626 m).   Weight as of this encounter: 108.4 kg. Advance diet Up with therapy Pain well-controlled Labs and vital signs are stable Care management to assist with discharge to home  with home health PT today pending safe completion of PT goals.  DVT Prophylaxis - Lovenox, TED hose, and SCDs Weight-Bearing as tolerated to right leg   T. Cranston Neighbor, PA-C [facility] 11/23/2023, 8:08 AM

## 2023-11-23 NOTE — Discharge Summary (Addendum)
 Physician Discharge Summary  Patient ID: Michelle Jordan MRN: [id number] DOB/AGE: [birth year] 68 y.o.  Admit date: 11/22/2023 Discharge date: 11/23/2023  Admission Diagnoses:  S/P total right hip arthroplasty [A21.308]   Discharge Diagnoses: Patient Active Problem List   Diagnosis Date Noted   S/P total right hip arthroplasty 11/22/2023   Tachycardia 12/12/2019   Knee joint replacement status, left 12/11/2019   S/P total knee replacement 09/22/2015   Preoperative examination 09/28/2014   Foot fracture, left 08/18/2014   Migraine 08/18/2014   Lipoma 07/28/2011   Anxiety 07/28/2011   GOUT, UNSPECIFIED 03/09/2010   Pain in limb 07/13/2008   Hyperlipidemia 02/12/2007   OBESITY NOS 02/12/2007   IRITIS 02/12/2007   Essential hypertension 02/12/2007   Diverticulosis of colon 02/12/2007   IBS 02/12/2007   ROSACEA 02/12/2007   PSORIASIS NEC 02/12/2007   Osteoarthritis 02/12/2007    Past Medical History:  Diagnosis Date   AC (acromioclavicular) joint bone spurs    feet   Anxiety    Complication of anesthesia    Diverticulosis of colon (without mention of hemorrhage)    Encounter for long-term (current) use of high-risk medication    Epicondylitis, lateral    right   Gout, unspecified    Headache    History of compression fracture of spine    History of shingles    Hypothyroidism    per pt, was dx in 1978, previosly on Synthroid, but has not taken it in years   Irritable bowel syndrome    Migraine headache    Migraine-cluster headache syndrome    Morbid obesity (HCC)    Nose colonized with MRSA 11/13/2023   a.) pursurgical PCR (+) 11/13/2023 prior to RIGHT THR   Obesity, unspecified    Osteoarthrosis, unspecified whether generalized or localized, unspecified site    knees, cervical spine   Other abnormal glucose    Other and unspecified hyperlipidemia    Other psoriasis    Plantar fasciitis    PONV (postoperative nausea and vomiting)    nausea only with foot surgery  in Jan. 2016, otherwise no nausea   Psoriatic arthritis mutilans (HCC)    methotrexate   Rosacea    Shingles    Spinal stenosis of lumbar region with neurogenic claudication    Stress fracture of right foot    TMJ (temporomandibular joint disorder)    Unspecified essential hypertension      Transfusion: none   Consultants (if any):   Discharged Condition: Improved  Hospital Course: Michelle Jordan is an 68 y.o. female who was admitted 11/22/2023 with a diagnosis of S/P total right hip arthroplasty and went to the operating room on 11/22/2023 and underwent the above named procedures.    Surgeries: Procedure(s): TOTAL HIP ARTHROPLASTY ANTERIOR APPROACH on 11/22/2023 Patient tolerated the surgery well. Taken to PACU where she was stabilized and then transferred to the orthopedic floor.  Started on Lovenox 40 mg q 24 hrs. TEDs and SCDs applied bilaterally. Heels elevated on bed. No evidence of DVT. Negative Homan. Physical therapy started on day #1 for gait training and transfer. OT started day #1 for ADL and assisted devices.  Patient's IV was d/c on day #1. Patient was able to safely and independently complete all PT goals. PT recommending discharge to home.    On post op day #1 patient was stable and ready for discharge to home with HHPT.  Implants: Cup: Trident Tritanium Clusterhole 48/D w/ x2 screws    Liner: Neutral X3 poly 36/D  Stem: Insignia #  5 Std  Head:Biolox Ceramic 36 -2.24mm   She was given perioperative antibiotics:  Anti-infectives (From admission, onward)    Start     Dose/Rate Route Frequency Ordered Stop   11/22/23 1900  ceFAZolin (ANCEF) IVPB 2g/100 mL premix        2 g 200 mL/hr over 30 Minutes Intravenous Every 6 hours 11/22/23 1641 11/23/23 0029   11/22/23 0600  ceFAZolin (ANCEF) IVPB 2g/100 mL premix        2 g 200 mL/hr over 30 Minutes Intravenous On call to O.R. 11/21/23 2216 11/22/23 1308   11/21/23 2230  vancomycin (VANCOCIN) IVPB 1000 mg/200 mL premix         1,000 mg 200 mL/hr over 60 Minutes Intravenous  Once 11/21/23 2216 11/22/23 1246     .  She was given sequential compression devices, early ambulation, and Lovenox TEDs for DVT prophylaxis.  She benefited maximally from the hospital stay and there were no complications.    Recent vital signs:  Vitals:   11/23/23 0007 11/23/23 0440  BP: 126/74 (!) 124/54  Pulse: 72 61  Resp: 16 16  Temp: 98.2 F (36.8 C) (!) 97 F (36.1 C)  SpO2: 96% 94%    Recent laboratory studies:  Lab Results  Component Value Date   HGB 11.8 (L) 11/23/2023   HGB 14.2 11/13/2023   HGB 10.9 (L) 12/14/2019   Lab Results  Component Value Date   WBC 11.3 (H) 11/23/2023   PLT 127 (L) 11/23/2023   Lab Results  Component Value Date   INR 1.0 12/08/2019   Lab Results  Component Value Date   NA 135 11/23/2023   K 4.0 11/23/2023   CL 105 11/23/2023   CO2 24 11/23/2023   BUN 15 11/23/2023   CREATININE 0.62 11/23/2023   GLUCOSE 152 (H) 11/23/2023    Discharge Medications:   Allergies as of 11/23/2023       Reactions   Gabapentin Other (See Comments)   Depression, anxiety, brain fog, insomnia   Etodolac Other (See Comments)   Patient is unsure (cannot remember) reaction type   Isometheptene-dichloral-apap    (Midrin)Caused tingling   Penicillins Other (See Comments)   Can't take due to being on methotrexate    Spironolactone Other (See Comments)   Gout   Statins Other (See Comments)   Calf/leg pain   Amitriptyline Anxiety   moodiness        Medication List     STOP taking these medications    meloxicam 15 MG tablet Commonly known as: MOBIC       TAKE these medications    acetaminophen 500 MG tablet Commonly known as: TYLENOL Take 2 tablets (1,000 mg total) by mouth every 8 (eight) hours. What changed:  how much to take when to take this   adalimumab 40 MG/0.4ML pen Commonly known as: HUMIRA Inject 40 mg into the skin every 14 (fourteen) days. Every other Thursday    ALPRAZolam 0.5 MG tablet Commonly known as: XANAX Take 1 tablet (0.5 mg total) by mouth at bedtime as needed. for sleep What changed:  when to take this reasons to take this additional instructions   Biotin 02725 MCG Tabs Take 1 tablet by mouth daily.   bisacodyl 5 MG EC tablet Commonly known as: DULCOLAX Take 10 mg by mouth daily as needed for moderate constipation.   calcium carbonate 1500 (600 Ca) MG Tabs tablet Commonly known as: OSCAL Take by mouth 2 (two) times daily with a  meal.   capsaicin 0.025 % cream Commonly known as: ZOSTRIX Apply topically 2 (two) times daily.   celecoxib 200 MG capsule Commonly known as: CeleBREX Take 1 capsule (200 mg total) by mouth 2 (two) times daily for 10 days.   chlorhexidine 4 % external liquid Commonly known as: HIBICLENS Apply 15 mLs (1 Application total) topically as directed for 30 doses. Use as directed daily for 5 days every other week for 6 weeks.   clobetasol cream 0.05 % Commonly known as: TEMOVATE Apply 1 application topically 2 (two) times daily as needed (psoriasis).   dicyclomine 20 MG tablet Commonly known as: BENTYL Take 1 tablet by mouth every 6 (six) hours as needed for spasms.   docusate sodium 100 MG capsule Commonly known as: COLACE Take 100 mg by mouth daily.   enoxaparin 40 MG/0.4ML injection Commonly known as: LOVENOX Inject 0.4 mLs (40 mg total) into the skin daily for 14 days.   ezetimibe 10 MG tablet Commonly known as: ZETIA TAKE 1 TABLET (10 MG TOTAL) BY MOUTH EVERY DAY FOR 180 DAYS   folic acid 1 MG tablet Commonly known as: FOLVITE Take 1 mg by mouth at bedtime.   methotrexate 2.5 MG tablet Commonly known as: RHEUMATREX Take 15 mg by mouth every Thursday.   mupirocin ointment 2 % Commonly known as: BACTROBAN Apply small amount to the inside of both nostrils TWICE a day for the next 5 days. What changed: Another medication with the same name was added. Make sure you understand how and  when to take each.   mupirocin ointment 2 % Commonly known as: BACTROBAN Place 1 Application into the nose 2 (two) times daily for 60 doses. Use as directed 2 times daily for 5 days every other week for 6 weeks. What changed: You were already taking a medication with the same name, and this prescription was added. Make sure you understand how and when to take each.   ondansetron 4 MG tablet Commonly known as: ZOFRAN Take 1 tablet (4 mg total) by mouth every 6 (six) hours as needed for nausea.   oxyCODONE 5 MG immediate release tablet Commonly known as: Roxicodone Take 0.5-1 tablets (2.5-5 mg total) by mouth every 8 (eight) hours as needed for breakthrough pain.   Qsymia 7.5-46 MG Cp24 Generic drug: Phentermine-Topiramate Take 1 capsule by mouth daily.   tiZANidine 2 MG tablet Commonly known as: ZANAFLEX Take 2 mg by mouth 2 (two) times daily.   traMADol 50 MG tablet Commonly known as: ULTRAM Take 1 tablet (50 mg total) by mouth every 6 (six) hours as needed for moderate pain (pain score 4-6).   traZODone 100 MG tablet Commonly known as: DESYREL Take 200 mg by mouth at bedtime.   Vitamin D (Ergocalciferol) 1.25 MG (50000 UNIT) Caps capsule Commonly known as: DRISDOL Take 50,000 Units by mouth every Thursday.        Diagnostic Studies: DG HIP UNILAT WITH PELVIS 2-3 VIEWS RIGHT Result Date: 11/22/2023 CLINICAL DATA:  Total right hip arthroplasty anterior approach. Intraoperative fluoroscopy. EXAM: DG HIP (WITH OR WITHOUT PELVIS) 2-3V RIGHT COMPARISON:  CT abdomen and pelvis 08/10/2022 FINDINGS: Images were performed intraoperatively without the presence of a radiologist. Interval total right hip arthroplasty. No hardware complication is seen. Total fluoroscopy images: 3 Total fluoroscopy time: 24 seconds Total dose: Radiation Exposure Index (as provided by the fluoroscopic device): 3.45 mGy air Kerma Please see intraoperative findings for further detail. IMPRESSION:  Intraoperative fluoroscopy for total right hip arthroplasty. Electronically  Signed   By: Neita Garnet M.D.   On: 11/22/2023 17:37   DG C-Arm 1-60 Min-No Report Result Date: 11/22/2023 Fluoroscopy was utilized by the requesting physician.  No radiographic interpretation.   DG C-Arm 1-60 Min-No Report Result Date: 11/22/2023 Fluoroscopy was utilized by the requesting physician.  No radiographic interpretation.   MM 3D SCREENING MAMMOGRAM BILATERAL BREAST Result Date: 10/31/2023 CLINICAL DATA:  Screening. EXAM: DIGITAL SCREENING BILATERAL MAMMOGRAM WITH TOMOSYNTHESIS AND CAD TECHNIQUE: Bilateral screening digital craniocaudal and mediolateral oblique mammograms were obtained. Bilateral screening digital breast tomosynthesis was performed. The images were evaluated with computer-aided detection. COMPARISON:  Previous exam(s). ACR Breast Density Category a: The breasts are almost entirely fatty. FINDINGS: There are no findings suspicious for malignancy. IMPRESSION: No mammographic evidence of malignancy. A result letter of this screening mammogram will be mailed directly to the patient. RECOMMENDATION: Screening mammogram in one year. (Code:[id number]) BI-RADS CATEGORY  1: Negative. Electronically Signed   By: Harmon Pier M.D.   On: 10/31/2023 16:16    Disposition:      Follow-up Information     Evon Slack, PA-C Follow up in 2 week(s).   Specialties: Orthopedic Surgery, Emergency Medicine Contact information: [address] [place] [address] [phone]                  Signed: Evon Slack 11/23/2023, 8:16 AM

## 2023-11-23 NOTE — Plan of Care (Signed)
Patient progressing towards discharge.

## 2023-11-28 DIAGNOSIS — M5441 Lumbago with sciatica, right side: Secondary | ICD-10-CM | POA: Diagnosis not present

## 2023-11-28 DIAGNOSIS — M25511 Pain in right shoulder: Secondary | ICD-10-CM | POA: Diagnosis not present

## 2023-11-28 DIAGNOSIS — M25552 Pain in left hip: Secondary | ICD-10-CM | POA: Diagnosis not present

## 2023-11-28 DIAGNOSIS — Z96641 Presence of right artificial hip joint: Secondary | ICD-10-CM | POA: Diagnosis not present

## 2023-11-28 DIAGNOSIS — M5442 Lumbago with sciatica, left side: Secondary | ICD-10-CM | POA: Diagnosis not present

## 2023-11-28 DIAGNOSIS — L409 Psoriasis, unspecified: Secondary | ICD-10-CM | POA: Diagnosis not present

## 2023-11-28 DIAGNOSIS — L4050 Arthropathic psoriasis, unspecified: Secondary | ICD-10-CM | POA: Diagnosis not present

## 2023-11-28 DIAGNOSIS — M519 Unspecified thoracic, thoracolumbar and lumbosacral intervertebral disc disorder: Secondary | ICD-10-CM | POA: Diagnosis not present

## 2023-11-28 DIAGNOSIS — Z471 Aftercare following joint replacement surgery: Secondary | ICD-10-CM | POA: Diagnosis not present

## 2023-11-28 DIAGNOSIS — G8929 Other chronic pain: Secondary | ICD-10-CM | POA: Diagnosis not present

## 2023-12-11 DIAGNOSIS — Z96641 Presence of right artificial hip joint: Secondary | ICD-10-CM | POA: Diagnosis not present

## 2023-12-11 DIAGNOSIS — G8929 Other chronic pain: Secondary | ICD-10-CM | POA: Diagnosis not present

## 2023-12-11 DIAGNOSIS — L409 Psoriasis, unspecified: Secondary | ICD-10-CM | POA: Diagnosis not present

## 2023-12-11 DIAGNOSIS — M25511 Pain in right shoulder: Secondary | ICD-10-CM | POA: Diagnosis not present

## 2023-12-11 DIAGNOSIS — M25552 Pain in left hip: Secondary | ICD-10-CM | POA: Diagnosis not present

## 2023-12-11 DIAGNOSIS — L4050 Arthropathic psoriasis, unspecified: Secondary | ICD-10-CM | POA: Diagnosis not present

## 2023-12-11 DIAGNOSIS — Z471 Aftercare following joint replacement surgery: Secondary | ICD-10-CM | POA: Diagnosis not present

## 2024-01-08 DIAGNOSIS — Z96641 Presence of right artificial hip joint: Secondary | ICD-10-CM | POA: Diagnosis not present

## 2024-01-21 DIAGNOSIS — M5416 Radiculopathy, lumbar region: Secondary | ICD-10-CM | POA: Diagnosis not present

## 2024-01-21 DIAGNOSIS — M5442 Lumbago with sciatica, left side: Secondary | ICD-10-CM | POA: Diagnosis not present

## 2024-01-21 DIAGNOSIS — M5441 Lumbago with sciatica, right side: Secondary | ICD-10-CM | POA: Diagnosis not present

## 2024-01-21 DIAGNOSIS — M25552 Pain in left hip: Secondary | ICD-10-CM | POA: Diagnosis not present

## 2024-01-21 DIAGNOSIS — G8929 Other chronic pain: Secondary | ICD-10-CM | POA: Diagnosis not present

## 2024-01-24 DIAGNOSIS — M5416 Radiculopathy, lumbar region: Secondary | ICD-10-CM | POA: Diagnosis not present

## 2024-02-07 DIAGNOSIS — M5442 Lumbago with sciatica, left side: Secondary | ICD-10-CM | POA: Diagnosis not present

## 2024-02-07 DIAGNOSIS — M25511 Pain in right shoulder: Secondary | ICD-10-CM | POA: Diagnosis not present

## 2024-02-07 DIAGNOSIS — G8929 Other chronic pain: Secondary | ICD-10-CM | POA: Diagnosis not present

## 2024-02-07 DIAGNOSIS — M5416 Radiculopathy, lumbar region: Secondary | ICD-10-CM | POA: Diagnosis not present

## 2024-02-07 DIAGNOSIS — M5441 Lumbago with sciatica, right side: Secondary | ICD-10-CM | POA: Diagnosis not present

## 2024-03-18 DIAGNOSIS — H2010 Chronic iridocyclitis, unspecified eye: Secondary | ICD-10-CM | POA: Diagnosis not present

## 2024-03-18 DIAGNOSIS — L4050 Arthropathic psoriasis, unspecified: Secondary | ICD-10-CM | POA: Diagnosis not present

## 2024-03-18 DIAGNOSIS — Z79899 Other long term (current) drug therapy: Secondary | ICD-10-CM | POA: Diagnosis not present

## 2024-04-07 DIAGNOSIS — Z0000 Encounter for general adult medical examination without abnormal findings: Secondary | ICD-10-CM | POA: Diagnosis not present

## 2024-04-07 DIAGNOSIS — E7800 Pure hypercholesterolemia, unspecified: Secondary | ICD-10-CM | POA: Diagnosis not present

## 2024-04-07 DIAGNOSIS — Z6841 Body Mass Index (BMI) 40.0 and over, adult: Secondary | ICD-10-CM | POA: Diagnosis not present

## 2024-04-07 DIAGNOSIS — F411 Generalized anxiety disorder: Secondary | ICD-10-CM | POA: Diagnosis not present

## 2024-04-07 DIAGNOSIS — L4050 Arthropathic psoriasis, unspecified: Secondary | ICD-10-CM | POA: Diagnosis not present

## 2024-04-07 DIAGNOSIS — G4700 Insomnia, unspecified: Secondary | ICD-10-CM | POA: Diagnosis not present

## 2024-04-07 DIAGNOSIS — E778 Other disorders of glycoprotein metabolism: Secondary | ICD-10-CM | POA: Diagnosis not present

## 2024-04-07 DIAGNOSIS — M1611 Unilateral primary osteoarthritis, right hip: Secondary | ICD-10-CM | POA: Diagnosis not present

## 2024-04-14 DIAGNOSIS — E8809 Other disorders of plasma-protein metabolism, not elsewhere classified: Secondary | ICD-10-CM | POA: Diagnosis not present

## 2024-04-14 DIAGNOSIS — L4050 Arthropathic psoriasis, unspecified: Secondary | ICD-10-CM | POA: Diagnosis not present

## 2024-04-14 DIAGNOSIS — E7800 Pure hypercholesterolemia, unspecified: Secondary | ICD-10-CM | POA: Diagnosis not present

## 2024-04-14 DIAGNOSIS — M150 Primary generalized (osteo)arthritis: Secondary | ICD-10-CM | POA: Diagnosis not present

## 2024-04-14 DIAGNOSIS — R300 Dysuria: Secondary | ICD-10-CM | POA: Diagnosis not present

## 2024-04-14 DIAGNOSIS — Z789 Other specified health status: Secondary | ICD-10-CM | POA: Diagnosis not present

## 2024-04-14 DIAGNOSIS — L409 Psoriasis, unspecified: Secondary | ICD-10-CM | POA: Diagnosis not present

## 2024-04-14 DIAGNOSIS — Z6839 Body mass index (BMI) 39.0-39.9, adult: Secondary | ICD-10-CM | POA: Diagnosis not present

## 2024-05-12 DIAGNOSIS — M5416 Radiculopathy, lumbar region: Secondary | ICD-10-CM | POA: Diagnosis not present

## 2024-05-16 DIAGNOSIS — Z96641 Presence of right artificial hip joint: Secondary | ICD-10-CM | POA: Diagnosis not present

## 2024-05-28 DIAGNOSIS — L918 Other hypertrophic disorders of the skin: Secondary | ICD-10-CM | POA: Diagnosis not present

## 2024-05-28 DIAGNOSIS — L821 Other seborrheic keratosis: Secondary | ICD-10-CM | POA: Diagnosis not present

## 2024-05-28 DIAGNOSIS — D225 Melanocytic nevi of trunk: Secondary | ICD-10-CM | POA: Diagnosis not present

## 2024-05-28 DIAGNOSIS — D485 Neoplasm of uncertain behavior of skin: Secondary | ICD-10-CM | POA: Diagnosis not present

## 2024-05-28 DIAGNOSIS — L570 Actinic keratosis: Secondary | ICD-10-CM | POA: Diagnosis not present

## 2024-05-28 DIAGNOSIS — L578 Other skin changes due to chronic exposure to nonionizing radiation: Secondary | ICD-10-CM | POA: Diagnosis not present

## 2024-05-28 DIAGNOSIS — L249 Irritant contact dermatitis, unspecified cause: Secondary | ICD-10-CM | POA: Diagnosis not present

## 2024-05-28 DIAGNOSIS — Z86018 Personal history of other benign neoplasm: Secondary | ICD-10-CM | POA: Diagnosis not present

## 2024-05-29 DIAGNOSIS — G8929 Other chronic pain: Secondary | ICD-10-CM | POA: Diagnosis not present

## 2024-05-29 DIAGNOSIS — M5441 Lumbago with sciatica, right side: Secondary | ICD-10-CM | POA: Diagnosis not present

## 2024-05-29 DIAGNOSIS — M25511 Pain in right shoulder: Secondary | ICD-10-CM | POA: Diagnosis not present

## 2024-05-29 DIAGNOSIS — M25552 Pain in left hip: Secondary | ICD-10-CM | POA: Diagnosis not present

## 2024-05-29 DIAGNOSIS — M5416 Radiculopathy, lumbar region: Secondary | ICD-10-CM | POA: Diagnosis not present

## 2024-05-29 DIAGNOSIS — M5442 Lumbago with sciatica, left side: Secondary | ICD-10-CM | POA: Diagnosis not present

## 2024-07-21 DIAGNOSIS — H2010 Chronic iridocyclitis, unspecified eye: Secondary | ICD-10-CM | POA: Diagnosis not present

## 2024-07-21 DIAGNOSIS — Z79899 Other long term (current) drug therapy: Secondary | ICD-10-CM | POA: Diagnosis not present

## 2024-07-21 DIAGNOSIS — L4050 Arthropathic psoriasis, unspecified: Secondary | ICD-10-CM | POA: Diagnosis not present

## 2024-08-04 DIAGNOSIS — M5416 Radiculopathy, lumbar region: Secondary | ICD-10-CM | POA: Diagnosis not present

## 2024-08-04 DIAGNOSIS — M5441 Lumbago with sciatica, right side: Secondary | ICD-10-CM | POA: Diagnosis not present

## 2024-08-04 DIAGNOSIS — M25511 Pain in right shoulder: Secondary | ICD-10-CM | POA: Diagnosis not present

## 2024-08-04 DIAGNOSIS — G8929 Other chronic pain: Secondary | ICD-10-CM | POA: Diagnosis not present

## 2024-08-04 DIAGNOSIS — M5442 Lumbago with sciatica, left side: Secondary | ICD-10-CM | POA: Diagnosis not present

## 2024-08-14 DIAGNOSIS — M5416 Radiculopathy, lumbar region: Secondary | ICD-10-CM | POA: Diagnosis not present

## 2024-08-29 DIAGNOSIS — M5441 Lumbago with sciatica, right side: Secondary | ICD-10-CM | POA: Diagnosis not present

## 2024-08-29 DIAGNOSIS — M25552 Pain in left hip: Secondary | ICD-10-CM | POA: Diagnosis not present

## 2024-08-29 DIAGNOSIS — M25511 Pain in right shoulder: Secondary | ICD-10-CM | POA: Diagnosis not present

## 2024-08-29 DIAGNOSIS — M5442 Lumbago with sciatica, left side: Secondary | ICD-10-CM | POA: Diagnosis not present

## 2024-08-29 DIAGNOSIS — M5416 Radiculopathy, lumbar region: Secondary | ICD-10-CM | POA: Diagnosis not present

## 2024-08-29 DIAGNOSIS — G8929 Other chronic pain: Secondary | ICD-10-CM | POA: Diagnosis not present

## 2024-09-23 NOTE — Addendum Note (Signed)
 Encounter addended by: Quierra Silverio L on: 09/23/2024 10:25 AM  Actions taken: Imaging Exam ended

## 2024-09-23 NOTE — Addendum Note (Signed)
 Encounter addended by: Charlene Detter L on: 09/23/2024 10:24 AM  Actions taken: Imaging Exam ended
